# Patient Record
Sex: Female | Born: 1955 | Race: White | Hispanic: No | Marital: Married | Smoking: Never smoker
Health system: Southern US, Community
[De-identification: ages and names within clinical notes are randomized; demographics above are authoritative.]

## PROBLEM LIST (undated history)

## (undated) DIAGNOSIS — R519 Headache, unspecified: Secondary | ICD-10-CM

## (undated) DIAGNOSIS — T7840XA Allergy, unspecified, initial encounter: Secondary | ICD-10-CM

## (undated) DIAGNOSIS — E119 Type 2 diabetes mellitus without complications: Secondary | ICD-10-CM

## (undated) DIAGNOSIS — Z01419 Encounter for gynecological examination (general) (routine) without abnormal findings: Secondary | ICD-10-CM

## (undated) DIAGNOSIS — I10 Essential (primary) hypertension: Secondary | ICD-10-CM

## (undated) DIAGNOSIS — M545 Low back pain, unspecified: Secondary | ICD-10-CM

## (undated) DIAGNOSIS — C4491 Basal cell carcinoma of skin, unspecified: Secondary | ICD-10-CM

## (undated) DIAGNOSIS — E782 Mixed hyperlipidemia: Secondary | ICD-10-CM

## (undated) DIAGNOSIS — M1990 Unspecified osteoarthritis, unspecified site: Secondary | ICD-10-CM

## (undated) DIAGNOSIS — R51 Headache: Secondary | ICD-10-CM

## (undated) DIAGNOSIS — Z8701 Personal history of pneumonia (recurrent): Secondary | ICD-10-CM

## (undated) HISTORY — PX: OTHER SURGICAL HISTORY: SHX169

## (undated) HISTORY — DX: Mixed hyperlipidemia: E78.2

## (undated) HISTORY — DX: Personal history of pneumonia (recurrent): Z87.01

## (undated) HISTORY — PX: CHOLECYSTECTOMY: SHX55

## (undated) HISTORY — DX: Basal cell carcinoma of skin, unspecified: C44.91

## (undated) HISTORY — PX: APPENDECTOMY: SHX54

## (undated) HISTORY — DX: Allergy, unspecified, initial encounter: T78.40XA

## (undated) HISTORY — PX: TONSILLECTOMY: SUR1361

## (undated) HISTORY — DX: Low back pain: M54.5

## (undated) HISTORY — DX: Essential (primary) hypertension: I10

## (undated) HISTORY — DX: Type 2 diabetes mellitus without complications: E11.9

## (undated) HISTORY — DX: Low back pain, unspecified: M54.50

## (undated) HISTORY — DX: Unspecified osteoarthritis, unspecified site: M19.90

---
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
## 1991-09-01 HISTORY — PX: TUBAL LIGATION: SHX77

## 2003-09-01 HISTORY — PX: THYROID SURGERY: SHX805

## 2010-08-31 HISTORY — PX: PLANTAR FASCIA SURGERY: SHX746

## 2013-08-22 ENCOUNTER — Ambulatory Visit (INDEPENDENT_AMBULATORY_CARE_PROVIDER_SITE_OTHER): Payer: BC Managed Care – HMO | Admitting: Obstetrics & Gynecology

## 2013-08-22 ENCOUNTER — Encounter: Payer: Self-pay | Admitting: Obstetrics & Gynecology

## 2013-08-22 VITALS — BP 114/76 | HR 69 | Resp 16 | Ht 67.0 in | Wt 203.0 lb

## 2013-08-22 DIAGNOSIS — Z01419 Encounter for gynecological examination (general) (routine) without abnormal findings: Secondary | ICD-10-CM | POA: Insufficient documentation

## 2013-08-22 DIAGNOSIS — E119 Type 2 diabetes mellitus without complications: Secondary | ICD-10-CM | POA: Insufficient documentation

## 2013-08-22 DIAGNOSIS — E1159 Type 2 diabetes mellitus with other circulatory complications: Secondary | ICD-10-CM

## 2013-08-22 DIAGNOSIS — I152 Hypertension secondary to endocrine disorders: Secondary | ICD-10-CM

## 2013-08-22 DIAGNOSIS — I10 Essential (primary) hypertension: Secondary | ICD-10-CM | POA: Insufficient documentation

## 2013-08-22 HISTORY — DX: Encounter for gynecological examination (general) (routine) without abnormal findings: Z01.419

## 2013-08-22 NOTE — Patient Instructions (Signed)
Mammography  Mammography is an X-ray of the breasts to look for changes that are not normal. The X-ray image is called a mammogram. This procedure can screen for breast cancer, can detect cancer early, and can diagnose cancer.   LET YOUR CAREGIVER KNOW ABOUT:  · Breast implants.  · Previous breast disease, biopsy, or surgery.  · If you are breastfeeding.  · Medicines taken, including vitamins, herbs, eyedrops, over-the-counter medicines, and creams.  · Use of steroids (by mouth or creams).  · Possibility of pregnancy, if this applies.  RISKS AND COMPLICATIONS  · Exposure to radiation, but at very low levels.  · The results may be misinterpreted.  · The results may not be accurate.  · Mammography may lead to further tests.  · Mammography may not catch certain cancers.  BEFORE THE PROCEDURE  · Schedule your test about 7 days after your menstrual period. This is when your breasts are the least tender and have signs of hormone changes.  · If you have had a mammography done at a different facility in the past, get the mammogram X-rays or have them sent to your current exam facility in order to compare them.  · Wash your breasts and under your arms the day of the test.  · Do not wear deodorants, perfumes, or powders anywhere on your body.  · Wear clothes that you can change in and out of easily.  PROCEDURE  Relax as much as possible during the test. Any discomfort during the test will be very brief. The test should take less than 30 minutes. The following will happen:  · You will undress from the waist up and put on a gown.  · You will stand in front of the X-ray machine.  · Each breast will be placed between 2 plastic or glass plates. The plates will compress your breast for a few seconds.  · X-rays will be taken from different angles of the breast.  AFTER THE PROCEDURE  · The mammogram will be examined.  · Depending on the quality of the images, you may need to repeat certain parts of the test.  · Ask when your test  results will be ready. Make sure you get your test results.  · You may resume normal activities.  Document Released: 08/14/2000 Document Revised: 11/09/2011 Document Reviewed: 06/07/2011  Exit[employer]® Patient Information ©2014 [employer].

## 2013-08-22 NOTE — Progress Notes (Signed)
  Subjective:    Lauren Shannon is a 57 y.o. female who presents for an annual exam. The patient has no complaints today. The patient is sexually active. GYN screening history: last pap: was normal. The patient wears seatbelts: yes. The patient participates in regular exercise: not asked. Has the patient ever been transfused or tattooed?: no. The patient reports that there is not domestic violence in her life.   Menstrual History: OB History   Grav Para Term Preterm Abortions TAB SAB Ect Mult Living   4    2 2    2        No LMP recorded. Patient is postmenopausal.    The following portions of the patient's history were reviewed and updated as appropriate: allergies, current medications, past family history, past medical history, past social history, past surgical history and problem list.  Review of Systems A comprehensive review of systems was negative.    Objective:    BP 114/76  Pulse 69  Resp 16  Ht 5\' 7"  (1.702 m)  Wt 203 lb (92.08 kg)  BMI 31.79 kg/m2  General Appearance:    Alert, cooperative, no distress, appears stated age  Head:    Normocephalic, without obvious abnormality, atraumatic        Nose:   Nares normal, septum midline, mucosa normal, no drainage    or sinus tenderness  Throat:   Lips, mucosa, and tongue normal; teeth and gums normal  Neck:   Supple, symmetrical, trachea midline, no adenopathy;    thyroid:  no enlargement/tenderness/nodules; no carotid   bruit or JVD  Back:     Symmetric, no curvature, ROM normal, no CVA tenderness  Lungs:     Clear to auscultation bilaterally, respirations unlabored  Chest Wall:    No tenderness or deformity   Heart:    Regular rate and rhythm, S1 and S2 normal, no murmur, rub   or gallop  Breast Exam:    No tenderness, masses, or nipple abnormality  Abdomen:     Soft, non-tender, bowel sounds active all four quadrants,    no masses, no organomegaly  Genitalia:    Normal female without lesion, discharge or tenderness    Extremities:   Extremities normal, atraumatic, no cyanosis or edema  Pulses:   2+ and symmetric all extremities  Skin:   Skin color, texture, turgor normal, no rashes or lesions  Lymph nodes:   Cervical, supraclavicular, and axillary nodes normal     .    Assessment:    Healthy female exam.    Plan:     All questions answered. Mammogram. 4 months. Pap in 4 years.Normal pap and

## 2013-08-31 HISTORY — PX: OTHER SURGICAL HISTORY: SHX169

## 2014-07-02 ENCOUNTER — Encounter: Payer: Self-pay | Admitting: Obstetrics & Gynecology

## 2014-08-14 ENCOUNTER — Encounter: Payer: Self-pay | Admitting: *Deleted

## 2014-09-13 ENCOUNTER — Encounter: Payer: Self-pay | Admitting: Cardiology

## 2014-09-14 ENCOUNTER — Encounter: Payer: Self-pay | Admitting: Cardiology

## 2014-09-14 ENCOUNTER — Ambulatory Visit (INDEPENDENT_AMBULATORY_CARE_PROVIDER_SITE_OTHER): Payer: BC Managed Care – PPO | Admitting: Cardiology

## 2014-09-14 VITALS — BP 104/70 | HR 55 | Ht 65.0 in | Wt 190.0 lb

## 2014-09-14 DIAGNOSIS — E782 Mixed hyperlipidemia: Secondary | ICD-10-CM | POA: Insufficient documentation

## 2014-09-14 DIAGNOSIS — I493 Ventricular premature depolarization: Secondary | ICD-10-CM | POA: Insufficient documentation

## 2014-09-14 DIAGNOSIS — R9431 Abnormal electrocardiogram [ECG] [EKG]: Secondary | ICD-10-CM

## 2014-09-14 DIAGNOSIS — E119 Type 2 diabetes mellitus without complications: Secondary | ICD-10-CM

## 2014-09-14 DIAGNOSIS — I499 Cardiac arrhythmia, unspecified: Secondary | ICD-10-CM | POA: Insufficient documentation

## 2014-09-14 DIAGNOSIS — I10 Essential (primary) hypertension: Secondary | ICD-10-CM

## 2014-09-14 DIAGNOSIS — Z136 Encounter for screening for cardiovascular disorders: Secondary | ICD-10-CM

## 2014-09-14 NOTE — Patient Instructions (Signed)
Your physician recommends that you schedule a follow-up appointment in: to be determined after tests. We will call you with results     Your physician has requested that you have an echocardiogram. Echocardiography is a painless test that uses sound waves to create images of your heart. It provides your doctor with information about the size and shape of your heart and how well your heart's chambers and valves are working. This procedure takes approximately one hour. There are no restrictions for this procedure.      Your physician has requested that you have an exercise tolerance test. For further information please visit [email or web address]. Please also follow instruction sheet, as given.        Thank you for choosing [facility] !

## 2014-09-14 NOTE — Assessment & Plan Note (Signed)
Followed by primary care. She is currently managing this via diet.

## 2014-09-14 NOTE — Assessment & Plan Note (Signed)
Noted on ECG, intermittent sense of heart skip over the last few years, but no definite associated dizziness or syncope. We talked about trying to limit caffeine somewhat to see if this helps. She underwent a remote evaluation greater than 10 years ago for ischemic heart disease, reportedly reassuring, although no follow-up testing since that time. She has diabetes mellitus, hypertension, and hyperlipidemia. Plan at this time is to obtain a GXT, mainly to evaluate for suppression of PVCs with exercise. An echocardiogram will also be obtained to exclude cardiomyopathy. We will follow her with the results.

## 2014-09-14 NOTE — Progress Notes (Signed)
Reason for visit: Irregular heartbeat, PVCs  Clinical Summary Ms. Kolodziejski is a 59 y.o.female referred for cardiology consultation by Mr. Hepler PA-C with [place]. She reports a history of irregular heartbeat, sometimes feels a brief skip when she is quiet and still, present over the last 2-3 years as best she can recall. Heart rate irregularity was noted on a recent routine examination in December 2015 with reported documentation of ventricular bigeminy (ECG not available). She denies any specific exertional chest tightness or unusual, limiting shortness of breath. Also no history of syncope. She states that occasionally she has a brief "unsteadiness" when she is walking, but does not associate this with lightheadedness or palpitations. ECG done today in the office shows sinus rhythm at 72 bpm with occasional PVCs.  Lab work from December 2015 showed BUN 18, creatinine 0.7, potassium 4.6, hemoglobin 14.4, platelets 238, TSH 3.0, hemoglobin A1c 5.9.  She reports no known history of cardiomyopathy or ischemic heart disease. She recalls undergoing a stress test approximately 11 years ago when she lived in [place]. Based on this she ultimately underwent cardiology evaluation and had a heart catheterization at [facility] that was reportedly normal.  She does not take any stimulant medications or supplements. She states that she drinks 3 cups of coffee a day, some tea. She has been trying to lose weight over the last few years. No regular exercise regimen.   No Known Allergies  Current Outpatient Prescriptions  Medication Sig Dispense Refill  . aspirin 81 MG tablet Take 81 mg by mouth daily.    . calcium carbonate (OS-CAL - DOSED IN MG OF ELEMENTAL CALCIUM) 1250 MG tablet Take 1 tablet by mouth daily with breakfast.    . cholecalciferol (VITAMIN D) 1000 UNITS tablet Take 1,000 Units by mouth daily.    Marland Kitchen LIFESCAN FINEPOINT LANCETS MISC     . losartan (COZAAR) 50 MG tablet     .  lovastatin (MEVACOR) 20 MG tablet     . Magnesium 400 MG CAPS Take 400 mg by mouth daily.    . Multiple Vitamin (MULTIVITAMIN) capsule Take 1 capsule by mouth daily.    . ONE TOUCH ULTRA TEST test strip     . RESTASIS 0.05 % ophthalmic emulsion      No current facility-administered medications for this visit.    Past Medical History  Diagnosis Date  . Low back pain   . History of pneumonia 1963  . Mixed hyperlipidemia   . Essential hypertension   . Type 2 diabetes mellitus     Past Surgical History  Procedure Laterality Date  . Thyroid surgery  2005  . Uterine polyp removal    . Tubal ligation  1993  . Cholecystectomy    . Appendectomy      Family History  Problem Relation Age of Onset  . Diabetes Mother   . Hypertension Mother   . Heart disease Father   . Heart disease Brother     Social History Ms. Gillette reports that she has never smoked. She does not have any smokeless tobacco history on file. Ms. Schey reports that she does not drink alcohol.  Review of Systems Complete review of systems negative except as otherwise outlined in the clinical summary and also the following. No fevers or chills. Stable appetite. No orthopnea or PND. No claudication. Sometimes has ankle edema.  Physical Examination Filed Vitals:   09/14/14 0839  BP: 104/70  Pulse: 55   Filed Weights  09/14/14 0839  Weight: 190 lb (86.183 kg)   Overweight woman, appears comfortable at rest. HEENT: Conjunctiva and lids normal, oropharynx clear. Neck: Supple, no elevated JVP or carotid bruits, no thyromegaly. Lungs: Clear to auscultation, nonlabored breathing at rest. Cardiac: Regular rate and rhythm with occasional ectopic beat, no S3 or significant systolic murmur, no pericardial rub. Abdomen: Soft, nontender, bowel sounds present, no guarding or rebound. Extremities: No pitting edema, distal pulses 2+. Skin: Warm and dry. Musculoskeletal: No kyphosis. Neuropsychiatric: Alert and  oriented x3, affect grossly appropriate.   Problem List and Plan   PVC's (premature ventricular contractions) Noted on ECG, intermittent sense of heart skip over the last few years, but no definite associated dizziness or syncope. We talked about trying to limit caffeine somewhat to see if this helps. She underwent a remote evaluation greater than 10 years ago for ischemic heart disease, reportedly reassuring, although no follow-up testing since that time. She has diabetes mellitus, hypertension, and hyperlipidemia. Plan at this time is to obtain a GXT, mainly to evaluate for suppression of PVCs with exercise. An echocardiogram will also be obtained to exclude cardiomyopathy. We will follow her with the results.   Essential hypertension Blood pressure is normal today. She is on Cozaar.   Type 2 diabetes mellitus Followed by primary care. She is currently managing this via diet.   Mixed hyperlipidemia She is on Mevacor.     Satira Sark, M.D., F.A.C.C.

## 2014-09-14 NOTE — Assessment & Plan Note (Signed)
She is on Mevacor.

## 2014-09-14 NOTE — Assessment & Plan Note (Signed)
Blood pressure is normal today. She is on Cozaar.

## 2014-09-16 ENCOUNTER — Encounter: Payer: Self-pay | Admitting: Cardiology

## 2014-09-20 ENCOUNTER — Ambulatory Visit (HOSPITAL_COMMUNITY)
Admission: RE | Admit: 2014-09-20 | Discharge: 2014-09-20 | Disposition: A | Discharge status: Home / Self Care | Payer: BC Managed Care – PPO | Source: Ambulatory Visit | Attending: Cardiology | Admitting: Cardiology

## 2014-09-20 ENCOUNTER — Ambulatory Visit (HOSPITAL_COMMUNITY)
Admission: RE | Admit: 2014-09-20 | Discharge: 2014-09-20 | Disposition: A | Discharge status: Home / Self Care | Payer: BLUE CROSS/BLUE SHIELD | Source: Ambulatory Visit | Attending: Cardiology | Admitting: Cardiology

## 2014-09-20 ENCOUNTER — Encounter (HOSPITAL_COMMUNITY): Payer: Self-pay

## 2014-09-20 ENCOUNTER — Telehealth: Payer: Self-pay

## 2014-09-20 ENCOUNTER — Encounter: Payer: Self-pay | Admitting: Cardiology

## 2014-09-20 DIAGNOSIS — E785 Hyperlipidemia, unspecified: Secondary | ICD-10-CM | POA: Diagnosis not present

## 2014-09-20 DIAGNOSIS — I10 Essential (primary) hypertension: Secondary | ICD-10-CM | POA: Insufficient documentation

## 2014-09-20 DIAGNOSIS — I493 Ventricular premature depolarization: Secondary | ICD-10-CM | POA: Insufficient documentation

## 2014-09-20 DIAGNOSIS — E119 Type 2 diabetes mellitus without complications: Secondary | ICD-10-CM | POA: Diagnosis not present

## 2014-09-20 DIAGNOSIS — R9431 Abnormal electrocardiogram [ECG] [EKG]: Secondary | ICD-10-CM

## 2014-09-20 DIAGNOSIS — I4949 Other premature depolarization: Secondary | ICD-10-CM

## 2014-09-20 NOTE — Telephone Encounter (Signed)
-----   Message from Satira Sark, MD sent at 09/20/2014 11:57 AM EST ----- Please see the GXT report (under documentation encounter). Negative for ischemia and her PVCs resolved at higher heart rates. Overall reassuring. Doubt that we need to pursue further workup at this time.

## 2014-09-20 NOTE — Telephone Encounter (Signed)
Pt notified of results,copy to pcp

## 2014-09-20 NOTE — Progress Notes (Signed)
  Echocardiogram 2D Echocardiogram has been performed.  [place], Madill 09/20/2014, 9:14 AM

## 2014-09-20 NOTE — Progress Notes (Signed)
Stress Lab Nurses Notes - Lauren Shannon 09/20/2014 Reason for doing test: Irregular heartbeat, PVCs Type of test: Regular GTX Nurse performing test: Gerrit Halls, RN Nuclear Medicine Tech: Not Applicable Echo Tech: Not Applicable MD performing test: S. McDowell/K.Purcell Nails NP Family MD: Michaela Corner Test explained and consent signed: Yes.   IV started: No IV started Symptoms: fatigue Treatment/Intervention: None Reason test stopped: fatigue After recovery IV was: NA Patient to return to Nuc. Med at : NA Patient discharged: Home Patient's Condition upon discharge was: stable Comments: During test peak BP 170/75 & HR 166.  Recovery BP 113/74 & HR 92.  Symptoms resolved in recovery. [name] T

## 2014-09-20 NOTE — Progress Notes (Signed)
Stress Lab Nurses Notes - Jeny Nield 09/20/2014 Reason for doing test: Irregular heartbeat, PVCs Type of test: Regular GTX Nurse performing test: Gerrit Halls, RN Nuclear Medicine Tech: Not Applicable Echo Tech: Not Applicable MD performing test: S. McDowell/K.Purcell Nails NP Family MD: Michaela Corner Test explained and consent signed: Yes.  IV started: No IV started Symptoms: Fatigue Treatment/Intervention: None Reason test stopped: Fatigue After recovery IV was: NA Patient to return to Nuc. Med at : NA Patient discharged: Home Patient's Condition upon discharge was: Stable Comments: During test peak BP 170/75 & HR 166. Recovery BP 113/74 & HR 92. Symptoms resolved in recovery. [name] T   Attending note:  Patient exercised on Bruce protocol for 6:04 achieving 7.2 METS, peak heart rate 166 BPM which was 103% MPHR. Peak blood pressure 170/75. No chest pain reported. Baseline tracing showed NSR with two PVCs. There were occasional to frequent PVCs in stage 1 that resolved in stage 2 and then recurred in recovery. No sustained arrhythmias. No diagnostic ST segment abnormalities. Low risk Duke treadmill score of 6.  Satira Sark, M.D., F.A.C.C.

## 2014-09-27 ENCOUNTER — Ambulatory Visit: Payer: Self-pay | Admitting: Cardiovascular Disease

## 2015-01-17 ENCOUNTER — Other Ambulatory Visit (HOSPITAL_COMMUNITY)
Admission: RE | Admit: 2015-01-17 | Discharge: 2015-01-17 | Disposition: A | Discharge status: Home / Self Care | Payer: 59 | Source: Ambulatory Visit | Attending: Obstetrics & Gynecology | Admitting: Obstetrics & Gynecology

## 2015-01-17 ENCOUNTER — Encounter: Payer: Self-pay | Admitting: Obstetrics & Gynecology

## 2015-01-17 ENCOUNTER — Ambulatory Visit (INDEPENDENT_AMBULATORY_CARE_PROVIDER_SITE_OTHER): Payer: 59 | Admitting: Obstetrics & Gynecology

## 2015-01-17 VITALS — BP 112/60 | HR 68 | Ht 65.2 in | Wt 190.0 lb

## 2015-01-17 DIAGNOSIS — Z1211 Encounter for screening for malignant neoplasm of colon: Secondary | ICD-10-CM

## 2015-01-17 DIAGNOSIS — Z01419 Encounter for gynecological examination (general) (routine) without abnormal findings: Secondary | ICD-10-CM | POA: Insufficient documentation

## 2015-01-17 DIAGNOSIS — Z1151 Encounter for screening for human papillomavirus (HPV): Secondary | ICD-10-CM | POA: Insufficient documentation

## 2015-01-17 DIAGNOSIS — Z1212 Encounter for screening for malignant neoplasm of rectum: Secondary | ICD-10-CM | POA: Diagnosis not present

## 2015-01-17 NOTE — Progress Notes (Signed)
Patient ID: Lauren Shannon, female   DOB: Mar 22, 1956, 59 y.o.   MRN: [id number] Subjective:     Lauren Shannon is a 59 y.o. female here for a routine exam.  No LMP recorded. Patient is postmenopausal. [id number] Birth Control Method:  Post menopausal Menstrual Calendar(currently): amenorrheic  Current complaints: none.   Current acute medical issues:  none   Recent Gynecologic History No LMP recorded. Patient is postmenopausal. Last Pap: 2014,  normal Last mammogram: 2016,  normal  Past Medical History  Diagnosis Date  . Low back pain   . History of pneumonia 1963  . Mixed hyperlipidemia   . Essential hypertension   . Type 2 diabetes mellitus     Past Surgical History  Procedure Laterality Date  . Thyroid surgery  2005  . Uterine polyp removal    . Tubal ligation  1993  . Cholecystectomy    . Appendectomy      OB History    Gravida Para Term Preterm AB TAB SAB Ectopic Multiple Living   4    2 2    2       History   Social History  . Marital Status: Married    Spouse Name: N/A  . Number of Children: N/A  . Years of Education: N/A   Social History Main Topics  . Smoking status: Never Smoker   . Smokeless tobacco: Not on file  . Alcohol Use: No  . Drug Use: No  . Sexual Activity:    Partners: Male   Other Topics Concern  . None   Social History Narrative    Family History  Problem Relation Age of Onset  . Diabetes Mother   . Hypertension Mother   . Heart disease Father   . Heart disease Brother      Current outpatient prescriptions:  .  aspirin 81 MG tablet, Take 81 mg by mouth daily., Disp: , Rfl:  .  calcium carbonate (OS-CAL - DOSED IN MG OF ELEMENTAL CALCIUM) 1250 MG tablet, Take 1 tablet by mouth daily with breakfast., Disp: , Rfl:  .  cholecalciferol (VITAMIN D) 1000 UNITS tablet, Take 1,000 Units by mouth daily., Disp: , Rfl:  .  losartan (COZAAR) 50 MG tablet, , Disp: , Rfl:  .  lovastatin (MEVACOR) 20 MG tablet, , Disp: , Rfl:  .  Magnesium 400  MG CAPS, Take 400 mg by mouth daily., Disp: , Rfl:  .  Multiple Vitamin (MULTIVITAMIN) capsule, Take 1 capsule by mouth daily., Disp: , Rfl:  .  RESTASIS 0.05 % ophthalmic emulsion, , Disp: , Rfl:  .  LIFESCAN FINEPOINT LANCETS MISC, , Disp: , Rfl:  .  ONE TOUCH ULTRA TEST test strip, , Disp: , Rfl:   Review of Systems  Review of Systems  Constitutional: Negative for fever, chills, weight loss, malaise/fatigue and diaphoresis.  HENT: Negative for hearing loss, ear pain, nosebleeds, congestion, sore throat, neck pain, tinnitus and ear discharge.   Eyes: Negative for blurred vision, double vision, photophobia, pain, discharge and redness.  Respiratory: Negative for cough, hemoptysis, sputum production, shortness of breath, wheezing and stridor.   Cardiovascular: Negative for chest pain, palpitations, orthopnea, claudication, leg swelling and PND.  Gastrointestinal: negative for abdominal pain. Negative for heartburn, nausea, vomiting, diarrhea, constipation, blood in stool and melena.  Genitourinary: Negative for dysuria, urgency, frequency, hematuria and flank pain.  Musculoskeletal: Negative for myalgias, back pain, joint pain and falls.  Skin: Negative for itching and rash.  Neurological: Negative for dizziness, tingling, tremors,  sensory change, speech change, focal weakness, seizures, loss of consciousness, weakness and headaches.  Endo/Heme/Allergies: Negative for environmental allergies and polydipsia. Does not bruise/bleed easily.  Psychiatric/Behavioral: Negative for depression, suicidal ideas, hallucinations, memory loss and substance abuse. The patient is not nervous/anxious and does not have insomnia.        Objective:  Blood pressure 112/60, pulse 68, height 5' 5.2" (1.656 m), weight 190 lb (86.183 kg).   Physical Exam  Vitals reviewed. Constitutional: She is oriented to person, place, and time. She appears well-developed and well-nourished.  HENT:  Head: Normocephalic and  atraumatic.        Right Ear: External ear normal.  Left Ear: External ear normal.  Nose: Nose normal.  Mouth/Throat: Oropharynx is clear and moist.  Eyes: Conjunctivae and EOM are normal. Pupils are equal, round, and reactive to light. Right eye exhibits no discharge. Left eye exhibits no discharge. No scleral icterus.  Neck: Normal range of motion. Neck supple. No tracheal deviation present. No thyromegaly present.  Cardiovascular: Normal rate, regular rhythm, normal heart sounds and intact distal pulses.  Exam reveals no gallop and no friction rub.   No murmur heard. Respiratory: Effort normal and breath sounds normal. No respiratory distress. She has no wheezes. She has no rales. She exhibits no tenderness.  GI: Soft. Bowel sounds are normal. She exhibits no distension and no mass. There is no tenderness. There is no rebound and no guarding.  Genitourinary:  Breasts no masses skin changes or nipple changes bilaterally      Vulva is normal without lesions Vagina is pink moist without discharge Cervix normal in appearance and pap is done Uterus is normal size shape and contour Adnexa is negative with normal sized ovaries  {Rectal    hemoccult negative, normal tone, no masses  Musculoskeletal: Normal range of motion. She exhibits no edema and no tenderness.  Neurological: She is alert and oriented to person, place, and time. She has normal reflexes. She displays normal reflexes. No cranial nerve deficit. She exhibits normal muscle tone. Coordination normal.  Skin: Skin is warm and dry. No rash noted. No erythema. No pallor.  Psychiatric: She has a normal mood and affect. Her behavior is normal. Judgment and thought content normal.       Assessment:    Healthy female exam.    Plan:    Mammogram ordered. Follow up in: 1 year.    If normal no pap needed for 3 years

## 2015-01-21 LAB — CYTOLOGY - PAP

## 2015-01-29 ENCOUNTER — Other Ambulatory Visit: Payer: Self-pay | Admitting: Obstetrics & Gynecology

## 2015-01-29 DIAGNOSIS — Z1231 Encounter for screening mammogram for malignant neoplasm of breast: Secondary | ICD-10-CM

## 2015-01-31 ENCOUNTER — Ambulatory Visit (HOSPITAL_COMMUNITY)
Admission: RE | Admit: 2015-01-31 | Discharge: 2015-01-31 | Disposition: A | Discharge status: Home / Self Care | Payer: 59 | Source: Ambulatory Visit | Attending: Obstetrics & Gynecology | Admitting: Obstetrics & Gynecology

## 2015-01-31 DIAGNOSIS — Z1231 Encounter for screening mammogram for malignant neoplasm of breast: Secondary | ICD-10-CM

## 2015-02-07 ENCOUNTER — Encounter: Payer: Self-pay | Admitting: Obstetrics & Gynecology

## 2015-09-05 ENCOUNTER — Telehealth: Payer: Self-pay | Admitting: Family Medicine

## 2015-09-05 ENCOUNTER — Ambulatory Visit (INDEPENDENT_AMBULATORY_CARE_PROVIDER_SITE_OTHER): Payer: 59 | Admitting: Family Medicine

## 2015-09-05 ENCOUNTER — Encounter: Payer: Self-pay | Admitting: Family Medicine

## 2015-09-05 VITALS — BP 116/78 | HR 99 | Temp 100.6°F | Ht 65.2 in | Wt 190.8 lb

## 2015-09-05 DIAGNOSIS — I10 Essential (primary) hypertension: Secondary | ICD-10-CM | POA: Diagnosis not present

## 2015-09-05 DIAGNOSIS — I493 Ventricular premature depolarization: Secondary | ICD-10-CM

## 2015-09-05 DIAGNOSIS — E782 Mixed hyperlipidemia: Secondary | ICD-10-CM | POA: Diagnosis not present

## 2015-09-05 DIAGNOSIS — I499 Cardiac arrhythmia, unspecified: Secondary | ICD-10-CM

## 2015-09-05 DIAGNOSIS — J029 Acute pharyngitis, unspecified: Secondary | ICD-10-CM | POA: Diagnosis not present

## 2015-09-05 DIAGNOSIS — R509 Fever, unspecified: Secondary | ICD-10-CM

## 2015-09-05 DIAGNOSIS — R6883 Chills (without fever): Secondary | ICD-10-CM

## 2015-09-05 DIAGNOSIS — J0100 Acute maxillary sinusitis, unspecified: Secondary | ICD-10-CM

## 2015-09-05 DIAGNOSIS — E119 Type 2 diabetes mellitus without complications: Secondary | ICD-10-CM

## 2015-09-05 LAB — POCT INFLUENZA A/B
Influenza A, POC: NEGATIVE
Influenza B, POC: NEGATIVE

## 2015-09-05 LAB — POCT RAPID STREP A (OFFICE): Rapid Strep A Screen: NEGATIVE

## 2015-09-05 MED ORDER — CEFDINIR 300 MG PO CAPS: 300.0000 mg | ORAL_CAPSULE | Freq: Two times a day (BID) | ORAL | Status: DC

## 2015-09-05 NOTE — Patient Instructions (Signed)
Great to meet you!  I am treating you specifically for sinusitis but the medication will also easily treat pneumonia or bronchitis if you have it developing.   Lets plan to see you back in 3 months for follow up of diabetes and HTN  Sinusitis, Adult Sinusitis is redness, soreness, and inflammation of the paranasal sinuses. Paranasal sinuses are air pockets within the bones of your face. They are located beneath your eyes, in the middle of your forehead, and above your eyes. In healthy paranasal sinuses, mucus is able to drain out, and air is able to circulate through them by way of your nose. However, when your paranasal sinuses are inflamed, mucus and air can become trapped. This can allow bacteria and other germs to grow and cause infection. Sinusitis can develop quickly and last only a short time (acute) or continue over a long period (chronic). Sinusitis that lasts for more than 12 weeks is considered chronic. CAUSES Causes of sinusitis include:  Allergies.  Structural abnormalities, such as displacement of the cartilage that separates your nostrils (deviated septum), which can decrease the air flow through your nose and sinuses and affect sinus drainage.  Functional abnormalities, such as when the small hairs (cilia) that line your sinuses and help remove mucus do not work properly or are not present. SIGNS AND SYMPTOMS Symptoms of acute and chronic sinusitis are the same. The primary symptoms are pain and pressure around the affected sinuses. Other symptoms include:  Upper toothache.  Earache.  Headache.  Bad breath.  Decreased sense of smell and taste.  A cough, which worsens when you are lying flat.  Fatigue.  Fever.  Thick drainage from your nose, which often is green and may contain pus (purulent).  Swelling and warmth over the affected sinuses. DIAGNOSIS Your health care provider will perform a physical exam. During your exam, your health care provider may perform  any of the following to help determine if you have acute sinusitis or chronic sinusitis:  Look in your nose for signs of abnormal growths in your nostrils (nasal polyps).  Tap over the affected sinus to check for signs of infection.  View the inside of your sinuses using an imaging device that has a light attached (endoscope). If your health care provider suspects that you have chronic sinusitis, one or more of the following tests may be recommended:  Allergy tests.  Nasal culture. A sample of mucus is taken from your nose, sent to a lab, and screened for bacteria.  Nasal cytology. A sample of mucus is taken from your nose and examined by your health care provider to determine if your sinusitis is related to an allergy. TREATMENT Most cases of acute sinusitis are related to a viral infection and will resolve on their own within 10 days. Sometimes, medicines are prescribed to help relieve symptoms of both acute and chronic sinusitis. These may include pain medicines, decongestants, nasal steroid sprays, or saline sprays. However, for sinusitis related to a bacterial infection, your health care provider will prescribe antibiotic medicines. These are medicines that will help kill the bacteria causing the infection. Rarely, sinusitis is caused by a fungal infection. In these cases, your health care provider will prescribe antifungal medicine. For some cases of chronic sinusitis, surgery is needed. Generally, these are cases in which sinusitis recurs more than 3 times per year, despite other treatments. HOME CARE INSTRUCTIONS  Drink plenty of water. Water helps thin the mucus so your sinuses can drain more easily.  Use a  humidifier.  Inhale steam 3-4 times a day (for example, sit in the bathroom with the shower running).  Apply a warm, moist washcloth to your face 3-4 times a day, or as directed by your health care provider.  Use saline nasal sprays to help moisten and clean your  sinuses.  Take medicines only as directed by your health care provider.  If you were prescribed either an antibiotic or antifungal medicine, finish it all even if you start to feel better. SEEK IMMEDIATE MEDICAL CARE IF:  You have increasing pain or severe headaches.  You have nausea, vomiting, or drowsiness.  You have swelling around your face.  You have vision problems.  You have a stiff neck.  You have difficulty breathing.   This information is not intended to replace advice given to you by your health care provider. Make sure you discuss any questions you have with your health care provider.   Document Released: 08/17/2005 Document Revised: 09/07/2014 Document Reviewed: 09/01/2011 Elsevier Interactive Patient Education [employer].

## 2015-09-05 NOTE — Progress Notes (Signed)
   HPI  Patient presents today  with cough and cold symptoms and to establish care  Patient has been going to [place] [facility]  She has a Hx of Diet controlled DM2 and DM2 Her only regular meds now are lovastatin, restasis, and asa  Cough/cold Subjective fever, malaise, cough, anorexia, sore throat, and chills for 2 days Denies difficulty breathing or chest pain Tolerating fluids Also rhinorrhea  HCM Mammo in June of last year C scope 6-7 years ago, due again Flu shot this last fall   PMH: Smoking status noted Her past medical, surgical, social, and family Hx were reviewed and updated in EMR ROS: Per HPI  Objective: BP 116/78 mmHg  Pulse 99  Temp(Src) 100.6 F (38.1 C) (Oral)  Ht 5' 5.2" (1.656 m)  Wt 190 lb 12.8 oz (86.546 kg)  BMI 31.56 kg/m2 Gen: NAD, alert, cooperative with exam HEENT: NCAT, R sided maxillary tenderness, oropharynx clear, TMs WNL BL CV: RRR, good S1/S2, no murmur Resp: CTABL, no wheezes, non-labored Ext: trace pitting edema BL Neuro: Alert and oriented, No gross deficits  Assessment and plan:  # Acute sinusitis Treating with omnicef RTC if worsening or does not improve as expected  # HTN Controlled with theraputic lifestyle changes No meds Labs UTD per last PCP, Request records  # HLD On lovastatin  DM2 Reports last A1C 5.5, diet controlled, repeat in 3 months then plan Q 6 month  Dry eyes Restasis  PVCs No Intervention for now     Orders Placed This Encounter  Procedures  . Culture, Group A Strep  . POCT Influenza A/B  . POCT rapid strep A    Meds ordered this encounter  Medications  . cefdinir (OMNICEF) 300 MG capsule    Sig: Take 1 capsule (300 mg total) by mouth 2 (two) times daily. 1 po BID    Dispense:  20 capsule    Refill:  0    Laroy Apple, MD [facility] 09/05/2015, 4:21 PM

## 2015-09-07 LAB — CULTURE, GROUP A STREP: Strep A Culture: NEGATIVE

## 2015-09-10 ENCOUNTER — Encounter: Payer: Self-pay | Admitting: Family Medicine

## 2015-09-10 ENCOUNTER — Encounter: Payer: Self-pay | Admitting: *Deleted

## 2015-09-10 ENCOUNTER — Ambulatory Visit (INDEPENDENT_AMBULATORY_CARE_PROVIDER_SITE_OTHER): Payer: 59 | Admitting: Family Medicine

## 2015-09-10 VITALS — BP 126/75 | HR 87 | Temp 97.0°F | Ht 65.2 in | Wt 189.0 lb

## 2015-09-10 DIAGNOSIS — J209 Acute bronchitis, unspecified: Secondary | ICD-10-CM | POA: Diagnosis not present

## 2015-09-10 NOTE — Progress Notes (Signed)
   Subjective:    Patient ID: Lauren Shannon, female    DOB: 1956/07/13, 60 y.o.   MRN: [id number]  HPI 60 year old female who was seen here last week for sinusitis and given Omnicef and told take Mucinex. She also takes DayQuil the daytime and NyQuil at night for cough suppression. Today she feels like the infection has spread more to her chest. She is having more productive cough and more wheezing. There is no history of asthma or COPD. She's had no fever or chills. She continues to take [facility] as prescribed.    Review of Systems  Constitutional: Negative.   HENT: Positive for congestion and postnasal drip.   Respiratory: Positive for cough and wheezing.   Cardiovascular: Negative.    Patient Active Problem List   Diagnosis Date Noted  . Mixed hyperlipidemia 09/14/2014  . PVC's (premature ventricular contractions) 09/14/2014  . Routine gynecological examination 08/22/2013  . Type 2 diabetes mellitus ([place]) 08/22/2013  . Essential hypertension 08/22/2013   Outpatient Encounter Prescriptions as of 09/10/2015  Medication Sig  . aspirin 81 MG tablet Take 81 mg by mouth daily.  . calcium carbonate (OS-CAL - DOSED IN MG OF ELEMENTAL CALCIUM) 1250 MG tablet Take 1 tablet by mouth daily with breakfast.  . cefdinir (OMNICEF) 300 MG capsule Take 1 capsule (300 mg total) by mouth 2 (two) times daily. 1 po BID  . cholecalciferol (VITAMIN D) 1000 UNITS tablet Take 1,000 Units by mouth daily.  Marland Kitchen LIFESCAN FINEPOINT LANCETS MISC   . lovastatin (MEVACOR) 20 MG tablet 40 mg.   . Magnesium 400 MG CAPS Take 400 mg by mouth daily.  . Multiple Vitamin (MULTIVITAMIN) capsule Take 1 capsule by mouth daily.  . ONE TOUCH ULTRA TEST test strip   . RESTASIS 0.05 % ophthalmic emulsion    No facility-administered encounter medications on file as of 09/10/2015.       Objective:   Physical Exam  Constitutional: She appears well-developed and well-nourished.  HENT:  Head: Normocephalic.  Cardiovascular:  Normal rate and regular rhythm.   Pulmonary/Chest: Effort normal. She has wheezes. She has rales.          Assessment & Plan:  1. Acute bronchitis, unspecified organism Sinus has now evolved to bronchitis. I think she should continue on with Omnicef for another 5 days. Continue Mucinex. Today I gave her a sample of Brio. We used it while she was in the room to instruct her on proper usage. Continue to drink plenty of fluids. Given a work note for 2 days last week and 3 days this week to be out.  Wardell Honour MD

## 2015-09-13 NOTE — Telephone Encounter (Signed)
Patient calls and gives name of former doctor as mark hepler.. She mailed the records release to his office on 09/12/15

## 2015-09-13 NOTE — Telephone Encounter (Signed)
Pt records have been received from [facility]

## 2015-09-20 ENCOUNTER — Encounter: Payer: Self-pay | Admitting: Family Medicine

## 2015-09-20 ENCOUNTER — Ambulatory Visit (INDEPENDENT_AMBULATORY_CARE_PROVIDER_SITE_OTHER): Payer: 59 | Admitting: Family Medicine

## 2015-09-20 VITALS — BP 117/74 | HR 95 | Temp 100.6°F | Ht 65.2 in | Wt 188.0 lb

## 2015-09-20 DIAGNOSIS — J0100 Acute maxillary sinusitis, unspecified: Secondary | ICD-10-CM | POA: Diagnosis not present

## 2015-09-20 MED ORDER — AMOXICILLIN-POT CLAVULANATE 875-125 MG PO TABS: 1.0000 | ORAL_TABLET | Freq: Two times a day (BID) | ORAL | Status: DC

## 2015-09-20 NOTE — Addendum Note (Signed)
Addended by: Thana Ates on: 09/20/2015 04:24 PM   Modules accepted: Orders

## 2015-09-20 NOTE — Progress Notes (Signed)
   Subjective:    Patient ID: Lauren Shannon, female    DOB: May 26, 1956, 60 y.o.   MRN: [id number]  HPI Patient here today for 3 rd visit regarding her cough and congestion. She completed antibiotic and in the last 48 hours has started getting worse again. This person has had respiratory symptoms for about the past 2-1/2 weeks. She recently completed a course of Omnicef and seemed to improve but once antibiotic stopped she got worse. She is coughing up greenish sputum and feels drainage from her sinuses. There is some pain and pressure in her sinuses. She is still using the sample inhaler I gave her and that helps her breathing.   Patient Active Problem List   Diagnosis Date Noted  . Mixed hyperlipidemia 09/14/2014  . PVC's (premature ventricular contractions) 09/14/2014  . Routine gynecological examination 08/22/2013  . Type 2 diabetes mellitus ([place]) 08/22/2013  . Essential hypertension 08/22/2013   Outpatient Encounter Prescriptions as of 09/20/2015  Medication Sig  . aspirin 81 MG tablet Take 81 mg by mouth daily.  . calcium carbonate (OS-CAL - DOSED IN MG OF ELEMENTAL CALCIUM) 1250 MG tablet Take 1 tablet by mouth daily with breakfast.  . cholecalciferol (VITAMIN D) 1000 UNITS tablet Take 1,000 Units by mouth daily.  Marland Kitchen LIFESCAN FINEPOINT LANCETS MISC   . lovastatin (MEVACOR) 20 MG tablet 40 mg.   . Magnesium 400 MG CAPS Take 400 mg by mouth daily.  . Multiple Vitamin (MULTIVITAMIN) capsule Take 1 capsule by mouth daily.  . ONE TOUCH ULTRA TEST test strip   . RESTASIS 0.05 % ophthalmic emulsion   . [DISCONTINUED] cefdinir (OMNICEF) 300 MG capsule Take 1 capsule (300 mg total) by mouth 2 (two) times daily. 1 po BID   No facility-administered encounter medications on file as of 09/20/2015.       Review of Systems  Constitutional: Positive for fever and fatigue.  HENT: Positive for congestion.   Eyes: Negative.   Respiratory: Positive for cough.   Cardiovascular: Negative.     Gastrointestinal: Negative.   Endocrine: Negative.   Genitourinary: Negative.   Musculoskeletal: Positive for myalgias.  Skin: Negative.   Allergic/Immunologic: Negative.   Neurological: Negative.   Hematological: Negative.   Psychiatric/Behavioral: Negative.        Objective:   Physical Exam  Constitutional: She appears well-developed and well-nourished.  HENT:  Sinuses are tender There is evidence in her throat of postnasal drainage.  Cardiovascular: Normal rate and regular rhythm.   Pulmonary/Chest: Effort normal and breath sounds normal. She has no wheezes. She has no rales.    BP 117/74 mmHg  Pulse 95  Temp(Src) 100.6 F (38.1 C) (Oral)  Ht 5' 5.2" (1.656 m)  Wt 188 lb (85.276 kg)  BMI 31.10 kg/m2       Assessment & Plan:  1. Acute maxillary sinusitis, recurrence not specified I thought she had a bronchial infection at her last visit but today it seems more in her head and sinuses with drainage and resultant cough. Will treat with Augmentin 875 twice a day. Also suggested Nettie pot along with Mucinex and plenty of fluids  Wardell Honour MD

## 2015-10-14 ENCOUNTER — Encounter: Payer: Self-pay | Admitting: Family Medicine

## 2015-10-14 ENCOUNTER — Ambulatory Visit (INDEPENDENT_AMBULATORY_CARE_PROVIDER_SITE_OTHER): Payer: 59 | Admitting: Family Medicine

## 2015-10-14 VITALS — BP 139/81 | HR 74 | Temp 98.1°F | Ht 65.2 in | Wt 191.0 lb

## 2015-10-14 DIAGNOSIS — J019 Acute sinusitis, unspecified: Secondary | ICD-10-CM | POA: Diagnosis not present

## 2015-10-14 MED ORDER — FLUTICASONE PROPIONATE 50 MCG/ACT NA SUSP: 1.0000 | Freq: Two times a day (BID) | NASAL | Status: DC | PRN

## 2015-10-14 NOTE — Progress Notes (Signed)
BP 139/81 mmHg  Pulse 74  Temp(Src) 98.1 F (36.7 C) (Oral)  Ht 5' 5.2" (1.656 m)  Wt 191 lb (86.637 kg)  BMI 31.59 kg/m2   Subjective:    Patient ID: Lauren Shannon, female    DOB: Oct 01, 1955, 60 y.o.   MRN: [id number]  HPI: Lauren Shannon is a 61 y.o. female presenting on 10/14/2015 for Sinusitis and Cough   HPI Sinus congestion and pressure Patient has been having sinus congestion and pressure and low-grade fevers for the past 2 days. She has had this recurrently and has been on 2 different antibiotics in the past couple months. She denies any fevers or chills today. She does not have a fever on exam. She denies any shortness of breath or wheezing or chest congestion. She does complain of postnasal drainage.  Relevant past medical, surgical, family and social history reviewed and updated as indicated. Interim medical history since our last visit reviewed. Allergies and medications reviewed and updated.  Review of Systems  Constitutional: Positive for fever (Low-grade). Negative for chills.  HENT: Positive for congestion, postnasal drip, rhinorrhea, sinus pressure and sore throat. Negative for ear discharge, ear pain and sneezing.   Eyes: Negative for pain, redness and visual disturbance.  Respiratory: Positive for cough. Negative for chest tightness and shortness of breath.   Cardiovascular: Negative for chest pain and leg swelling.  Genitourinary: Negative for dysuria and difficulty urinating.  Musculoskeletal: Negative for back pain and gait problem.  Skin: Negative for rash.  Neurological: Negative for light-headedness and headaches.  Psychiatric/Behavioral: Negative for behavioral problems and agitation.  All other systems reviewed and are negative.   Per HPI unless specifically indicated above     Medication List       This list is accurate as of: 10/14/15  4:45 PM.  Always use your most recent med list.               aspirin 81 MG tablet  Take 81 mg by mouth  daily.     fluticasone 50 MCG/ACT nasal spray  Commonly known as:  FLONASE  Place 1 spray into both nostrils 2 (two) times daily as needed for allergies or rhinitis.     LIFESCAN FINEPOINT LANCETS Misc     lovastatin 20 MG tablet  Commonly known as:  MEVACOR  40 mg.     multivitamin capsule  Take 1 capsule by mouth daily.     ONE TOUCH ULTRA TEST test strip  Generic drug:  glucose blood     RESTASIS 0.05 % ophthalmic emulsion  Generic drug:  cycloSPORINE           Objective:    BP 139/81 mmHg  Pulse 74  Temp(Src) 98.1 F (36.7 C) (Oral)  Ht 5' 5.2" (1.656 m)  Wt 191 lb (86.637 kg)  BMI 31.59 kg/m2  Wt Readings from Last 3 Encounters:  10/14/15 191 lb (86.637 kg)  09/20/15 188 lb (85.276 kg)  09/10/15 189 lb (85.73 kg)    Physical Exam  Constitutional: She is oriented to person, place, and time. She appears well-developed and well-nourished. No distress.  HENT:  Right Ear: Tympanic membrane, external ear and ear canal normal.  Left Ear: Tympanic membrane, external ear and ear canal normal.  Nose: Mucosal edema and rhinorrhea present. No epistaxis. Right sinus exhibits no maxillary sinus tenderness and no frontal sinus tenderness. Left sinus exhibits no maxillary sinus tenderness and no frontal sinus tenderness.  Mouth/Throat: Uvula is midline and mucous membranes are  normal. Posterior oropharyngeal edema and posterior oropharyngeal erythema present. No oropharyngeal exudate or tonsillar abscesses.  Eyes: Conjunctivae and EOM are normal.  Cardiovascular: Normal rate, regular rhythm, normal heart sounds and intact distal pulses.   No murmur heard. Pulmonary/Chest: Effort normal and breath sounds normal. No respiratory distress. She has no wheezes.  Musculoskeletal: Normal range of motion. She exhibits no edema or tenderness.  Neurological: She is alert and oriented to person, place, and time. Coordination normal.  Skin: Skin is warm and dry. No rash noted. She is not  diaphoretic.  Psychiatric: She has a normal mood and affect. Her behavior is normal.  Vitals reviewed.     Assessment & Plan:   Problem List Items Addressed This Visit    None    Visit Diagnoses    Acute rhinosinusitis    -  Primary    Patient has been on 2 different antibiotics just recently for recurrent sinus congestion will do conservative measures with Flonase and antihistamine andMucinex    Relevant Medications    fluticasone (FLONASE) 50 MCG/ACT nasal spray        Follow up plan: Return if symptoms worsen or fail to improve.  Counseling provided for all of the vaccine components No orders of the defined types were placed in this encounter.    Caryl Pina, MD [facility] 10/14/2015, 4:45 PM

## 2015-10-14 NOTE — Patient Instructions (Signed)

## 2015-10-15 ENCOUNTER — Ambulatory Visit: Payer: 59 | Admitting: Family

## 2015-10-18 ENCOUNTER — Ambulatory Visit (INDEPENDENT_AMBULATORY_CARE_PROVIDER_SITE_OTHER): Payer: 59

## 2015-10-18 ENCOUNTER — Encounter: Payer: Self-pay | Admitting: Family Medicine

## 2015-10-18 ENCOUNTER — Ambulatory Visit (INDEPENDENT_AMBULATORY_CARE_PROVIDER_SITE_OTHER): Payer: 59 | Admitting: Family Medicine

## 2015-10-18 VITALS — BP 103/60 | HR 73 | Temp 97.3°F | Ht 65.2 in | Wt 188.0 lb

## 2015-10-18 DIAGNOSIS — R05 Cough: Secondary | ICD-10-CM

## 2015-10-18 DIAGNOSIS — R0989 Other specified symptoms and signs involving the circulatory and respiratory systems: Secondary | ICD-10-CM | POA: Diagnosis not present

## 2015-10-18 DIAGNOSIS — R059 Cough, unspecified: Secondary | ICD-10-CM

## 2015-10-18 MED ORDER — LEVOFLOXACIN 500 MG PO TABS: 500.0000 mg | ORAL_TABLET | Freq: Every day | ORAL | Status: DC

## 2015-10-18 NOTE — Progress Notes (Signed)
   Subjective:    Patient ID: Lauren Shannon, female    DOB: 29-Jan-1956, 60 y.o.   MRN: [id number]  HPI Patient here today for chest congestion and cough. This is her 5th recent OV for similar issues. Patient denies head or sinus congestion now. She has not had any wheezing or rattling in her chest. There is no fever or chills. Symptoms actually date back to January.     Patient Active Problem List   Diagnosis Date Noted  . Mixed hyperlipidemia 09/14/2014  . PVC's (premature ventricular contractions) 09/14/2014  . Routine gynecological examination 08/22/2013  . Type 2 diabetes mellitus ([place]) 08/22/2013  . Essential hypertension 08/22/2013   Outpatient Encounter Prescriptions as of 10/18/2015  Medication Sig  . aspirin 81 MG tablet Take 81 mg by mouth daily.  . fluticasone (FLONASE) 50 MCG/ACT nasal spray Place 1 spray into both nostrils 2 (two) times daily as needed for allergies or rhinitis.  Marland Kitchen LIFESCAN FINEPOINT LANCETS MISC   . lovastatin (MEVACOR) 20 MG tablet 40 mg.   . Multiple Vitamin (MULTIVITAMIN) capsule Take 1 capsule by mouth daily.  . ONE TOUCH ULTRA TEST test strip   . RESTASIS 0.05 % ophthalmic emulsion    No facility-administered encounter medications on file as of 10/18/2015.      Review of Systems  Constitutional: Negative.  Fever: better.  HENT: Positive for congestion (chest - green).   Eyes: Negative.   Respiratory: Positive for cough.   Cardiovascular: Negative.   Gastrointestinal: Negative.   Endocrine: Negative.   Genitourinary: Negative.   Musculoskeletal: Negative.   Skin: Negative.   Allergic/Immunologic: Negative.   Neurological: Negative.   Hematological: Negative.   Psychiatric/Behavioral: Negative.        Objective:   Physical Exam  Constitutional: She appears well-developed and well-nourished.  HENT:  Head: Normocephalic.  Mouth/Throat: Oropharynx is clear and moist.  Neck: Normal range of motion. Neck supple.  Cardiovascular: Normal  rate and regular rhythm.   Pulmonary/Chest: Effort normal and breath sounds normal. She has no wheezes.    BP 103/60 mmHg  Pulse 73  Temp(Src) 97.3 F (36.3 C) (Oral)  Ht 5' 5.2" (1.656 m)  Wt 188 lb (85.276 kg)  BMI 31.10 kg/m2       Assessment & Plan:  1. Cough Chest x-ray was done and is completely normal. I still believe her cough is related to drainage. We'll try one more course of antibiotic. She has been on cefdinir and Augmentin and I will give her Levaquin 500 milligrams for 5 days continue Flonase and plenty of fluids - CBC with Differential/Platelet - DG Chest 2 View; Future  2. Chest congestion See above for discussion again I do not think the congestion is primarily a chest infection but sinus - CBC with Differential/Platelet  Wardell Honour MD - DG Chest 2 View; Future

## 2015-10-19 LAB — CBC WITH DIFFERENTIAL/PLATELET
Basophils Absolute: 0 10*3/uL (ref 0.0–0.2)
Basos: 0 %
EOS (ABSOLUTE): 0.1 10*3/uL (ref 0.0–0.4)
Eos: 2 %
Hematocrit: 42.3 % (ref 34.0–46.6)
Hemoglobin: 14.1 g/dL (ref 11.1–15.9)
Immature Grans (Abs): 0 10*3/uL (ref 0.0–0.1)
Immature Granulocytes: 0 %
Lymphocytes Absolute: 2.3 10*3/uL (ref 0.7–3.1)
Lymphs: 39 %
MCH: 31.1 pg (ref 26.6–33.0)
MCHC: 33.3 g/dL (ref 31.5–35.7)
MCV: 93 fL (ref 79–97)
Monocytes Absolute: 0.4 10*3/uL (ref 0.1–0.9)
Monocytes: 7 %
Neutrophils Absolute: 3.1 10*3/uL (ref 1.4–7.0)
Neutrophils: 52 %
Platelets: 269 10*3/uL (ref 150–379)
RBC: 4.53 x10E6/uL (ref 3.77–5.28)
RDW: 13.2 % (ref 12.3–15.4)
WBC: 5.9 10*3/uL (ref 3.4–10.8)

## 2015-12-05 ENCOUNTER — Encounter: Payer: Self-pay | Admitting: Family Medicine

## 2015-12-05 ENCOUNTER — Ambulatory Visit (INDEPENDENT_AMBULATORY_CARE_PROVIDER_SITE_OTHER): Payer: 59 | Admitting: Family Medicine

## 2015-12-05 VITALS — BP 112/79 | HR 90 | Temp 97.5°F | Ht 65.2 in | Wt 183.0 lb

## 2015-12-05 DIAGNOSIS — I10 Essential (primary) hypertension: Secondary | ICD-10-CM

## 2015-12-05 DIAGNOSIS — I493 Ventricular premature depolarization: Secondary | ICD-10-CM

## 2015-12-05 DIAGNOSIS — E782 Mixed hyperlipidemia: Secondary | ICD-10-CM | POA: Diagnosis not present

## 2015-12-05 DIAGNOSIS — E119 Type 2 diabetes mellitus without complications: Secondary | ICD-10-CM | POA: Diagnosis not present

## 2015-12-05 LAB — BAYER DCA HB A1C WAIVED: HB A1C (BAYER DCA - WAIVED): 5.5 % (ref <7.0)

## 2015-12-05 NOTE — Patient Instructions (Signed)
Great to see you!  I recommend regular aerobic exercise, vigorous walking at least 20 minutes 5 days a week is a good first goal.   Lets plan to see you back in 4-5 months and we will do your annual labs.

## 2015-12-05 NOTE — Progress Notes (Signed)
   HPI  Patient presents today for diabetes, hypertension, hyperlipidemia, PVCs.  PVCs are noticed, however they are not associated with any dizziness, lightheadedness, or chest pain. Long-standing Not worsening.  Diabetes Diet controlled, watching diet closely Has not required any medications. Last A1c 5.6.  Hypertension Also diet controlled Not exercising currently, however motivated to start.  Hyperlipidemia Watching diet well, taking lovastatin  PMH: Smoking status noted ROS: Per HPI  Objective: BP 112/79 mmHg  Pulse 90  Temp(Src) 97.5 F (36.4 C) (Oral)  Ht 5' 5.2" (1.656 m)  Wt 183 lb (83.008 kg)  BMI 30.27 kg/m2 Gen: NAD, alert, cooperative with exam HEENT: NCAT, right TM obscured by cerumen, left TM normal CV: RRR, good S1/S2, no murmur Resp: CTABL, no wheezes, non-labored Abd: SNTND, BS present, no guarding or organomegaly Ext: No edema, warm Neuro: Alert and oriented, No gross deficits  Assessment and plan:  # Type 2 diabetes A1c pending Plan every 6 month A1c given very well diet controlled diabetes   # Hypertension Well-controlled, diet only Labs up today Encouraged exercise  # PVCs Stable No red flags Continue to monitor  # Hyperlipidemia Previous LDL 86, taking lovastatin Plan annual labs     Orders Placed This Encounter  Procedures  . Bayer DCA Hb A1c Waived  . Microalbumin / creatinine urine ratio    Laroy Apple, MD [facility] 12/05/2015, 8:19 AM

## 2015-12-06 LAB — MICROALBUMIN / CREATININE URINE RATIO
Creatinine, Urine: 90.8 mg/dL
MICROALB/CREAT RATIO: 6.9 mg/g creat (ref 0.0–30.0)
Microalbumin, Urine: 6.3 ug/mL

## 2016-01-01 ENCOUNTER — Other Ambulatory Visit: Payer: Self-pay | Admitting: Obstetrics & Gynecology

## 2016-01-01 DIAGNOSIS — Z1231 Encounter for screening mammogram for malignant neoplasm of breast: Secondary | ICD-10-CM

## 2016-01-02 ENCOUNTER — Ambulatory Visit (INDEPENDENT_AMBULATORY_CARE_PROVIDER_SITE_OTHER): Payer: 59 | Admitting: Family

## 2016-01-02 ENCOUNTER — Encounter: Payer: Self-pay | Admitting: Family

## 2016-01-02 VITALS — BP 129/84 | HR 70 | Temp 97.0°F | Ht 65.0 in | Wt 190.0 lb

## 2016-01-02 DIAGNOSIS — R1030 Lower abdominal pain, unspecified: Secondary | ICD-10-CM | POA: Diagnosis not present

## 2016-01-02 DIAGNOSIS — N3000 Acute cystitis without hematuria: Secondary | ICD-10-CM | POA: Diagnosis not present

## 2016-01-02 LAB — URINALYSIS
Bilirubin, UA: NEGATIVE
Glucose, UA: NEGATIVE
Ketones, UA: NEGATIVE
Nitrite, UA: NEGATIVE
Protein, UA: NEGATIVE
Specific Gravity, UA: 1.01 (ref 1.005–1.030)
Urobilinogen, Ur: 0.2 mg/dL (ref 0.2–1.0)
pH, UA: 7.5 (ref 5.0–7.5)

## 2016-01-02 LAB — URINALYSIS, MICROSCOPIC ONLY: WBC, UA: >30 /hpf — AB (ref 0–?)

## 2016-01-02 MED ORDER — SULFAMETHOXAZOLE-TRIMETHOPRIM 800-160 MG PO TABS: 1.0000 | ORAL_TABLET | Freq: Two times a day (BID) | ORAL | Status: DC

## 2016-01-02 NOTE — Patient Instructions (Signed)

## 2016-01-02 NOTE — Progress Notes (Signed)
   Subjective:    Patient ID: Lauren Shannon, female    DOB: May 06, 1956, 60 y.o.   MRN: [id number]  Abdominal Pain Associated symptoms include dysuria and frequency. Pertinent negatives include no hematuria, nausea or vomiting.  Dysuria  This is a new problem. The current episode started in the past 7 days. The problem occurs every urination. The problem has been gradually worsening. The quality of the pain is described as aching and burning. The pain is at a severity of 6/10. Associated symptoms include frequency, hesitancy and urgency. Pertinent negatives include no discharge, flank pain, hematuria, nausea or vomiting. She has tried increased fluids for the symptoms. The treatment provided mild relief.      Review of Systems  Gastrointestinal: Positive for abdominal pain. Negative for nausea and vomiting.  Genitourinary: Positive for dysuria, hesitancy, urgency and frequency. Negative for hematuria and flank pain.  All other systems reviewed and are negative.      Objective:   Physical Exam  Constitutional: She is oriented to person, place, and time. She appears well-developed and well-nourished. No distress.  Eyes: Pupils are equal, round, and reactive to light.  Neck: Normal range of motion. Neck supple. No thyromegaly present.  Cardiovascular: Normal rate, regular rhythm, normal heart sounds and intact distal pulses.   No murmur heard. Pulmonary/Chest: Effort normal and breath sounds normal. No respiratory distress. She has no wheezes.  Abdominal: Soft. Bowel sounds are normal. She exhibits no distension. There is tenderness (mild subpubic tenderness).  Musculoskeletal: Normal range of motion. She exhibits no edema or tenderness.  Negative for CVA tenderness   Neurological: She is alert and oriented to person, place, and time.  Skin: Skin is warm and dry.  Psychiatric: She has a normal mood and affect. Her behavior is normal. Judgment and thought content normal.  Vitals  reviewed.     BP 129/84 mmHg  Pulse 70  Temp(Src) 97 F (36.1 C) (Oral)  Ht 5\' 5"  (1.651 m)  Wt 190 lb (86.183 kg)  BMI 31.62 kg/m2     Assessment & Plan:  1. Abdominal pain, lower - Urinalysis - Urine Microscopic  2. Acute cystitis without hematuria -Force fluids AZO over the counter X2 days RTO prn Culture pending - Urine culture - sulfamethoxazole-trimethoprim (BACTRIM DS) 800-160 MG tablet; Take 1 tablet by mouth 2 (two) times daily.  Dispense: 14 tablet; Refill: 0  Evelina Dun, FNP

## 2016-01-04 LAB — URINE CULTURE

## 2016-01-04 LAB — HM DIABETES EYE EXAM

## 2016-01-21 ENCOUNTER — Other Ambulatory Visit (HOSPITAL_COMMUNITY)
Admission: RE | Admit: 2016-01-21 | Discharge: 2016-01-21 | Disposition: A | Discharge status: Home / Self Care | Payer: 59 | Source: Ambulatory Visit | Attending: Obstetrics & Gynecology | Admitting: Obstetrics & Gynecology

## 2016-01-21 ENCOUNTER — Ambulatory Visit (INDEPENDENT_AMBULATORY_CARE_PROVIDER_SITE_OTHER): Payer: 59 | Admitting: Obstetrics & Gynecology

## 2016-01-21 ENCOUNTER — Encounter: Payer: Self-pay | Admitting: Obstetrics & Gynecology

## 2016-01-21 VITALS — BP 110/80 | HR 72 | Ht 65.2 in | Wt 187.4 lb

## 2016-01-21 DIAGNOSIS — Z01419 Encounter for gynecological examination (general) (routine) without abnormal findings: Secondary | ICD-10-CM | POA: Diagnosis not present

## 2016-01-21 DIAGNOSIS — Z1211 Encounter for screening for malignant neoplasm of colon: Secondary | ICD-10-CM

## 2016-01-21 DIAGNOSIS — E049 Nontoxic goiter, unspecified: Secondary | ICD-10-CM

## 2016-01-21 DIAGNOSIS — Z1212 Encounter for screening for malignant neoplasm of rectum: Secondary | ICD-10-CM

## 2016-01-21 NOTE — Progress Notes (Signed)
Patient ID: Lauren Shannon, female   DOB: 01-Jun-1956, 60 y.o.   MRN: [id number] Subjective:     Lauren Shannon is a 60 y.o. female here for a routine exam.  No LMP recorded. Patient is postmenopausal. [id number] Birth Control Method:  Post menopausal  Menstrual Calendar(currently): post menopausal  Current complaints: none.   Current acute medical issues:  none   Recent Gynecologic History No LMP recorded. Patient is postmenopausal. Last Pap: 2016,  normal Last mammogram: 2016,  normal  Past Medical History  Diagnosis Date  . Low back pain   . History of pneumonia 1963  . Mixed hyperlipidemia   . Essential hypertension   . Type 2 diabetes mellitus ([place])   . Allergy   . Arthritis     ostoarthritis    Past Surgical History  Procedure Laterality Date  . Thyroid surgery  2005  . Uterine polyp removal    . Tubal ligation  1993  . Cholecystectomy    . Appendectomy    . Tonsillectomy    . Plantar fascia surgery Right 2012  . Stimulation system implant  2015    OB History    Gravida Para Term Preterm AB TAB SAB Ectopic Multiple Living   4    2 2    2       Social History   Social History  . Marital Status: Married    Spouse Name: N/A  . Number of Children: N/A  . Years of Education: N/A   Social History Main Topics  . Smoking status: Never Smoker   . Smokeless tobacco: None  . Alcohol Use: 0.0 oz/week    0 Standard drinks or equivalent per week     Comment: 6 per year  . Drug Use: No  . Sexual Activity:    Partners: Male   Other Topics Concern  . None   Social History Narrative    Family History  Problem Relation Age of Onset  . Diabetes Mother   . Hypertension Mother   . Heart disease Father   . Heart disease Brother      Current outpatient prescriptions:  .  aspirin 81 MG tablet, Take 81 mg by mouth daily., Disp: , Rfl:  .  cetirizine (ZYRTEC) 10 MG tablet, Take 10 mg by mouth daily., Disp: , Rfl:  .  lovastatin (MEVACOR) 20 MG tablet, 40 mg. , Disp: ,  Rfl:  .  Multiple Vitamin (MULTIVITAMIN) capsule, Take 1 capsule by mouth daily., Disp: , Rfl:   Review of Systems  Review of Systems  Constitutional: Negative for fever, chills, weight loss, malaise/fatigue and diaphoresis.  HENT: Negative for hearing loss, ear pain, nosebleeds, congestion, sore throat, neck pain, tinnitus and ear discharge.   Eyes: Negative for blurred vision, double vision, photophobia, pain, discharge and redness.  Respiratory: Negative for cough, hemoptysis, sputum production, shortness of breath, wheezing and stridor.   Cardiovascular: Negative for chest pain, palpitations, orthopnea, claudication, leg swelling and PND.  Gastrointestinal: negative for abdominal pain. Negative for heartburn, nausea, vomiting, diarrhea, constipation, blood in stool and melena.  Genitourinary: Negative for dysuria, urgency, frequency, hematuria and flank pain.  Musculoskeletal: Negative for myalgias, back pain, joint pain and falls.  Skin: Negative for itching and rash.  Neurological: Negative for dizziness, tingling, tremors, sensory change, speech change, focal weakness, seizures, loss of consciousness, weakness and headaches.  Endo/Heme/Allergies: Negative for environmental allergies and polydipsia. Does not bruise/bleed easily.  Psychiatric/Behavioral: Negative for depression, suicidal ideas, hallucinations, memory loss and substance abuse.  The patient is not nervous/anxious and does not have insomnia.        Objective:  Blood pressure 110/80, pulse 72, height 5' 5.2" (1.656 m), weight 187 lb 6.4 oz (85.004 kg).   Physical Exam  Vitals reviewed. Constitutional: She is oriented to person, place, and time. She appears well-developed and well-nourished.  HENT:  Head: Normocephalic and atraumatic.        Right Ear: External ear normal.  Left Ear: External ear normal.  Nose: Nose normal.  Mouth/Throat: Oropharynx is clear and moist.  Eyes: Conjunctivae and EOM are normal. Pupils are  equal, round, and reactive to light. Right eye exhibits no discharge. Left eye exhibits no discharge. No scleral icterus.  Neck: Normal range of motion. Neck supple. No tracheal deviation present. No thyromegaly present.  Cardiovascular: Normal rate, regular rhythm, normal heart sounds and intact distal pulses.  Exam reveals no gallop and no friction rub.   No murmur heard. Respiratory: Effort normal and breath sounds normal. No respiratory distress. She has no wheezes. She has no rales. She exhibits no tenderness.  GI: Soft. Bowel sounds are normal. She exhibits no distension and no mass. There is no tenderness. There is no rebound and no guarding.  Genitourinary:  Breasts no masses skin changes or nipple changes bilaterally      Vulva is normal without lesions Vagina is pink moist without discharge Cervix normal in appearance and pap is done Uterus is normal size shape and contour Adnexa is negative with normal sized ovaries  {Rectal    hemoccult negative, normal tone, no masses  Musculoskeletal: Normal range of motion. She exhibits no edema and no tenderness.  Neurological: She is alert and oriented to person, place, and time. She has normal reflexes. She displays normal reflexes. No cranial nerve deficit. She exhibits normal muscle tone. Coordination normal.  Skin: Skin is warm and dry. No rash noted. No erythema. No pallor.  Psychiatric: She has a normal mood and affect. Her behavior is normal. Judgment and thought content normal.       Medications Ordered at today's visit: Meds ordered this encounter  Medications  . cetirizine (ZYRTEC) 10 MG tablet    Sig: Take 10 mg by mouth daily.    Other orders placed at today's visit: Orders Placed This Encounter  Procedures  . US THYROID      Assessment:    Healthy female exam.    Well woman exam with routine gynecological exam - Plan: Cytology - PAP  Enlargement of thyroid - Plan: US THYROID  Screening for colorectal  cancer     Plan:    Mammogram ordered. Follow up in: 1 year. sonogram of thyroid ordered     Return in about 1 year (around 01/20/2017) for yearly, with Dr Elonda Husky.

## 2016-01-22 LAB — CYTOLOGY - PAP

## 2016-02-03 ENCOUNTER — Ambulatory Visit (HOSPITAL_COMMUNITY)
Admission: RE | Admit: 2016-02-03 | Discharge: 2016-02-03 | Disposition: A | Discharge status: Home / Self Care | Payer: 59 | Source: Ambulatory Visit | Attending: Obstetrics & Gynecology | Admitting: Obstetrics & Gynecology

## 2016-02-03 DIAGNOSIS — E049 Nontoxic goiter, unspecified: Secondary | ICD-10-CM

## 2016-02-03 DIAGNOSIS — Z1231 Encounter for screening mammogram for malignant neoplasm of breast: Secondary | ICD-10-CM | POA: Diagnosis present

## 2016-02-04 ENCOUNTER — Encounter: Payer: Self-pay | Admitting: Obstetrics & Gynecology

## 2016-02-07 ENCOUNTER — Encounter: Payer: Self-pay | Admitting: Obstetrics & Gynecology

## 2016-03-11 ENCOUNTER — Encounter: Payer: Self-pay | Admitting: Family Medicine

## 2016-05-07 ENCOUNTER — Encounter: Payer: Self-pay | Admitting: Family Medicine

## 2016-05-07 ENCOUNTER — Ambulatory Visit (INDEPENDENT_AMBULATORY_CARE_PROVIDER_SITE_OTHER): Payer: 59 | Admitting: Family Medicine

## 2016-05-07 VITALS — BP 112/74 | HR 81 | Temp 97.3°F | Ht 65.2 in | Wt 182.6 lb

## 2016-05-07 DIAGNOSIS — Z0000 Encounter for general adult medical examination without abnormal findings: Secondary | ICD-10-CM

## 2016-05-07 DIAGNOSIS — R42 Dizziness and giddiness: Secondary | ICD-10-CM | POA: Diagnosis not present

## 2016-05-07 DIAGNOSIS — Z23 Encounter for immunization: Secondary | ICD-10-CM

## 2016-05-07 DIAGNOSIS — E782 Mixed hyperlipidemia: Secondary | ICD-10-CM | POA: Diagnosis not present

## 2016-05-07 DIAGNOSIS — E119 Type 2 diabetes mellitus without complications: Secondary | ICD-10-CM | POA: Diagnosis not present

## 2016-05-07 DIAGNOSIS — Z1211 Encounter for screening for malignant neoplasm of colon: Secondary | ICD-10-CM | POA: Insufficient documentation

## 2016-05-07 LAB — BAYER DCA HB A1C WAIVED: HB A1C (BAYER DCA - WAIVED): 5.6 % (ref <7.0)

## 2016-05-07 MED ORDER — PNEUMOCOCCAL 13-VAL CONJ VACC IM SUSP: 0.5000 mL | INTRAMUSCULAR | Status: DC

## 2016-05-07 NOTE — Progress Notes (Signed)
   HPI  Patient presents today here for routine follow-up with a few questions about chronic medical conditions.  Healthcare maintenance was reviewed in detail as patient was updated Via mychart Hepatitis C was drawn in December 2016 at her last practice Tdap was also given in 2015  Type 2 diabetes Does not check blood sugars, diet controlled  Hyperlipidemia No medications, diet controlled.  Dizziness Is been going on for about 3 months, occurs 2-3 times per week lasting less than 30 seconds noticed as feeling "off balance a little bit lightheaded. No weakness, numbness, tingling. No dysarthria, no change in gait or balance issues.  New medications, no palpitations associated with episodes. Has long history of PVCs, she is not having PVCs at this time or with these episodes  PMH: Smoking status noted ROS: Per HPI  Objective: BP 112/74   Pulse 81   Temp 97.3 F (36.3 C) (Oral)   Ht 5' 5.2" (1.656 m)   Wt 182 lb 9.6 oz (82.8 kg)   BMI 30.20 kg/m  Gen: NAD, alert, cooperative with exam HEENT: NCAT, EOMI, PERRL CV: RRR, good S1/S2, no murmur Resp: CTABL, no wheezes, non-labored Ext: No edema, warm Neuro: Alert and oriented, strength 5/5 and sensation intact in all 4 extremities, cranial nerves II through XII intact  Diabetic Foot Exam - Simple   Simple Foot Form Visual Inspection No deformities, no ulcerations, no other skin breakdown bilaterally:  Yes Sensation Testing Intact to touch and monofilament testing bilaterally:  Yes Pulse Check Posterior Tibialis and Dorsalis pulse intact bilaterally:  Yes Comments      Assessment and plan:  # Dizziness Benign description with normal neurologic and cardiac exam No associated PVCs, reassurance provided Continue to monitor closely   # Type 2 diabetes A1c pending, expect good results Has been controlled for quite some time Foot exam is normal,  # Hyperlipidemia Lipid panel today  # Healthcare  maintenance Prevnar given today Pneumovax given 6 years ago, hep C has already been drawn 08/22/2015.    Orders Placed This Encounter  Procedures  . Bayer DCA Hb A1c Waived  . CMP14+EGFR  . CBC with Differential/Platelet  . Lipid panel  . TSH    No orders of the defined types were placed in this encounter.   Laroy Apple, MD [facility] 05/07/2016, 8:19 AM

## 2016-05-07 NOTE — Addendum Note (Signed)
Addended by: Nigel Berthold C on: 05/07/2016 09:02 AM   Modules accepted: Orders

## 2016-05-07 NOTE — Patient Instructions (Signed)
Great to meet you!  Lets see you again in 6 months  We will send your labs on mychart or call within 1 week

## 2016-05-08 LAB — CBC WITH DIFFERENTIAL/PLATELET
Basophils Absolute: 0 10*3/uL (ref 0.0–0.2)
Basos: 1 %
EOS (ABSOLUTE): 0.1 10*3/uL (ref 0.0–0.4)
Eos: 1 %
Hematocrit: 44.8 % (ref 34.0–46.6)
Hemoglobin: 15.1 g/dL (ref 11.1–15.9)
Immature Grans (Abs): 0 10*3/uL (ref 0.0–0.1)
Immature Granulocytes: 0 %
Lymphocytes Absolute: 1.6 10*3/uL (ref 0.7–3.1)
Lymphs: 39 %
MCH: 31.7 pg (ref 26.6–33.0)
MCHC: 33.7 g/dL (ref 31.5–35.7)
MCV: 94 fL (ref 79–97)
Monocytes Absolute: 0.4 10*3/uL (ref 0.1–0.9)
Monocytes: 8 %
Neutrophils Absolute: 2.1 10*3/uL (ref 1.4–7.0)
Neutrophils: 51 %
Platelets: 222 10*3/uL (ref 150–379)
RBC: 4.76 x10E6/uL (ref 3.77–5.28)
RDW: 12.5 % (ref 12.3–15.4)
WBC: 4.2 10*3/uL (ref 3.4–10.8)

## 2016-05-08 LAB — CMP14+EGFR
ALT: 19 IU/L (ref 0–32)
AST: 21 IU/L (ref 0–40)
Albumin/Globulin Ratio: 2 (ref 1.2–2.2)
Albumin: 4.6 g/dL (ref 3.5–5.5)
Alkaline Phosphatase: 52 IU/L (ref 39–117)
BUN/Creatinine Ratio: 22 (ref 9–23)
BUN: 16 mg/dL (ref 6–24)
Bilirubin Total: 0.8 mg/dL (ref 0.0–1.2)
CO2: 27 mmol/L (ref 18–29)
Calcium: 9.6 mg/dL (ref 8.7–10.2)
Chloride: 100 mmol/L (ref 96–106)
Creatinine, Ser: 0.72 mg/dL (ref 0.57–1.00)
GFR calc Af Amer: 106 mL/min/{1.73_m2} (ref >59)
GFR calc non Af Amer: 92 mL/min/{1.73_m2} (ref >59)
Globulin, Total: 2.3 g/dL (ref 1.5–4.5)
Glucose: 101 mg/dL — ABNORMAL HIGH (ref 65–99)
Potassium: 4.5 mmol/L (ref 3.5–5.2)
Sodium: 142 mmol/L (ref 134–144)
Total Protein: 6.9 g/dL (ref 6.0–8.5)

## 2016-05-08 LAB — LIPID PANEL
Chol/HDL Ratio: 2.6 ratio units (ref 0.0–4.4)
Cholesterol, Total: 176 mg/dL (ref 100–199)
HDL: 67 mg/dL (ref >39)
LDL Calculated: 92 mg/dL (ref 0–99)
Triglycerides: 85 mg/dL (ref 0–149)
VLDL Cholesterol Cal: 17 mg/dL (ref 5–40)

## 2016-05-08 LAB — TSH: TSH: 2.87 u[IU]/mL (ref 0.450–4.500)

## 2016-05-19 ENCOUNTER — Ambulatory Visit (INDEPENDENT_AMBULATORY_CARE_PROVIDER_SITE_OTHER): Payer: 59

## 2016-05-19 DIAGNOSIS — Z23 Encounter for immunization: Secondary | ICD-10-CM

## 2016-06-16 ENCOUNTER — Ambulatory Visit: Payer: 59

## 2016-08-15 ENCOUNTER — Ambulatory Visit (INDEPENDENT_AMBULATORY_CARE_PROVIDER_SITE_OTHER): Payer: 59 | Admitting: Physician Assistant

## 2016-08-15 ENCOUNTER — Encounter: Payer: Self-pay | Admitting: Physician Assistant

## 2016-08-15 VITALS — BP 113/69 | HR 91 | Temp 98.8°F | Ht 65.2 in | Wt 181.0 lb

## 2016-08-15 DIAGNOSIS — J0100 Acute maxillary sinusitis, unspecified: Secondary | ICD-10-CM | POA: Diagnosis not present

## 2016-08-15 MED ORDER — AMOXICILLIN 500 MG PO CAPS: 1000.0000 mg | ORAL_CAPSULE | Freq: Two times a day (BID) | ORAL | 0 refills | Status: DC

## 2016-08-15 MED ORDER — BENZONATATE 100 MG PO CAPS: 200.0000 mg | ORAL_CAPSULE | Freq: Three times a day (TID) | ORAL | 1 refills | Status: DC | PRN

## 2016-08-15 NOTE — Patient Instructions (Signed)

## 2016-08-15 NOTE — Progress Notes (Signed)
BP 113/69 (BP Location: Right Arm, Patient Position: Sitting, Cuff Size: Normal)   Pulse 91   Temp 98.8 F (37.1 C) (Oral)   Ht 5' 5.2" (1.656 m)   Wt 181 lb (82.1 kg)   BMI 29.94 kg/m    Subjective:    Patient ID: Lauren Shannon, female    DOB: August 10, 1956, 60 y.o.   MRN: [id number]  HPI: Lauren Shannon is a 60 y.o. female presenting on 08/15/2016 for Sore Throat (worsening sore throat over the past week. Has used lozenges.); Cough; and Nasal Congestion (mild nasal congestion)  Patient has had increasing sinus pressure for the past week. She has had a history of sinusitis in the past. She denies any severe wheezing. She is coughing more and more this time. Has had significant sinus pressure and postnasal drainage that is yellow in color. She denies any blood at this time.  Relevant past medical, surgical, family and social history reviewed and updated as indicated. Allergies and medications reviewed and updated.  Past Medical History:  Diagnosis Date  . Allergy   . Arthritis    ostoarthritis  . Basal cell carcinoma   . Essential hypertension   . History of pneumonia 1963  . Low back pain   . Mixed hyperlipidemia   . Type 2 diabetes mellitus ([place])     Past Surgical History:  Procedure Laterality Date  . APPENDECTOMY    . CHOLECYSTECTOMY    . PLANTAR FASCIA SURGERY Right 2012  . stimulation system implant  2015  . THYROID SURGERY  2005  . TONSILLECTOMY    . TUBAL LIGATION  1993  . Uterine polyp removal      Review of Systems  Constitutional: Positive for chills and fatigue. Negative for activity change and appetite change.  HENT: Positive for congestion, postnasal drip, sinus pain, sinus pressure and sore throat.   Eyes: Negative.   Respiratory: Positive for cough. Negative for wheezing.   Cardiovascular: Negative.  Negative for chest pain, palpitations and leg swelling.  Gastrointestinal: Negative.   Genitourinary: Negative.   Musculoskeletal: Negative.   Skin:  Negative.   Neurological: Positive for headaches.    Allergies as of 08/15/2016   No Known Allergies     Medication List       Accurate as of 08/15/16  9:22 AM. Always use your most recent med list.          amoxicillin 500 MG capsule Commonly known as:  AMOXIL Take 2 capsules (1,000 mg total) by mouth 2 (two) times daily.   aspirin 81 MG tablet Take 81 mg by mouth daily.   benzonatate 100 MG capsule Commonly known as:  TESSALON Take 2 capsules (200 mg total) by mouth 3 (three) times daily as needed for cough.   cetirizine 10 MG tablet Commonly known as:  ZYRTEC Take 10 mg by mouth daily.   lovastatin 20 MG tablet Commonly known as:  MEVACOR 40 mg.   multivitamin capsule Take 1 capsule by mouth daily.          Objective:    BP 113/69 (BP Location: Right Arm, Patient Position: Sitting, Cuff Size: Normal)   Pulse 91   Temp 98.8 F (37.1 C) (Oral)   Ht 5' 5.2" (1.656 m)   Wt 181 lb (82.1 kg)   BMI 29.94 kg/m   No Known Allergies  Physical Exam  Constitutional: She is oriented to person, place, and time. She appears well-developed and well-nourished.  HENT:  Head: Normocephalic and atraumatic.  Right Ear: Tympanic membrane and external ear normal. No middle ear effusion.  Left Ear: Tympanic membrane and external ear normal.  No middle ear effusion.  Nose: Mucosal edema and rhinorrhea present. Right sinus exhibits no maxillary sinus tenderness. Left sinus exhibits no maxillary sinus tenderness.  Mouth/Throat: Uvula is midline. Posterior oropharyngeal erythema present.  Eyes: Conjunctivae and EOM are normal. Pupils are equal, round, and reactive to light. Right eye exhibits no discharge. Left eye exhibits no discharge.  Neck: Normal range of motion.  Cardiovascular: Normal rate, regular rhythm and normal heart sounds.   Pulmonary/Chest: Effort normal and breath sounds normal. No respiratory distress. She has no wheezes.  Abdominal: Soft.  Lymphadenopathy:      She has no cervical adenopathy.  Neurological: She is alert and oriented to person, place, and time.  Skin: Skin is warm and dry.  Psychiatric: She has a normal mood and affect.        Assessment & Plan:   1. Acute non-recurrent maxillary sinusitis - amoxicillin (AMOXIL) 500 MG capsule; Take 2 capsules (1,000 mg total) by mouth 2 (two) times daily.  Dispense: 40 capsule; Refill: 0 - benzonatate (TESSALON) 100 MG capsule; Take 2 capsules (200 mg total) by mouth 3 (three) times daily as needed for cough.  Dispense: 60 capsule; Refill: 1   Continue all other maintenance medications as listed above.  Follow up plan: Return if symptoms worsen or fail to improve.  No orders of the defined types were placed in this encounter.   Educational handout given for sinusitis  Terald Sleeper PA-C [facility] [address]  [place], [place] [address] [phone]   08/15/2016, 9:22 AM

## 2016-08-18 ENCOUNTER — Ambulatory Visit (INDEPENDENT_AMBULATORY_CARE_PROVIDER_SITE_OTHER): Payer: 59 | Admitting: Family Medicine

## 2016-08-18 ENCOUNTER — Encounter: Payer: Self-pay | Admitting: Family Medicine

## 2016-08-18 VITALS — BP 118/76 | HR 72 | Temp 98.1°F | Ht 65.2 in | Wt 179.4 lb

## 2016-08-18 DIAGNOSIS — J189 Pneumonia, unspecified organism: Secondary | ICD-10-CM | POA: Diagnosis not present

## 2016-08-18 MED ORDER — HYDROCODONE-HOMATROPINE 5-1.5 MG/5ML PO SYRP: 5.0000 mL | ORAL_SOLUTION | Freq: Four times a day (QID) | ORAL | 0 refills | Status: DC | PRN

## 2016-08-18 MED ORDER — LEVOFLOXACIN 500 MG PO TABS: 500.0000 mg | ORAL_TABLET | Freq: Every day | ORAL | 0 refills | Status: DC

## 2016-08-18 NOTE — Progress Notes (Signed)
   HPI  Patient presents today here with cough.  Patient's when she was seen 3 days ago for acute sinusitis, she was treated with amoxicillin and did not improve much. She states that since leaving her cough has gotten worse, she has shortness of breath, central chest pain with deep inspiration or cough, fever measured to 99.8, loss of voice, sore throat, and decreased appetite.  She's tolerating fluids normally. She has mild dyspnea but overall is comfortable breathing.  She states that she's been coughing so hard and so long that her abdomen and back hurt as well with each cough.  PMH: Smoking status noted ROS: Per HPI  Objective: BP 118/76   Pulse 72   Temp 98.1 F (36.7 C) (Oral)   Ht 5' 5.2" (1.656 m)   Wt 179 lb 6.4 oz (81.4 kg)   BMI 29.67 kg/m  Gen: NAD, alert, cooperative with exam HEENT: NCAT, oropharynx moist and clear, nares CV: RRR, good S1/S2, no murmur Resp: Nonlabored, course breath sounds at the bases bilaterally Ext: No edema, warm Neuro: Alert and oriented, No gross deficits  Assessment and plan:  # CAP Worsening symptoms now more consistent with CAP Escalate Antibiotics - Levaquin Hycodan for cough RTC with any concerns    Meds ordered this encounter  Medications  . levofloxacin (LEVAQUIN) 500 MG tablet    Sig: Take 1 tablet (500 mg total) by mouth daily.    Dispense:  7 tablet    Refill:  0  . HYDROcodone-homatropine (HYCODAN) 5-1.5 MG/5ML syrup    Sig: Take 5 mLs by mouth every 6 (six) hours as needed for cough.    Dispense:  120 mL    Refill:  0    Laroy Apple, MD [facility] 08/18/2016, 2:03 PM

## 2016-08-18 NOTE — Patient Instructions (Signed)
Great to see you!   Community-Acquired Pneumonia, Adult Pneumonia is an infection of the lungs. There are different types of pneumonia. One type can develop while a person is in a hospital. A different type, called community-acquired pneumonia, develops in people who are not, or have not recently been, in the hospital or other health care facility. What are the causes? Pneumonia may be caused by bacteria, viruses, or funguses. Community-acquired pneumonia is often caused by Streptococcus pneumonia bacteria. These bacteria are often passed from one person to another by breathing in droplets from the cough or sneeze of an infected person. What increases the risk? The condition is more likely to develop in:  People who havechronic diseases, such as chronic obstructive pulmonary disease (COPD), asthma, congestive heart failure, cystic fibrosis, diabetes, or kidney disease.  People who haveearly-stage or late-stage HIV.  People who havesickle cell disease.  People who havehad their spleen removed (splenectomy).  People who havepoor dental hygiene.  People who havemedical conditions that increase the risk of breathing in (aspirating) secretions their own mouth and nose.  People who havea weakened immune system (immunocompromised).  People who smoke.  People whotravel to areas where pneumonia-causing germs commonly exist.  People whoare around animal habitats or animals that have pneumonia-causing germs, including birds, bats, rabbits, cats, and farm animals.  What are the signs or symptoms? Symptoms of this condition include:  Adry cough.  A wet (productive) cough.  Fever.  Sweating.  Chest pain, especially when breathing deeply or coughing.  Rapid breathing or difficulty breathing.  Shortness of breath.  Shaking chills.  Fatigue.  Muscle aches.  How is this diagnosed? Your health care provider will take a medical history and perform a physical exam. You may  also have other tests, including:  Imaging studies of your chest, including X-rays.  Tests to check your blood oxygen level and other blood gases.  Other tests on blood, mucus (sputum), fluid around your lungs (pleural fluid), and urine.  If your pneumonia is severe, other tests may be done to identify the specific cause of your illness. How is this treated? The type of treatment that you receive depends on many factors, such as the cause of your pneumonia, the medicines you take, and other medical conditions that you have. For most adults, treatment and recovery from pneumonia may occur at home. In some cases, treatment must happen in a hospital. Treatment may include:  Antibiotic medicines, if the pneumonia was caused by bacteria.  Antiviral medicines, if the pneumonia was caused by a virus.  Medicines that are given by mouth or through an IV tube.  Oxygen.  Respiratory therapy.  Although rare, treating severe pneumonia may include:  Mechanical ventilation. This is done if you are not breathing well on your own and you cannot maintain a safe blood oxygen level.  Thoracentesis. This procedureremoves fluid around one lung or both lungs to help you breathe better.  Follow these instructions at home:  Take over-the-counter and prescription medicines only as told by your health care provider. ? Only takecough medicine if you are losing sleep. Understand that cough medicine can prevent your body's natural ability to remove mucus from your lungs. ? If you were prescribed an antibiotic medicine, take it as told by your health care provider. Do not stop taking the antibiotic even if you start to feel better.  Sleep in a semi-upright position at night. Try sleeping in a reclining chair, or place a few pillows under your head.  Do not   use tobacco products, including cigarettes, chewing tobacco, and e-cigarettes. If you need help quitting, ask your health care provider.  Drink enough  water to keep your urine clear or pale yellow. This will help to thin out mucus secretions in your lungs. How is this prevented? There are ways that you can decrease your risk of developing community-acquired pneumonia. Consider getting a pneumococcal vaccine if:  You are older than 59 years of age.  You are older than 59 years of age and are undergoing cancer treatment, have chronic lung disease, or have other medical conditions that affect your immune system. Ask your health care provider if this applies to you.  There are different types and schedules of pneumococcal vaccines. Ask your health care provider which vaccination option is best for you. You may also prevent community-acquired pneumonia if you take these actions:  Get an influenza vaccine every year. Ask your health care provider which type of influenza vaccine is best for you.  Go to the dentist on a regular basis.  Wash your hands often. Use hand sanitizer if soap and water are not available.  Contact a health care provider if:  You have a fever.  You are losing sleep because you cannot control your cough with cough medicine. Get help right away if:  You have worsening shortness of breath.  You have increased chest pain.  Your sickness becomes worse, especially if you are an older adult or have a weakened immune system.  You cough up blood. This information is not intended to replace advice given to you by your health care provider. Make sure you discuss any questions you have with your health care provider. Document Released: 08/17/2005 Document Revised: 12/26/2015 Document Reviewed: 12/12/2014 Elsevier Interactive Patient Education  2017 [employer]  

## 2016-09-02 ENCOUNTER — Other Ambulatory Visit: Payer: Self-pay | Admitting: *Deleted

## 2016-09-02 MED ORDER — LOVASTATIN 40 MG PO TABS: 40.0000 mg | ORAL_TABLET | Freq: Every day | ORAL | 1 refills | Status: DC

## 2016-11-04 ENCOUNTER — Other Ambulatory Visit: Payer: Self-pay | Admitting: Family Medicine

## 2016-11-05 ENCOUNTER — Ambulatory Visit: Payer: 59 | Admitting: Family Medicine

## 2016-11-14 LAB — HM DIABETES EYE EXAM

## 2016-11-25 ENCOUNTER — Ambulatory Visit (INDEPENDENT_AMBULATORY_CARE_PROVIDER_SITE_OTHER): Payer: Managed Care, Other (non HMO) | Admitting: Family Medicine

## 2016-11-25 ENCOUNTER — Encounter: Payer: Self-pay | Admitting: Family Medicine

## 2016-11-25 VITALS — BP 109/72 | HR 73 | Temp 97.3°F | Ht 65.2 in | Wt 182.2 lb

## 2016-11-25 DIAGNOSIS — M25512 Pain in left shoulder: Secondary | ICD-10-CM

## 2016-11-25 DIAGNOSIS — M545 Low back pain, unspecified: Secondary | ICD-10-CM

## 2016-11-25 DIAGNOSIS — M25511 Pain in right shoulder: Secondary | ICD-10-CM

## 2016-11-25 DIAGNOSIS — E669 Obesity, unspecified: Secondary | ICD-10-CM | POA: Diagnosis not present

## 2016-11-25 DIAGNOSIS — E119 Type 2 diabetes mellitus without complications: Secondary | ICD-10-CM | POA: Diagnosis not present

## 2016-11-25 DIAGNOSIS — Z23 Encounter for immunization: Secondary | ICD-10-CM | POA: Diagnosis not present

## 2016-11-25 DIAGNOSIS — E782 Mixed hyperlipidemia: Secondary | ICD-10-CM

## 2016-11-25 DIAGNOSIS — G479 Sleep disorder, unspecified: Secondary | ICD-10-CM

## 2016-11-25 LAB — BAYER DCA HB A1C WAIVED: HB A1C (BAYER DCA - WAIVED): 5.4 % (ref <7.0)

## 2016-11-25 MED ORDER — NAPROXEN 500 MG PO TABS: 500.0000 mg | ORAL_TABLET | Freq: Two times a day (BID) | ORAL | 0 refills | Status: DC

## 2016-11-25 MED ORDER — CYCLOBENZAPRINE HCL 10 MG PO TABS: 10.0000 mg | ORAL_TABLET | Freq: Three times a day (TID) | ORAL | 0 refills | Status: DC | PRN

## 2016-11-25 MED ORDER — TRAZODONE HCL 100 MG PO TABS: 50.0000 mg | ORAL_TABLET | Freq: Every day | ORAL | 3 refills | Status: DC

## 2016-11-25 NOTE — Patient Instructions (Signed)
Great to see you!  We will call with lab results within 1 week  We started trazodone for sleep, start this after you are finished with flexeril ( muscle relaxer)  Lets plan to follow up in 3 months to discuss sleep again.

## 2016-11-25 NOTE — Progress Notes (Signed)
   HPI  Patient presents today here to discuss chronic medical conditions as well as a few acute complaints.  Type 2 diabetes Diet controlled, still watching diet.  Hyperlipidemia Good medication compliance, no problems with medications.  Patient complains of about 3 weeks onset of acute bilateral shoulder pain as well as bilateral low back pain without sciatica. Patient states that she does not have any leg symptoms or injury. She feels well overall. 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 She has not had back pain similar to this previously.  Tylenol is helping, 400 mg of ibuprofen was not helping. She's also had difficulty sleeping for quite some time, she states for several years. This is been worse with the pain.  Has checked with her insurance company would like Zostavax given  PMH: Smoking status noted ROS: Per HPI  Objective: BP 109/72   Pulse 73   Temp 97.3 F (36.3 C) (Oral)   Ht 5' 5.2" (1.656 m)   Wt 182 lb 3.2 oz (82.6 kg)   BMI 30.13 kg/m  Gen: NAD, alert, cooperative with exam HEENT: NCAT CV: RRR, good S1/S2, no murmur Resp: CTABL, no wheezes, non-labored Ext: No edema, warm Neuro: Alert and oriented, No gross deficits  MSK Mild tenderness to palpation of the deltoids bilaterally, negative empty can sign, some pain in the deltoids bilaterally with Hawkins sign, full range of motion in shoulders.  No tenderness to palpation of midline lumbar spine or paraspinal muscles.  Assessment and plan:  # Type 2 diabetes Anticipate good control with her A1c, patient previously well controlled with only diet. Ophthalmology exam brought in  # Hyperlipidemia Continue statin, recheck labs Unusual pain also checking CK  # Bilateral low back pain without sciatica Most likely muscle spasm from degenerative disc disease NSAIDs 7-10 days plus Flexeril at night  # Lateral shoulder pain Unusual shoulder pain, no concern for rotator cuff  syndrome NSAIDs as above, follow-up if not improved  Difficulty sleeping Trial of trazodone after Flexeril is done.  Obesity Patient with difficulty losing weight Recheck Synthroid, discussed exercise, discussed Weight Watchers    Orders Placed This Encounter  Procedures  . TSH  . CMP14+EGFR  . CBC with Differential/Platelet  . Lipid panel  . CK  . Bayer DCA Hb A1c Waived    Meds ordered this encounter  Medications  . naproxen (NAPROSYN) 500 MG tablet    Sig: Take 1 tablet (500 mg total) by mouth 2 (two) times daily with a meal.    Dispense:  20 tablet    Refill:  0  . cyclobenzaprine (FLEXERIL) 10 MG tablet    Sig: Take 1 tablet (10 mg total) by mouth 3 (three) times daily as needed for muscle spasms.    Dispense:  30 tablet    Refill:  0  . traZODone (DESYREL) 100 MG tablet    Sig: Take 0.5-1 tablets (50-100 mg total) by mouth at bedtime.    Dispense:  30 tablet    Refill:  [place], MD [facility] 11/25/2016, 8:32 AM

## 2016-11-25 NOTE — Addendum Note (Signed)
Addended by: Nigel Berthold C on: 11/25/2016 09:06 AM   Modules accepted: Orders

## 2016-11-26 LAB — CMP14+EGFR
ALT: 40 IU/L — ABNORMAL HIGH (ref 0–32)
AST: 26 IU/L (ref 0–40)
Albumin/Globulin Ratio: 2 (ref 1.2–2.2)
Albumin: 4.6 g/dL (ref 3.6–4.8)
Alkaline Phosphatase: 58 IU/L (ref 39–117)
BUN/Creatinine Ratio: 25 (ref 12–28)
BUN: 16 mg/dL (ref 8–27)
Bilirubin Total: 1 mg/dL (ref 0.0–1.2)
CO2: 29 mmol/L (ref 18–29)
Calcium: 9.5 mg/dL (ref 8.7–10.3)
Chloride: 97 mmol/L (ref 96–106)
Creatinine, Ser: 0.63 mg/dL (ref 0.57–1.00)
GFR calc Af Amer: 113 mL/min/{1.73_m2} (ref >59)
GFR calc non Af Amer: 98 mL/min/{1.73_m2} (ref >59)
Globulin, Total: 2.3 g/dL (ref 1.5–4.5)
Glucose: 102 mg/dL — ABNORMAL HIGH (ref 65–99)
Potassium: 4.5 mmol/L (ref 3.5–5.2)
Sodium: 139 mmol/L (ref 134–144)
Total Protein: 6.9 g/dL (ref 6.0–8.5)

## 2016-11-26 LAB — CBC WITH DIFFERENTIAL/PLATELET
Basophils Absolute: 0 10*3/uL (ref 0.0–0.2)
Basos: 0 %
EOS (ABSOLUTE): 0.1 10*3/uL (ref 0.0–0.4)
Eos: 1 %
Hematocrit: 44.9 % (ref 34.0–46.6)
Hemoglobin: 14.9 g/dL (ref 11.1–15.9)
Immature Grans (Abs): 0 10*3/uL (ref 0.0–0.1)
Immature Granulocytes: 0 %
Lymphocytes Absolute: 1.6 10*3/uL (ref 0.7–3.1)
Lymphs: 35 %
MCH: 30.8 pg (ref 26.6–33.0)
MCHC: 33.2 g/dL (ref 31.5–35.7)
MCV: 93 fL (ref 79–97)
Monocytes Absolute: 0.4 10*3/uL (ref 0.1–0.9)
Monocytes: 8 %
Neutrophils Absolute: 2.6 10*3/uL (ref 1.4–7.0)
Neutrophils: 56 %
Platelets: 244 10*3/uL (ref 150–379)
RBC: 4.83 x10E6/uL (ref 3.77–5.28)
RDW: 13.4 % (ref 12.3–15.4)
WBC: 4.7 10*3/uL (ref 3.4–10.8)

## 2016-11-26 LAB — LIPID PANEL
Chol/HDL Ratio: 2.5 ratio units (ref 0.0–4.4)
Cholesterol, Total: 180 mg/dL (ref 100–199)
HDL: 72 mg/dL (ref >39)
LDL Calculated: 92 mg/dL (ref 0–99)
Triglycerides: 78 mg/dL (ref 0–149)
VLDL Cholesterol Cal: 16 mg/dL (ref 5–40)

## 2016-11-26 LAB — TSH: TSH: 4.32 u[IU]/mL (ref 0.450–4.500)

## 2016-11-26 LAB — CK: Total CK: 57 U/L (ref 24–173)

## 2016-12-02 ENCOUNTER — Other Ambulatory Visit: Payer: Self-pay | Admitting: Family Medicine

## 2016-12-12 ENCOUNTER — Encounter: Payer: Self-pay | Admitting: Obstetrics & Gynecology

## 2016-12-15 ENCOUNTER — Other Ambulatory Visit: Payer: Self-pay | Admitting: Obstetrics & Gynecology

## 2016-12-15 DIAGNOSIS — N950 Postmenopausal bleeding: Secondary | ICD-10-CM

## 2016-12-18 ENCOUNTER — Encounter: Payer: Self-pay | Admitting: Obstetrics & Gynecology

## 2016-12-18 ENCOUNTER — Ambulatory Visit (INDEPENDENT_AMBULATORY_CARE_PROVIDER_SITE_OTHER): Payer: Managed Care, Other (non HMO)

## 2016-12-18 ENCOUNTER — Ambulatory Visit (INDEPENDENT_AMBULATORY_CARE_PROVIDER_SITE_OTHER): Payer: Managed Care, Other (non HMO) | Admitting: Obstetrics & Gynecology

## 2016-12-18 VITALS — BP 128/80 | HR 92 | Wt 180.0 lb

## 2016-12-18 DIAGNOSIS — N950 Postmenopausal bleeding: Secondary | ICD-10-CM

## 2016-12-18 DIAGNOSIS — N858 Other specified noninflammatory disorders of uterus: Secondary | ICD-10-CM | POA: Diagnosis not present

## 2016-12-18 NOTE — Progress Notes (Signed)
Follow up appointment for results  Chief Complaint  Patient presents with  . Follow-up    gyn u/s    Blood pressure 128/80, pulse 92, weight 180 lb (81.6 kg).  No results found.    MEDS ordered this encounter: No orders of the defined types were placed in this encounter.   Orders for this encounter: No orders of the defined types were placed in this encounter.   Impression: Postmenopausal bleeding  Atrophic endometrium    Plan: No endometrial samping is required given the sonogram findings Recommend keeping her scheduled appointments  Follow Up: Return for keep scheduled.       Face to face time:  10 minutes  Greater than 50% of the visit time was spent in counseling and coordination of care with the patient.  The summary and outline of the counseling and care coordination is summarized in the note above.   All questions were answered.  Past Medical History:  Diagnosis Date  . Allergy   . Arthritis    ostoarthritis  . Basal cell carcinoma   . Essential hypertension    pt states not currently being treated for this  . History of pneumonia 1963  . Low back pain   . Mixed hyperlipidemia   . Type 2 diabetes mellitus ([place])    pt stated not currently being treated for this    Past Surgical History:  Procedure Laterality Date  . APPENDECTOMY    . CHOLECYSTECTOMY    . PLANTAR FASCIA SURGERY Right 2012  . stimulation system implant  2015  . THYROID SURGERY  2005  . TONSILLECTOMY    . TUBAL LIGATION  1993  . Uterine polyp removal      OB History    Gravida Para Term Preterm AB Living   4       2 2    SAB TAB Ectopic Multiple Live Births     2            No Known Allergies  Social History   Social History  . Marital status: Married    Spouse name: N/A  . Number of children: N/A  . Years of education: N/A   Social History Main Topics  . Smoking status: Never Smoker  . Smokeless tobacco: Never Used  . Alcohol use 0.0 oz/week   Comment: 6 per year  . Drug use: No  . Sexual activity: Not Currently    Partners: Male   Other Topics Concern  . None   Social History Narrative  . None    Family History  Problem Relation Age of Onset  . Diabetes Mother   . Hypertension Mother   . Heart disease Father   . Heart disease Brother

## 2016-12-18 NOTE — Progress Notes (Signed)
PELVIC US TA/TV: homogeneous anteverted uterus,WNL,EEC 4.2 mm,normal ovaries bilat,ovaries appear mobile,no free fluid

## 2016-12-21 ENCOUNTER — Other Ambulatory Visit: Payer: Self-pay | Admitting: Obstetrics & Gynecology

## 2016-12-21 DIAGNOSIS — Z1231 Encounter for screening mammogram for malignant neoplasm of breast: Secondary | ICD-10-CM

## 2016-12-22 ENCOUNTER — Ambulatory Visit (INDEPENDENT_AMBULATORY_CARE_PROVIDER_SITE_OTHER): Payer: Managed Care, Other (non HMO) | Admitting: Family Medicine

## 2016-12-22 ENCOUNTER — Encounter: Payer: Self-pay | Admitting: Family Medicine

## 2016-12-22 VITALS — BP 110/69 | HR 77 | Temp 97.6°F | Ht 65.2 in | Wt 184.0 lb

## 2016-12-22 DIAGNOSIS — N3090 Cystitis, unspecified without hematuria: Secondary | ICD-10-CM

## 2016-12-22 DIAGNOSIS — R399 Unspecified symptoms and signs involving the genitourinary system: Secondary | ICD-10-CM

## 2016-12-22 LAB — URINALYSIS, COMPLETE
Bilirubin, UA: NEGATIVE
Glucose, UA: NEGATIVE
Ketones, UA: NEGATIVE
Nitrite, UA: POSITIVE — AB
Protein, UA: NEGATIVE
Specific Gravity, UA: 1.01 (ref 1.005–1.030)
Urobilinogen, Ur: 0.2 mg/dL (ref 0.2–1.0)
pH, UA: 7 (ref 5.0–7.5)

## 2016-12-22 LAB — MICROSCOPIC EXAMINATION: Renal Epithel, UA: NONE SEEN /hpf

## 2016-12-22 MED ORDER — CIPROFLOXACIN HCL 250 MG PO TABS: 250.0000 mg | ORAL_TABLET | Freq: Two times a day (BID) | ORAL | 0 refills | Status: DC

## 2016-12-22 NOTE — Progress Notes (Signed)
Subjective:  Patient ID: Lauren Shannon, female    DOB: 15-May-1956  Age: 61 y.o. MRN: [id number]  CC: Urinary Tract Infection (pt here today c/o foul smelling urine and cloudy urine)   HPI [name] presents for severe odor with urination mild frequency for several days. Denies fever . No flank pain. No nausea, vomiting.No vaginal DC, itching   History Rella has a past medical history of Allergy; Arthritis; Basal cell carcinoma; Essential hypertension; History of pneumonia (1963); Low back pain; Mixed hyperlipidemia; and Type 2 diabetes mellitus ([place]).   She has a past surgical history that includes Thyroid surgery (2005); Uterine polyp removal; Tubal ligation (1993); Cholecystectomy; Appendectomy; Tonsillectomy; Plantar fascia surgery (Right, 2012); and stimulation system implant (2015).   Her family history includes Diabetes in her mother; Heart disease in her brother and father; Hypertension in her mother.She reports that she has never smoked. She has never used smokeless tobacco. She reports that she drinks alcohol. She reports that she does not use drugs.  Current Outpatient Prescriptions on File Prior to Visit  Medication Sig Dispense Refill  . aspirin 81 MG tablet Take 81 mg by mouth daily.    . cetirizine (ZYRTEC) 10 MG tablet Take 10 mg by mouth daily.    Marland Kitchen lovastatin (MEVACOR) 40 MG tablet TAKE 1 TABLET ONCE DAILY AT 6PM 30 tablet 5  . Multiple Vitamin (MULTIVITAMIN) capsule Take 1 capsule by mouth daily.    . traZODone (DESYREL) 100 MG tablet Take 0.5-1 tablets (50-100 mg total) by mouth at bedtime. 30 tablet 3   Current Facility-Administered Medications on File Prior to Visit  Medication Dose Route Frequency Provider Last Rate Last Dose  . pneumococcal 13-valent conjugate vaccine (PREVNAR 13) injection 0.5 mL  0.5 mL Intramuscular Tomorrow-1000 Timmothy Euler, MD        ROS Review of Systems  Constitutional: Negative for chills, diaphoresis and fever.  HENT:  Negative for congestion.   Eyes: Negative for visual disturbance.  Respiratory: Negative for cough and shortness of breath.   Cardiovascular: Negative for chest pain and palpitations.  Gastrointestinal: Negative for constipation, diarrhea and nausea.  Genitourinary: Positive for dysuria, frequency and urgency. Negative for decreased urine volume, flank pain, hematuria, menstrual problem and pelvic pain.  Musculoskeletal: Negative for arthralgias and joint swelling.  Skin: Negative for rash.  Neurological: Negative for dizziness and numbness.    Objective:  BP 110/69   Pulse 77   Temp 97.6 F (36.4 C) (Oral)   Ht 5' 5.2" (1.656 m)   Wt 184 lb (83.5 kg)   BMI 30.43 kg/m   Physical Exam  Constitutional: She is oriented to person, place, and time. She appears well-developed and well-nourished.  HENT:  Head: Normocephalic and atraumatic.  Cardiovascular: Normal rate and regular rhythm.   No murmur heard. Pulmonary/Chest: Effort normal and breath sounds normal.  Abdominal: Soft. Bowel sounds are normal. She exhibits no mass. There is no tenderness. There is no rebound and no guarding.  Musculoskeletal: She exhibits no tenderness.  Neurological: She is alert and oriented to person, place, and time.  Skin: Skin is warm and dry.  Psychiatric: She has a normal mood and affect. Her behavior is normal.    Assessment & Plan:   [place] was seen today for urinary tract infection.  Diagnoses and all orders for this visit:  UTI symptoms -     Urinalysis, Complete  Cystitis   I am having Ms. Manas maintain her multivitamin, aspirin, cetirizine, traZODone, and lovastatin.  We will continue to administer pneumococcal 13-valent conjugate vaccine.  No orders of the defined types were placed in this encounter.    Follow-up: Return if symptoms worsen or fail to improve.  [name], M.D.

## 2016-12-24 LAB — URINE CULTURE

## 2017-01-20 ENCOUNTER — Ambulatory Visit: Payer: Self-pay | Admitting: Family Medicine

## 2017-01-28 ENCOUNTER — Other Ambulatory Visit: Payer: 59 | Admitting: Obstetrics & Gynecology

## 2017-01-29 ENCOUNTER — Encounter: Payer: Self-pay | Admitting: Obstetrics & Gynecology

## 2017-01-29 ENCOUNTER — Other Ambulatory Visit (HOSPITAL_COMMUNITY)
Admission: RE | Admit: 2017-01-29 | Discharge: 2017-01-29 | Disposition: A | Discharge status: Home / Self Care | Payer: Managed Care, Other (non HMO) | Source: Ambulatory Visit | Attending: Obstetrics & Gynecology | Admitting: Obstetrics & Gynecology

## 2017-01-29 ENCOUNTER — Ambulatory Visit (INDEPENDENT_AMBULATORY_CARE_PROVIDER_SITE_OTHER): Payer: Managed Care, Other (non HMO) | Admitting: Obstetrics & Gynecology

## 2017-01-29 VITALS — BP 110/70 | HR 72 | Ht 65.2 in | Wt 182.0 lb

## 2017-01-29 DIAGNOSIS — Z1212 Encounter for screening for malignant neoplasm of rectum: Secondary | ICD-10-CM | POA: Diagnosis not present

## 2017-01-29 DIAGNOSIS — Z1211 Encounter for screening for malignant neoplasm of colon: Secondary | ICD-10-CM | POA: Diagnosis not present

## 2017-01-29 DIAGNOSIS — Z01419 Encounter for gynecological examination (general) (routine) without abnormal findings: Secondary | ICD-10-CM | POA: Insufficient documentation

## 2017-01-29 NOTE — Progress Notes (Signed)
Subjective:     Lauren Shannon is a 61 y.o. female here for a routine exam.  No LMP recorded. Patient is postmenopausal. [id number] Birth Control Method:  menopausal Menstrual Calendar(currently): had some post menopausal bleeding lat month which has resolved work up was negative  Current complaints: none.   Current acute medical issues:  none   Recent Gynecologic History No LMP recorded. Patient is postmenopausal. Last Pap: 01/21/2016,  normal Last mammogram: 02/03/2016,  normal  Past Medical History:  Diagnosis Date  . Allergy   . Arthritis    ostoarthritis  . Basal cell carcinoma   . Essential hypertension    pt states not currently being treated for this  . History of pneumonia 1963  . Low back pain   . Mixed hyperlipidemia   . Type 2 diabetes mellitus ([place])    pt stated not currently being treated for this    Past Surgical History:  Procedure Laterality Date  . APPENDECTOMY    . CHOLECYSTECTOMY    . PLANTAR FASCIA SURGERY Right 2012  . stimulation system implant  2015  . THYROID SURGERY  2005  . TONSILLECTOMY    . TUBAL LIGATION  1993  . Uterine polyp removal      OB History    Gravida Para Term Preterm AB Living   4       2 2    SAB TAB Ectopic Multiple Live Births     2            Social History   Social History  . Marital status: Married    Spouse name: N/A  . Number of children: N/A  . Years of education: N/A   Social History Main Topics  . Smoking status: Never Smoker  . Smokeless tobacco: Never Used  . Alcohol use 0.0 oz/week     Comment: 6 per year  . Drug use: No  . Sexual activity: Not Currently    Partners: Male   Other Topics Concern  . None   Social History Narrative  . None    Family History  Problem Relation Age of Onset  . Diabetes Mother   . Hypertension Mother   . Heart disease Father   . Heart disease Brother      Current Outpatient Prescriptions:  .  aspirin 81 MG tablet, Take 81 mg by mouth daily., Disp: , Rfl:  .   loratadine (CLARITIN) 10 MG tablet, Take 10 mg by mouth daily., Disp: , Rfl:  .  lovastatin (MEVACOR) 40 MG tablet, TAKE 1 TABLET ONCE DAILY AT 6PM, Disp: 30 tablet, Rfl: 5 .  Multiple Vitamin (MULTIVITAMIN) capsule, Take 1 capsule by mouth daily., Disp: , Rfl:  .  traZODone (DESYREL) 100 MG tablet, Take 0.5-1 tablets (50-100 mg total) by mouth at bedtime., Disp: 30 tablet, Rfl: 3  Current Facility-Administered Medications:  .  pneumococcal 13-valent conjugate vaccine (PREVNAR 13) injection 0.5 mL, 0.5 mL, Intramuscular, Tomorrow-1000, Timmothy Euler, MD  Review of Systems  Review of Systems  Constitutional: Negative for fever, chills, weight loss, malaise/fatigue and diaphoresis.  HENT: Negative for hearing loss, ear pain, nosebleeds, congestion, sore throat, neck pain, tinnitus and ear discharge.   Eyes: Negative for blurred vision, double vision, photophobia, pain, discharge and redness.  Respiratory: Negative for cough, hemoptysis, sputum production, shortness of breath, wheezing and stridor.   Cardiovascular: Negative for chest pain, palpitations, orthopnea, claudication, leg swelling and PND.  Gastrointestinal: negative for abdominal pain. Negative for heartburn, nausea, vomiting, diarrhea,  constipation, blood in stool and melena.  Genitourinary: Negative for dysuria, urgency, frequency, hematuria and flank pain.  Musculoskeletal: Negative for myalgias, back pain, joint pain and falls.  Skin: Negative for itching and rash.  Neurological: Negative for dizziness, tingling, tremors, sensory change, speech change, focal weakness, seizures, loss of consciousness, weakness and headaches.  Endo/Heme/Allergies: Negative for environmental allergies and polydipsia. Does not bruise/bleed easily.  Psychiatric/Behavioral: Negative for depression, suicidal ideas, hallucinations, memory loss and substance abuse. The patient is not nervous/anxious and does not have insomnia.        Objective:   Blood pressure 110/70, pulse 72, height 5' 5.2" (1.656 m), weight 182 lb (82.6 kg).   Physical Exam  Vitals reviewed. Constitutional: She is oriented to person, place, and time. She appears well-developed and well-nourished.  HENT:  Head: Normocephalic and atraumatic.        Right Ear: External ear normal.  Left Ear: External ear normal.  Nose: Nose normal.  Mouth/Throat: Oropharynx is clear and moist.  Eyes: Conjunctivae and EOM are normal. Pupils are equal, round, and reactive to light. Right eye exhibits no discharge. Left eye exhibits no discharge. No scleral icterus.  Neck: Normal range of motion. Neck supple. No tracheal deviation present. No thyromegaly present.  Cardiovascular: Normal rate, regular rhythm, normal heart sounds and intact distal pulses.  Exam reveals no gallop and no friction rub.   No murmur heard. Respiratory: Effort normal and breath sounds normal. No respiratory distress. She has no wheezes. She has no rales. She exhibits no tenderness.  GI: Soft. Bowel sounds are normal. She exhibits no distension and no mass. There is no tenderness. There is no rebound and no guarding.  Genitourinary:  Breasts no masses skin changes or nipple changes bilaterally      Vulva is normal without lesions Vagina is pink moist without discharge Cervix normal in appearance and pap is done Uterus is normal size shape and contour Adnexa is negative with normal sized ovaries  {Rectal    hemoccult negative, normal tone, no masses  Musculoskeletal: Normal range of motion. She exhibits no edema and no tenderness.  Neurological: She is alert and oriented to person, place, and time. She has normal reflexes. She displays normal reflexes. No cranial nerve deficit. She exhibits normal muscle tone. Coordination normal.  Skin: Skin is warm and dry. No rash noted. No erythema. No pallor.  Psychiatric: She has a normal mood and affect. Her behavior is normal. Judgment and thought content normal.        Medications Ordered at today's visit: Meds ordered this encounter  Medications  . loratadine (CLARITIN) 10 MG tablet    Sig: Take 10 mg by mouth daily.    Other orders placed at today's visit: No orders of the defined types were placed in this encounter.     Assessment:    Healthy female exam.    Plan:    Mammogram ordered. Follow up in: 2 years.     Return in about 2 years (around 01/30/2019) for yearly, with Dr Elonda Husky.

## 2017-02-02 LAB — CYTOLOGY - PAP: Diagnosis: NEGATIVE

## 2017-02-04 ENCOUNTER — Ambulatory Visit (HOSPITAL_COMMUNITY)
Admission: RE | Admit: 2017-02-04 | Discharge: 2017-02-04 | Disposition: A | Discharge status: Home / Self Care | Payer: Managed Care, Other (non HMO) | Source: Ambulatory Visit | Attending: Obstetrics & Gynecology | Admitting: Obstetrics & Gynecology

## 2017-02-04 DIAGNOSIS — Z1231 Encounter for screening mammogram for malignant neoplasm of breast: Secondary | ICD-10-CM | POA: Insufficient documentation

## 2017-02-06 ENCOUNTER — Encounter: Payer: Self-pay | Admitting: Obstetrics & Gynecology

## 2017-02-25 ENCOUNTER — Ambulatory Visit (INDEPENDENT_AMBULATORY_CARE_PROVIDER_SITE_OTHER): Payer: Managed Care, Other (non HMO) | Admitting: Family Medicine

## 2017-02-25 ENCOUNTER — Encounter: Payer: Self-pay | Admitting: Family Medicine

## 2017-02-25 VITALS — BP 125/78 | HR 66 | Temp 97.1°F | Ht 65.2 in | Wt 184.4 lb

## 2017-02-25 DIAGNOSIS — E119 Type 2 diabetes mellitus without complications: Secondary | ICD-10-CM

## 2017-02-25 DIAGNOSIS — M940 Chondrocostal junction syndrome [Tietze]: Secondary | ICD-10-CM

## 2017-02-25 DIAGNOSIS — R234 Changes in skin texture: Secondary | ICD-10-CM | POA: Diagnosis not present

## 2017-02-25 DIAGNOSIS — I10 Essential (primary) hypertension: Secondary | ICD-10-CM | POA: Diagnosis not present

## 2017-02-25 MED ORDER — METHYLPREDNISOLONE ACETATE 80 MG/ML IJ SUSP
80.0000 mg | Freq: Once | INTRAMUSCULAR | Status: AC
  Administered 2017-02-25: 80 mg via INTRAMUSCULAR

## 2017-02-25 NOTE — Addendum Note (Signed)
Addended by: Karle Plumber on: 02/25/2017 08:44 AM   Modules accepted: Orders

## 2017-02-25 NOTE — Progress Notes (Signed)
   HPI  Patient presents today here for follow-up chronic medical conditions and costochondritis.  Type 2 diabetes Very good lifestyle choices and eating choices, patient is very invested She does not any medications.  Chest pain Has been occurring since her bout of pneumonia. States that she has right-sided sternal border pain with coughing, this is reproduced by physical exam by her GYN with palpation of the right sternal border with no pain on the left sternal border. No exertional pain. No shortness of breath or palpitations.  The peeling Patient has a long history of feeling, no irritation or itching   PMH: Smoking status noted ROS: Per HPI  Objective: BP 125/78   Pulse 66   Temp 97.1 F (36.2 C) (Oral)   Ht 5' 5.2" (1.656 m)   Wt 184 lb 6.4 oz (83.6 kg)   BMI 30.50 kg/m  Gen: NAD, alert, cooperative with exam HEENT: NCAT CV: RRR, good S1/S2, no murmur Resp: CTABL, no wheezes, non-labored Ext: No edema, warm Neuro: Alert and oriented, No gross deficits  Assessment and plan:  # Costochondritis Pain reproducible on exam by GYN provider- Appreciate his tretament Discussed usual course of illness NSAIDs okay, however not ideal long-term. Ice may be of benefit Discussed examining for any repetitive motion Post inflammatory pleurisy is a consideration but not consistent with pain with palp and not characteristic of her pain.   # Type 2 diabetes Very well controlled in general, A1c was 5.4 last visit Changing A1c that every 6 months Given Depo-Medrol today for costochondritis, I believe that her blood sugar control can handle this easily  # Hypertension Well-controlled without medications  # peeling skin Peeling of the skin on bilateral feet, discussed could be tinea pedis without itching. Trial of Lamisil over-the-counter cream   Laroy Apple, MD [facility] 02/25/2017, 8:21 AM

## 2017-02-25 NOTE — Patient Instructions (Signed)
Great to see you!  Come back in 4 months   Costochondritis Costochondritis is swelling and irritation (inflammation) of the tissue (cartilage) that connects your ribs to your breastbone (sternum). This causes pain in the front of your chest. Usually, the pain:  Starts gradually.  Is in more than one rib.  This condition usually goes away on its own over time. Follow these instructions at home:  Do not do anything that makes your pain worse.  If directed, put ice on the painful area: ? Put ice in a plastic bag. ? Place a towel between your skin and the bag. ? Leave the ice on for 20 minutes, 2-3 times a day.  If directed, put heat on the affected area as often as told by your doctor. Use the heat source that your doctor tells you to use, such as a moist heat pack or a heating pad. ? Place a towel between your skin and the heat source. ? Leave the heat on for 20-30 minutes. ? Take off the heat if your skin turns bright red. This is very important if you cannot feel pain, heat, or cold. You may have a greater risk of getting burned.  Take over-the-counter and prescription medicines only as told by your doctor.  Return to your normal activities as told by your doctor. Ask your doctor what activities are safe for you.  Keep all follow-up visits as told by your doctor. This is important. Contact a doctor if:  You have chills or a fever.  Your pain does not go away or it gets worse.  You have a cough that does not go away. Get help right away if:  You are short of breath. This information is not intended to replace advice given to you by your health care provider. Make sure you discuss any questions you have with your health care provider. Document Released: 02/03/2008 Document Revised: 03/06/2016 Document Reviewed: 12/11/2015 Elsevier Interactive Patient Education  [employer].

## 2017-03-12 ENCOUNTER — Encounter: Payer: Self-pay | Admitting: Family

## 2017-03-12 ENCOUNTER — Ambulatory Visit (INDEPENDENT_AMBULATORY_CARE_PROVIDER_SITE_OTHER): Payer: Managed Care, Other (non HMO) | Admitting: Family

## 2017-03-12 VITALS — BP 118/79 | HR 72 | Temp 97.2°F | Ht 65.2 in | Wt 182.0 lb

## 2017-03-12 DIAGNOSIS — H6123 Impacted cerumen, bilateral: Secondary | ICD-10-CM

## 2017-03-12 DIAGNOSIS — J301 Allergic rhinitis due to pollen: Secondary | ICD-10-CM

## 2017-03-12 MED ORDER — FLUTICASONE PROPIONATE 50 MCG/ACT NA SUSP: 2.0000 | Freq: Every day | NASAL | 6 refills | Status: DC

## 2017-03-12 NOTE — Addendum Note (Signed)
Addended by: Evelina Dun A on: 03/12/2017 04:15 PM   Modules accepted: Orders

## 2017-03-12 NOTE — Patient Instructions (Signed)
Earwax Buildup, Adult The ears produce a substance called earwax that helps keep bacteria out of the ear and protects the skin in the ear canal. Occasionally, earwax can build up in the ear and cause discomfort or hearing loss. What increases the risk? This condition is more likely to develop in people who:  Are female.  Are elderly.  Naturally produce more earwax.  Clean their ears often with cotton swabs.  Use earplugs often.  Use in-ear headphones often.  Wear hearing aids.  Have narrow ear canals.  Have earwax that is overly thick or sticky.  Have eczema.  Are dehydrated.  Have excess hair in the ear canal.  What are the signs or symptoms? Symptoms of this condition include:  Reduced or muffled hearing.  A feeling of fullness in the ear or feeling that the ear is plugged.  Fluid coming from the ear.  Ear pain.  Ear itch.  Ringing in the ear.  Coughing.  An obvious piece of earwax that can be seen inside the ear canal.  How is this diagnosed? This condition may be diagnosed based on:  Your symptoms.  Your medical history.  An ear exam. During the exam, your health care provider will look into your ear with an instrument called an otoscope.  You may have tests, including a hearing test. How is this treated? This condition may be treated by:  Using ear drops to soften the earwax.  Having the earwax removed by a health care provider. The health care provider may: ? Flush the ear with water. ? Use an instrument that has a loop on the end (curette). ? Use a suction device.  Surgery to remove the wax buildup. This may be done in severe cases.  Follow these instructions at home:  Take over-the-counter and prescription medicines only as told by your health care provider.  Do not put any objects, including cotton swabs, into your ear. You can clean the opening of your ear canal with a washcloth or facial tissue.  Follow instructions from your health  care provider about cleaning your ears. Do not over-clean your ears.  Drink enough fluid to keep your urine clear or pale yellow. This will help to thin the earwax.  Keep all follow-up visits as told by your health care provider. If earwax builds up in your ears often or if you use hearing aids, consider seeing your health care provider for routine, preventive ear cleanings. Ask your health care provider how often you should schedule your cleanings.  If you have hearing aids, clean them according to instructions from the manufacturer and your health care provider. Contact a health care provider if:  You have ear pain.  You develop a fever.  You have blood, pus, or other fluid coming from your ear.  You have hearing loss.  You have ringing in your ears that does not go away.  Your symptoms do not improve with treatment.  You feel like the room is spinning (vertigo). Summary  Earwax can build up in the ear and cause discomfort or hearing loss.  The most common symptoms of this condition include reduced or muffled hearing and a feeling of fullness in the ear or feeling that the ear is plugged.  This condition may be diagnosed based on your symptoms, your medical history, and an ear exam.  This condition may be treated by using ear drops to soften the earwax or by having the earwax removed by a health care provider.  Do   not put any objects, including cotton swabs, into your ear. You can clean the opening of your ear canal with a washcloth or facial tissue. This information is not intended to replace advice given to you by your health care provider. Make sure you discuss any questions you have with your health care provider. Document Released: 09/24/2004 Document Revised: 10/28/2016 Document Reviewed: 10/28/2016 Elsevier Interactive Patient Education  2018 [employer].  

## 2017-03-12 NOTE — Progress Notes (Signed)
   Subjective:    Patient ID: Lauren Shannon, female    DOB: [birth year], 61 y.o.   MRN: [id number]  Otalgia   There is pain in both ears. This is a new problem. The current episode started in the past 7 days. The problem occurs every few minutes. The problem has been gradually worsening. There has been no fever. The pain is at a severity of 5/10. The pain is mild. Associated symptoms include coughing, hearing loss and a sore throat. Pertinent negatives include no ear discharge or rhinorrhea. She has tried NSAIDs for the symptoms. The treatment provided mild relief.      Review of Systems  HENT: Positive for ear pain, hearing loss and sore throat. Negative for ear discharge and rhinorrhea.   Respiratory: Positive for cough.   All other systems reviewed and are negative.      Objective:   Physical Exam  Constitutional: She is oriented to person, place, and time. She appears well-developed and well-nourished. No distress.  HENT:  Head: Normocephalic and atraumatic.  Nose: Mucosal edema and rhinorrhea present.  Mouth/Throat: Posterior oropharyngeal erythema present.  Bilateral cerumen impaction  Eyes: Pupils are equal, round, and reactive to light.  Neck: Normal range of motion. Neck supple. No thyromegaly present.  Cardiovascular: Normal rate, regular rhythm, normal heart sounds and intact distal pulses.   No murmur heard. Pulmonary/Chest: Effort normal and breath sounds normal. No respiratory distress. She has no wheezes.  Abdominal: Soft. Bowel sounds are normal. She exhibits no distension. There is no tenderness.  Musculoskeletal: Normal range of motion. She exhibits no edema or tenderness.  Neurological: She is alert and oriented to person, place, and time. She has normal reflexes. No cranial nerve deficit.  Skin: Skin is warm and dry.  Psychiatric: She has a normal mood and affect. Her behavior is normal. Judgment and thought content normal.  Vitals reviewed.     BP 118/79    Pulse 72   Temp (!) 97.2 F (36.2 C) (Oral)   Ht 5' 5.2" (1.656 m)   Wt 182 lb (82.6 kg)   BMI 30.10 kg/m      Assessment & Plan:  1. Allergic rhinitis due to pollen, unspecified seasonality Avoid allergens Start flonase and continue claritin - fluticasone (FLONASE) 50 MCG/ACT nasal spray; Place 2 sprays into both nostrils daily.  Dispense: 16 g; Refill: 6  2. Bilateral impacted cerumen Keep clean and dry Do not stick anything into ears Can use OTC ear drops as needed RTO prn and keep follow up with PCP   Evelina Dun, FNP

## 2017-03-31 ENCOUNTER — Other Ambulatory Visit: Payer: Self-pay | Admitting: Family Medicine

## 2017-04-17 ENCOUNTER — Ambulatory Visit (INDEPENDENT_AMBULATORY_CARE_PROVIDER_SITE_OTHER): Payer: Managed Care, Other (non HMO) | Admitting: Family Medicine

## 2017-04-17 ENCOUNTER — Encounter: Payer: Self-pay | Admitting: Family Medicine

## 2017-04-17 VITALS — BP 115/66 | HR 78 | Temp 98.6°F | Ht 65.2 in | Wt 183.0 lb

## 2017-04-17 DIAGNOSIS — N3000 Acute cystitis without hematuria: Secondary | ICD-10-CM | POA: Diagnosis not present

## 2017-04-17 LAB — URINALYSIS, COMPLETE
Bilirubin, UA: NEGATIVE
Glucose, UA: NEGATIVE
Ketones, UA: NEGATIVE
Nitrite, UA: NEGATIVE
Protein, UA: NEGATIVE
Specific Gravity, UA: 1.015 (ref 1.005–1.030)
Urobilinogen, Ur: 0.2 mg/dL (ref 0.2–1.0)
pH, UA: 8.5 — ABNORMAL HIGH (ref 5.0–7.5)

## 2017-04-17 LAB — MICROSCOPIC EXAMINATION
Renal Epithel, UA: NONE SEEN /hpf
WBC, UA: >30 /hpf — AB (ref 0–?)

## 2017-04-17 MED ORDER — SULFAMETHOXAZOLE-TRIMETHOPRIM 800-160 MG PO TABS: 1.0000 | ORAL_TABLET | Freq: Two times a day (BID) | ORAL | 0 refills | Status: DC

## 2017-04-17 NOTE — Progress Notes (Signed)
BP 115/66   Pulse 78   Temp 98.6 F (37 C) (Oral)   Ht 5' 5.2" (1.656 m)   Wt 183 lb (83 kg)   BMI 30.27 kg/m    Subjective:    Patient ID: Lauren Shannon, female    DOB: 1955/10/13, 61 y.o.   MRN: [id number]  HPI: Lauren Shannon is a 61 y.o. female presenting on 04/17/2017 for Urinary Tract Infection (urinary pressure, slight discomfort, cloudy, odor, began 1 week ago)   HPI Dysuria and odor Patient has been having dysuria and odor or cloudiness in her urine and slight discomfort in her lower abdomen on the left side mostly that has been going on for the past week and has been increasing over the past week. She tried to increase her fluids to try and flush it out but does not seem to be helping. She does not get these too frequently but does get sometimes. She denies any flank pain or fevers or chills. She denies any pain radiating anywhere else.  Relevant past medical, surgical, family and social history reviewed and updated as indicated. Interim medical history since our last visit reviewed. Allergies and medications reviewed and updated.  Review of Systems  Constitutional: Negative for chills and fever.  Respiratory: Negative for chest tightness and shortness of breath.   Cardiovascular: Negative for chest pain and leg swelling.  Gastrointestinal: Positive for abdominal pain.  Genitourinary: Positive for dysuria, frequency and urgency. Negative for decreased urine volume, difficulty urinating, flank pain, hematuria, vaginal bleeding and vaginal discharge.  Musculoskeletal: Negative for back pain and gait problem.  Skin: Negative for rash.  Neurological: Negative for light-headedness and headaches.  Psychiatric/Behavioral: Negative for agitation and behavioral problems.  All other systems reviewed and are negative.   Per HPI unless specifically indicated above        Objective:    BP 115/66   Pulse 78   Temp 98.6 F (37 C) (Oral)   Ht 5' 5.2" (1.656 m)   Wt 183 lb  (83 kg)   BMI 30.27 kg/m   Wt Readings from Last 3 Encounters:  04/17/17 183 lb (83 kg)  03/12/17 182 lb (82.6 kg)  02/25/17 184 lb 6.4 oz (83.6 kg)    Physical Exam  Constitutional: She is oriented to person, place, and time. She appears well-developed and well-nourished. No distress.  Eyes: Conjunctivae are normal.  Cardiovascular: Normal rate, regular rhythm, normal heart sounds and intact distal pulses.   No murmur heard. Pulmonary/Chest: Effort normal and breath sounds normal. No respiratory distress. She has no wheezes. She has no rales.  Abdominal: Soft. Bowel sounds are normal. She exhibits no distension. There is no hepatosplenomegaly. There is tenderness in the suprapubic area and left lower quadrant. There is no rigidity, no rebound, no guarding and no CVA tenderness.  Musculoskeletal: Normal range of motion. She exhibits no edema or tenderness.  Neurological: She is alert and oriented to person, place, and time. Coordination normal.  Skin: Skin is warm and dry. No rash noted. She is not diaphoretic.  Psychiatric: She has a normal mood and affect. Her behavior is normal.  Nursing note and vitals reviewed.   Urinalysis: Greater than 30 WBCs, 3-10 RBCs, 0-10 epithelial cells, moderate bacteria, leukocytes 3+ and trace blood    Assessment & Plan:   Problem List Items Addressed This Visit    None    Visit Diagnoses    Acute cystitis without hematuria    -  Primary   Relevant  Medications   sulfamethoxazole-trimethoprim (BACTRIM DS,SEPTRA DS) 800-160 MG tablet   Other Relevant Orders   Urinalysis, Complete       Follow up plan: Return if symptoms worsen or fail to improve.  Counseling provided for all of the vaccine components Orders Placed This Encounter  Procedures  . Urinalysis, Complete    Caryl Pina, MD [facility] 04/17/2017, 8:25 AM

## 2017-05-28 ENCOUNTER — Other Ambulatory Visit: Payer: Self-pay | Admitting: Family Medicine

## 2017-06-26 ENCOUNTER — Other Ambulatory Visit: Payer: Self-pay | Admitting: Family Medicine

## 2017-06-30 ENCOUNTER — Encounter: Payer: Self-pay | Admitting: Family Medicine

## 2017-06-30 ENCOUNTER — Ambulatory Visit (INDEPENDENT_AMBULATORY_CARE_PROVIDER_SITE_OTHER): Payer: 59 | Admitting: Family Medicine

## 2017-06-30 VITALS — BP 114/65 | HR 76 | Temp 97.2°F | Ht 65.2 in | Wt 188.2 lb

## 2017-06-30 DIAGNOSIS — M791 Myalgia, unspecified site: Secondary | ICD-10-CM

## 2017-06-30 DIAGNOSIS — E119 Type 2 diabetes mellitus without complications: Secondary | ICD-10-CM | POA: Diagnosis not present

## 2017-06-30 DIAGNOSIS — E782 Mixed hyperlipidemia: Secondary | ICD-10-CM | POA: Diagnosis not present

## 2017-06-30 LAB — BAYER DCA HB A1C WAIVED: HB A1C (BAYER DCA - WAIVED): 5.6 % (ref <7.0)

## 2017-06-30 MED ORDER — METHYLPREDNISOLONE ACETATE 80 MG/ML IJ SUSP
80.0000 mg | Freq: Once | INTRAMUSCULAR | Status: AC
  Administered 2017-06-30: 80 mg via INTRAMUSCULAR

## 2017-06-30 MED ORDER — LOVASTATIN 40 MG PO TABS: ORAL_TABLET | ORAL | 3 refills | Status: DC

## 2017-06-30 NOTE — Patient Instructions (Signed)
Great to see you!  Lets plan on following up in 4 months.   Please come back or call if your symptoms are not improving.

## 2017-06-30 NOTE — Progress Notes (Signed)
   HPI  Patient presents today here for 57-monthfollow-up as well as pain.  Type 2 diabetes Patient watching diet carefully, she is reasonably active. No medications.  Hyperlipidemia Fasting this morning. Good medication compliance with lovastatin, needs refill.  Muscle pain Patient with 3 weeks of bilateral paraspinal muscle pain in the neck, shoulders, lumbar and thoracic spine, bilateral buttock, and bilateral posterior thigh.   This pain is described as dull achy pain that worsens with activity.  It worsens with very simple activities like getting up to go make a cup of coffee.  Her low back pain is worse than the rest of the pain.  She also has throbbing type pain of the bilateral feet on the dorsal surface.  She describes this as throbbing crampy type pain. No foot numbness, change in sensation, or injuries.    PMH: Smoking status noted ROS: Per HPI  Objective: BP 114/65   Pulse 76   Temp (!) 97.2 F (36.2 C) (Oral)   Ht 5' 5.2" (1.656 m)   Wt 188 lb 3.2 oz (85.4 kg)   BMI 31.13 kg/m  Gen: NAD, alert, cooperative with exam HEENT: NCAT CV: RRR, good S1/S2, no murmur Resp: CTABL, no wheezes, non-labored Ext: No edema, warm Neuro: Alert and oriented, No gross deficits MSK: Mild tenderness to palpation over the left side SI joint, no midline tenderness to palpation of the cervical, thoracic, or lumbar spine. Full range of motion of cervical spine. No paraspinal muscle tenderness to palpation of the cervical, thoracic, or lumbar areas except for left-sided SI tenderness. Negative hip testing on the right   Diabetic Foot Exam - Simple   Simple Foot Form Diabetic Foot exam was performed with the following findings:  Yes 06/30/2017  8:26 AM  Visual Inspection No deformities, no ulcerations, no other skin breakdown bilaterally:  Yes Sensation Testing Intact to touch and monofilament testing bilaterally:  Yes Pulse Check Posterior Tibialis and Dorsalis pulse intact  bilaterally:  Yes Comments      Assessment and plan:  #Type 2 diabetes Expect very good control Diet controlled No changes  #Muscle pain Widespread muscle pain, difficult to explain why this is happening. IM Depo-Medrol given with SI joint pain on palpation Consider rhabdo with statin, CK collected Consider low vitamin D Consider new onset fibromyalgia, however this is an atypical story  #Hyperlipidemia Fasting labs today, continue lovastatin Clinically stable   Patient has recently started Myrbetriq, however this is unlikely to be related to muscle pain  Orders Placed This Encounter  Procedures  . Bayer DCA Hb A1c Waived  . CMP14+EGFR  . CBC with Differential/Platelet  . Lipid panel  . VITAMIN D 25 Hydroxy (Vit-D Deficiency, Fractures)  . CK    Meds ordered this encounter  Medications  . mirabegron ER (MYRBETRIQ) 50 MG TB24 tablet    Sig: Take 50 mg by mouth daily.  .Marland Kitchenlovastatin (MEVACOR) 40 MG tablet    Sig: TAKE 1 TABLET ONCE DAILY AT 6PM    Dispense:  90 tablet    Refill:  3  . methylPREDNISolone acetate (DEPO-MEDROL) injection 80 mg    SLaroy Apple MD WTristan SchroederRChildren[facility]S Hospital Of [facility] AngelesFamily Medicine 06/30/2017, 8:28 AM

## 2017-07-01 LAB — CBC WITH DIFFERENTIAL/PLATELET
Basophils Absolute: 0 10*3/uL (ref 0.0–0.2)
Basos: 1 %
EOS (ABSOLUTE): 0.1 10*3/uL (ref 0.0–0.4)
Eos: 1 %
Hematocrit: 42.2 % (ref 34.0–46.6)
Hemoglobin: 14.3 g/dL (ref 11.1–15.9)
Immature Grans (Abs): 0 10*3/uL (ref 0.0–0.1)
Immature Granulocytes: 0 %
Lymphocytes Absolute: 1.7 10*3/uL (ref 0.7–3.1)
Lymphs: 39 %
MCH: 31.7 pg (ref 26.6–33.0)
MCHC: 33.9 g/dL (ref 31.5–35.7)
MCV: 94 fL (ref 79–97)
Monocytes Absolute: 0.3 10*3/uL (ref 0.1–0.9)
Monocytes: 7 %
Neutrophils Absolute: 2.3 10*3/uL (ref 1.4–7.0)
Neutrophils: 52 %
Platelets: 249 10*3/uL (ref 150–379)
RBC: 4.51 x10E6/uL (ref 3.77–5.28)
RDW: 12.5 % (ref 12.3–15.4)
WBC: 4.3 10*3/uL (ref 3.4–10.8)

## 2017-07-01 LAB — CMP14+EGFR
ALT: 30 IU/L (ref 0–32)
AST: 28 IU/L (ref 0–40)
Albumin/Globulin Ratio: 2.1 (ref 1.2–2.2)
Albumin: 4.6 g/dL (ref 3.6–4.8)
Alkaline Phosphatase: 53 IU/L (ref 39–117)
BUN/Creatinine Ratio: 21 (ref 12–28)
BUN: 14 mg/dL (ref 8–27)
Bilirubin Total: 0.7 mg/dL (ref 0.0–1.2)
CO2: 26 mmol/L (ref 20–29)
Calcium: 9.5 mg/dL (ref 8.7–10.3)
Chloride: 100 mmol/L (ref 96–106)
Creatinine, Ser: 0.67 mg/dL (ref 0.57–1.00)
GFR calc Af Amer: 110 mL/min/{1.73_m2} (ref >59)
GFR calc non Af Amer: 96 mL/min/{1.73_m2} (ref >59)
Globulin, Total: 2.2 g/dL (ref 1.5–4.5)
Glucose: 93 mg/dL (ref 65–99)
Potassium: 4.3 mmol/L (ref 3.5–5.2)
Sodium: 142 mmol/L (ref 134–144)
Total Protein: 6.8 g/dL (ref 6.0–8.5)

## 2017-07-01 LAB — LIPID PANEL
Chol/HDL Ratio: 2.4 ratio (ref 0.0–4.4)
Cholesterol, Total: 175 mg/dL (ref 100–199)
HDL: 74 mg/dL (ref >39)
LDL Calculated: 86 mg/dL (ref 0–99)
Triglycerides: 75 mg/dL (ref 0–149)
VLDL Cholesterol Cal: 15 mg/dL (ref 5–40)

## 2017-07-01 LAB — CK: Total CK: 72 U/L (ref 24–173)

## 2017-07-01 LAB — VITAMIN D 25 HYDROXY (VIT D DEFICIENCY, FRACTURES): Vit D, 25-Hydroxy: 43.7 ng/mL (ref 30.0–100.0)

## 2017-07-11 ENCOUNTER — Encounter: Payer: Self-pay | Admitting: Family Medicine

## 2017-07-29 ENCOUNTER — Ambulatory Visit: Payer: 59 | Admitting: Pediatrics

## 2017-07-29 ENCOUNTER — Encounter: Payer: Self-pay | Admitting: Pediatrics

## 2017-07-29 VITALS — BP 115/82 | HR 77 | Temp 97.0°F | Ht 65.2 in | Wt 192.0 lb

## 2017-07-29 DIAGNOSIS — N3090 Cystitis, unspecified without hematuria: Secondary | ICD-10-CM

## 2017-07-29 DIAGNOSIS — R300 Dysuria: Secondary | ICD-10-CM

## 2017-07-29 DIAGNOSIS — Z23 Encounter for immunization: Secondary | ICD-10-CM | POA: Diagnosis not present

## 2017-07-29 DIAGNOSIS — K5900 Constipation, unspecified: Secondary | ICD-10-CM

## 2017-07-29 LAB — URINALYSIS
Bilirubin, UA: NEGATIVE
Glucose, UA: NEGATIVE
Ketones, UA: NEGATIVE
Nitrite, UA: NEGATIVE
Protein, UA: NEGATIVE
RBC, UA: NEGATIVE
Specific Gravity, UA: 1.015 (ref 1.005–1.030)
Urobilinogen, Ur: 0.2 mg/dL (ref 0.2–1.0)
pH, UA: 7 (ref 5.0–7.5)

## 2017-07-29 MED ORDER — LINACLOTIDE 145 MCG PO CAPS: 145.0000 ug | ORAL_CAPSULE | Freq: Every day | ORAL | 2 refills | Status: DC

## 2017-07-29 MED ORDER — LINACLOTIDE 145 MCG PO CAPS: 145.0000 ug | ORAL_CAPSULE | Freq: Every day | ORAL | 0 refills | Status: DC

## 2017-07-29 MED ORDER — SULFAMETHOXAZOLE-TRIMETHOPRIM 800-160 MG PO TABS: 1.0000 | ORAL_TABLET | Freq: Two times a day (BID) | ORAL | 0 refills | Status: DC

## 2017-07-29 NOTE — Progress Notes (Signed)
  Subjective:   Patient ID: Lauren Shannon, female    DOB: June 18, 1956, 61 y.o.   MRN: [id number] CC: Urinary Tract Infection and Dysuria  HPI: Lauren Shannon is a 61 y.o. female presenting for Urinary Tract Infection and Dysuria  Symptoms started a few days Burning bothering the most now No fevers Appetite normal No stomach pain  Constipated most of the time Tried milk of magnesia, senna, stool softeners OTC Having small hard stools not daily   Relevant past medical, surgical, family and social history reviewed. Allergies and medications reviewed and updated. Social History   Tobacco Use  Smoking Status Never Smoker  Smokeless Tobacco Never Used   ROS: Per HPI   Objective:    BP 115/82   Pulse 77   Temp (!) 97 F (36.1 C) (Oral)   Ht 5' 5.2" (1.656 m)   Wt 192 lb (87.1 kg)   BMI 31.75 kg/m   Wt Readings from Last 3 Encounters:  07/29/17 192 lb (87.1 kg)  06/30/17 188 lb 3.2 oz (85.4 kg)  04/17/17 183 lb (83 kg)    Gen: NAD, alert, cooperative with exam, NCAT EYES: EOMI, no conjunctival injection, or no icterus CV: NRRR, normal S1/S2, no murmur, distal pulses 2+ b/l Resp: CTABL, no wheezes, normal WOB Abd: +BS, soft, NTND. no guarding or organomegaly, no CVA tenderness Ext: No edema, warm Neuro: Alert and oriented MSK: normal muscle bulk  Assessment & Plan:  [name] was seen today for urinary tract infection and dysuria.  Diagnoses and all orders for this visit:  Dysuria Cystits Positive UA Treat with below, f/u culture -     Urinalysis -     Urine Culture -     sulfamethoxazole-trimethoprim (BACTRIM DS) 800-160 MG tablet; Take 1 tablet by mouth 2 (two) times daily.  Constipation, unspecified constipation type Ongoing symptoms, may be contributing to frequent UTIs Tried multiple OTC meds, trial of below -     linaclotide (LINZESS) 145 MCG CAPS capsule; Take 1 capsule (145 mcg total) by mouth daily before breakfast.  Need for immunization against  influenza -     Flu Vaccine QUAD 36+ mos IM   Follow up plan: As needed Assunta Found, MD [place]

## 2017-07-30 ENCOUNTER — Ambulatory Visit: Payer: 59

## 2017-07-30 ENCOUNTER — Encounter: Payer: Self-pay | Admitting: Pediatrics

## 2017-08-02 LAB — URINE CULTURE

## 2017-09-10 ENCOUNTER — Other Ambulatory Visit: Payer: Self-pay | Admitting: Family Medicine

## 2017-09-10 DIAGNOSIS — R351 Nocturia: Secondary | ICD-10-CM | POA: Diagnosis not present

## 2017-09-10 DIAGNOSIS — N3941 Urge incontinence: Secondary | ICD-10-CM | POA: Diagnosis not present

## 2017-10-29 ENCOUNTER — Encounter: Payer: Self-pay | Admitting: Family Medicine

## 2017-10-29 ENCOUNTER — Ambulatory Visit: Payer: BLUE CROSS/BLUE SHIELD | Admitting: Family Medicine

## 2017-10-29 VITALS — BP 114/70 | HR 80 | Temp 97.1°F | Ht 65.2 in | Wt 194.4 lb

## 2017-10-29 DIAGNOSIS — E119 Type 2 diabetes mellitus without complications: Secondary | ICD-10-CM

## 2017-10-29 DIAGNOSIS — Z780 Asymptomatic menopausal state: Secondary | ICD-10-CM | POA: Diagnosis not present

## 2017-10-29 DIAGNOSIS — Z86010 Personal history of colonic polyps: Secondary | ICD-10-CM | POA: Diagnosis not present

## 2017-10-29 LAB — BAYER DCA HB A1C WAIVED: HB A1C (BAYER DCA - WAIVED): 5.4 % (ref <7.0)

## 2017-10-29 NOTE — Progress Notes (Signed)
   HPI  Patient presents today for follow-up chronic medical conditions Type 2 diabetes Watching diet well. No medications Not checking blood sugar.  History of colon polyps Had a colonoscopy around age 63, was told to follow-up in 5 years.  Postmenopausal-would like a DEXA scan  PMH: Smoking status noted ROS: Per HPI  Objective: BP 114/70   Pulse 80   Temp (!) 97.1 F (36.2 C) (Oral)   Ht 5' 5.2" (1.656 m)   Wt 194 lb 6.4 oz (88.2 kg)   BMI 32.15 kg/m  Gen: NAD, alert, cooperative with exam HEENT: NCAT CV: RRR, good S1/S2, no murmur Resp: CTABL, no wheezes, non-labored Ext: No edema, warm Neuro: Alert and oriented, No gross deficits  Assessment and plan:  #Type 2 diabetes Diet controlled A1c pending, expect good control  #History of colon polyps Refer to GI for repeat colonoscopy  #Postmenopausal-need of DEXA scan, DEXA scan ordered today     Orders Placed This Encounter  Procedures  . DG [facility] DEXA    Order Specific Question:   Reason for Exam (SYMPTOM  OR DIAGNOSIS REQUIRED)    Answer:   screening    Order Specific Question:   Is the patient pregnant?    Answer:   No  . Microalbumin / creatinine urine ratio  . Bayer DCA Hb A1c Waived  . Ambulatory referral to Gastroenterology    Referral Priority:   Routine    Referral Type:   Consultation    Referral Reason:   Specialty Services Required    Number of Visits Requested:   [place], MD [place] 10/29/2017, 8:17 AM

## 2017-10-30 LAB — MICROALBUMIN / CREATININE URINE RATIO
Creatinine, Urine: 111.3 mg/dL
Microalb/Creat Ratio: 4.3 mg/g creat (ref 0.0–30.0)
Microalbumin, Urine: 4.8 ug/mL

## 2017-11-01 ENCOUNTER — Encounter (INDEPENDENT_AMBULATORY_CARE_PROVIDER_SITE_OTHER): Payer: Self-pay | Admitting: *Deleted

## 2017-11-05 ENCOUNTER — Other Ambulatory Visit: Payer: BLUE CROSS/BLUE SHIELD

## 2017-11-05 ENCOUNTER — Ambulatory Visit (INDEPENDENT_AMBULATORY_CARE_PROVIDER_SITE_OTHER): Payer: BLUE CROSS/BLUE SHIELD

## 2017-11-05 DIAGNOSIS — Z780 Asymptomatic menopausal state: Secondary | ICD-10-CM | POA: Diagnosis not present

## 2017-11-05 DIAGNOSIS — M810 Age-related osteoporosis without current pathological fracture: Secondary | ICD-10-CM | POA: Diagnosis not present

## 2017-11-11 ENCOUNTER — Other Ambulatory Visit: Payer: Self-pay | Admitting: Pediatrics

## 2017-11-11 DIAGNOSIS — K5900 Constipation, unspecified: Secondary | ICD-10-CM

## 2017-11-12 ENCOUNTER — Encounter: Payer: Self-pay | Admitting: Pharmacist Clinician (PhC)/ Clinical Pharmacy Specialist

## 2017-11-12 ENCOUNTER — Other Ambulatory Visit: Payer: Self-pay | Admitting: Pharmacist Clinician (PhC)/ Clinical Pharmacy Specialist

## 2017-11-12 ENCOUNTER — Other Ambulatory Visit: Payer: Self-pay | Admitting: Family Medicine

## 2017-11-12 ENCOUNTER — Ambulatory Visit (INDEPENDENT_AMBULATORY_CARE_PROVIDER_SITE_OTHER): Payer: BLUE CROSS/BLUE SHIELD | Admitting: Pharmacist Clinician (PhC)/ Clinical Pharmacy Specialist

## 2017-11-12 DIAGNOSIS — M810 Age-related osteoporosis without current pathological fracture: Secondary | ICD-10-CM

## 2017-11-12 DIAGNOSIS — M5136 Other intervertebral disc degeneration, lumbar region: Secondary | ICD-10-CM | POA: Insufficient documentation

## 2017-11-12 MED ORDER — RISEDRONATE SODIUM 150 MG PO TABS: 150.0000 mg | ORAL_TABLET | ORAL | 6 refills | Status: DC

## 2017-11-12 NOTE — Progress Notes (Signed)
Patient is referred for review of her dexascan results and treatment.  She went through menopause at age 62 year old and never took HRT.  She has never broken a bone.  Patient has 1 cup of caffeine a day in the form of coffee and does not drink alcohol or smoke nicotine.  Her last vitamin D and calcium levels were in 05/2017.  Her vitamin D was 43.7mg /dL.  She takes a supplement with 600IU of Vitamin D and 1000mg  of calcium carbonate.  I asked her to increase her Vitamin D to 800IU a day and switch to calcium citrate for better GI absorption.  She has no history of GI ulcers or esophaghitis or GERD.  A/P:  1.  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  Patient has strong family history of osteoporosis in mother, maternal grandmother, and maternal uncle.   She has DDD and significant back and neck pain treated with acetaminophen.  Patient has never gone to PT.  I think this would greatly benefit her.  2.  Fall prevention counseling and weight bearing exercises reviewed at appointment.  3.  Started Actonel monthly with extensive counseling on how to take mediation, duration of treatment, side effects, and expected results.  Will re-check dexascan in 1 year.  4.  Patient has been on Mevacor for 10 years with no family history of CVD or personal CV risk factors.  Baseline lipids are unknown since she was not with our practice at that time.  Myalgias present.  Patient will stop Mevacor for 8 weeks then re-check lipid panel to determine baseline and if she needs a statin based on new treatment guidelines.  Total time with patient  45 minutes  Memory Argue, PharmD, CPP, FNLA

## 2017-11-13 LAB — HM DIABETES EYE EXAM

## 2017-11-15 ENCOUNTER — Other Ambulatory Visit: Payer: BLUE CROSS/BLUE SHIELD

## 2017-11-17 ENCOUNTER — Encounter: Payer: Self-pay | Admitting: Family Medicine

## 2017-11-17 ENCOUNTER — Ambulatory Visit: Payer: BLUE CROSS/BLUE SHIELD | Attending: Family Medicine | Admitting: Physical Therapy

## 2017-11-17 ENCOUNTER — Encounter: Payer: Self-pay | Admitting: Physical Therapy

## 2017-11-17 DIAGNOSIS — M545 Low back pain, unspecified: Secondary | ICD-10-CM

## 2017-11-17 DIAGNOSIS — G8929 Other chronic pain: Secondary | ICD-10-CM | POA: Insufficient documentation

## 2017-11-17 NOTE — Therapy (Signed)
[facility] [place], [place], [address] Phone: [phone]   Fax:  [phone]  Physical Therapy Evaluation  Patient Details  Name: Lauren Shannon MRN: [id number] Date of Birth: Sep 07, 1955 Referring Provider: Kenn File MD   Encounter Date: 11/17/2017  PT End of Session - 11/17/17 1328    Visit Number  1    Number of Visits  12    Date for PT Re-Evaluation  12/29/17    PT Start Time  0102    PT Stop Time  0152    PT Time Calculation (min)  50 min    Activity Tolerance  Patient tolerated treatment well    Behavior During Therapy  [facility] for tasks assessed/performed       Past Medical History:  Diagnosis Date  . Allergy   . Arthritis    ostoarthritis  . Basal cell carcinoma   . Essential hypertension    pt states not currently being treated for this  . History of pneumonia 1963  . Low back pain   . Mixed hyperlipidemia   . Type 2 diabetes mellitus ([place])    pt stated not currently being treated for this    Past Surgical History:  Procedure Laterality Date  . APPENDECTOMY    . CHOLECYSTECTOMY    . PLANTAR FASCIA SURGERY Right 2012  . stimulation system implant  2015  . THYROID SURGERY  2005  . TONSILLECTOMY    . TUBAL LIGATION  1993  . Uterine polyp removal      There were no vitals filed for this visit.   Subjective Assessment - 11/17/17 1331    Subjective  The patient reports a long h/o low back pain that includes DDD.  She has recently been diagnosed with OP.  Her pain is rated at a 8/10.  Increased standing and sitting (driving) increases her pain.  She has found nothing really decreases her pain much.      Pertinent History  OA; OP; DDD.    Limitations  Sitting    How long can you sit comfortably?  30 minutes.    How long can you walk comfortably?  Short community distances.    Diagnostic tests  DEXA scan.    Patient Stated Goals  To be able to exercise again.    Currently in Pain?  Yes    Pain  Score  8     Pain Location  Back    Pain Descriptors / Indicators  Aching    Pain Type  Chronic pain    Pain Onset  More than a month ago    Pain Frequency  Constant    Aggravating Factors   See above.    Pain Relieving Factors  See above.         [facility] PT Assessment - 11/17/17 0001      Assessment   Medical Diagnosis  Lumbar DDD.    Referring Provider  Kenn File MD    Onset Date/Surgical Date  -- Ongoing.      Precautions   Precautions  -- OP.  No spinal loading.  No bending, extending and twisting.      Restrictions   Weight Bearing Restrictions  No      Balance Screen   Has the patient fallen in the past 6 months  No    Has the patient had a decrease in activity level because of a fear of falling?   No    Is the patient reluctant  to leave their home because of a fear of falling?   No      Home Environment   Living Environment  Private residence      Prior Function   Level of Independence  Independent      Posture/Postural Control   Posture Comments  Generally good posture.      ROM / Strength   AROM / PROM / Strength  AROM;Strength      AROM   Overall AROM Comments  In supine:  Normal hip ROM and hamstring length.        Strength   Overall Strength Comments  Normal bilateral LE strength.      Palpation   Palpation comment  Tender with overpressure L4 to S1 and across her low back region from this region.  Tender to palpation over right QL with increased tone noted.      Special Tests   Other special tests  (=) leg lengths; (-) SLR testing. LE DTR's= 1+/4+.       Bed Mobility   Bed Mobility  -- Slow and cautious from plinth.      Ambulation/Gait   Gait Comments  WNL.             Objective measurements completed on examination: See above findings.      [facility] Adult PT Treatment/Exercise - 11/17/17 0001      Modalities   Modalities  Electrical Stimulation;Moist Heat      Moist Heat Therapy   Number Minutes Moist Heat  15 Minutes     Moist Heat Location  Lumbar Spine      Electrical Stimulation   Electrical Stimulation Location  Lower lumbar    Electrical Stimulation Action  IFC    Electrical Stimulation Parameters  80-150 Hz x 15 minutes.    Electrical Stimulation Goals  Pain                  PT Long Term Goals - 11/17/17 1356      PT LONG TERM GOAL #1   Title  Independent with a HEP.    Time  6    Period  Weeks    Status  New      PT LONG TERM GOAL #2   Title  Sit 30 minutes with pain not > 3/10.    Time  6    Period  Weeks    Status  New      PT LONG TERM GOAL #3   Title  Sit 30 minutes with pain not > 3/10.    Time  6    Period  Weeks    Status  New      PT LONG TERM GOAL #4   Title  Perform ADL's with pain not > 3/10.    Time  6    Period  Weeks    Status  New             Plan - 11/17/17 1343    Clinical Impression Statement  The patient presents to OPPT with a long h/o low back pain.  She has a h/o OP and DDD.  He pain impaired her functional mobility and she has not been able to exercise as she would like.  She reports high pain-levels from L4 to S1 and across from this region.  Patient will benefit from skilled physical therapy intervention to adress deficits.    Rehab Potential  Good    PT Frequency  2x / week  PT Duration  6 weeks    PT Treatment/Interventions  ADLs/Self Care Home Management;Cryotherapy;Electrical Stimulation;Moist Heat;Therapeutic activities;Therapeutic exercise;Functional mobility training;Patient/family education;Manual techniques    PT Next Visit Plan  No spinal loading.  Begin with draw-ins and hip bridges; SKTC stretch; weight bearing exercise.  Modalites and STW/M to right QL.    Consulted and Agree with Plan of Care  Patient       Patient will benefit from skilled therapeutic intervention in order to improve the following deficits and impairments:  Decreased activity tolerance, Pain  Visit Diagnosis: Chronic bilateral low back pain without  sciatica - Plan: PT plan of care cert/re-cert     Problem List Patient Active Problem List   Diagnosis Date Noted  . DDD (degenerative disc disease), lumbar 11/12/2017  . Healthcare maintenance 05/07/2016  . Mixed hyperlipidemia 09/14/2014  . PVC's (premature ventricular contractions) 09/14/2014  . Routine gynecological examination 08/22/2013  . Type 2 diabetes mellitus ([place]) 08/22/2013  . Essential hypertension 08/22/2013    Krystie Leiter, [place] MPT 11/17/2017, 1:58 PM  [facility] [address] [place], [place], [address] Phone: [phone]   Fax:  [phone]  Name: Lauren Shannon MRN: [id number] Date of Birth: 1955-09-06

## 2017-11-23 ENCOUNTER — Encounter: Payer: Self-pay | Admitting: Family Medicine

## 2017-11-23 ENCOUNTER — Encounter: Payer: Self-pay | Admitting: Physical Therapy

## 2017-11-23 ENCOUNTER — Ambulatory Visit: Payer: BLUE CROSS/BLUE SHIELD | Admitting: Physical Therapy

## 2017-11-23 DIAGNOSIS — M545 Low back pain: Secondary | ICD-10-CM | POA: Diagnosis not present

## 2017-11-23 DIAGNOSIS — K635 Polyp of colon: Secondary | ICD-10-CM

## 2017-11-23 DIAGNOSIS — G8929 Other chronic pain: Secondary | ICD-10-CM

## 2017-11-23 NOTE — Therapy (Signed)
[facility] [place], [place], [address] Phone: [phone]   Fax:  [phone]  Physical Therapy Treatment  Patient Details  Name: Lauren Shannon MRN: [id number] Date of Birth: 08/18/1956 Referring Provider: Kenn File MD   Encounter Date: 11/23/2017  PT End of Session - 11/23/17 1749    Visit Number  2    Number of Visits  12    Date for PT Re-Evaluation  12/29/17    PT Start Time  0431    PT Stop Time  0524    PT Time Calculation (min)  53 min       Past Medical History:  Diagnosis Date  . Allergy   . Arthritis    ostoarthritis  . Basal cell carcinoma   . Essential hypertension    pt states not currently being treated for this  . History of pneumonia 1963  . Low back pain   . Mixed hyperlipidemia   . Type 2 diabetes mellitus ([place])    pt stated not currently being treated for this    Past Surgical History:  Procedure Laterality Date  . APPENDECTOMY    . CHOLECYSTECTOMY    . PLANTAR FASCIA SURGERY Right 2012  . stimulation system implant  2015  . THYROID SURGERY  2005  . TONSILLECTOMY    . TUBAL LIGATION  1993  . Uterine polyp removal      There were no vitals filed for this visit.  Subjective Assessment - 11/23/17 1750    Subjective  The patient reports having a rough weekend with a lot of pain.    Pain Score  7     Pain Location  Back    Pain Descriptors / Indicators  Aching    Pain Onset  More than a month ago                No data recorded       [facility] Adult PT Treatment/Exercise - 11/23/17 0001      Modalities   Modalities  Electrical Stimulation;Moist Heat;Ultrasound      Moist Heat Therapy   Number Minutes Moist Heat  20 Minutes    Moist Heat Location  Lumbar Spine      Electrical Stimulation   Electrical Stimulation Location  Lower Lumbar.    Electrical Stimulation Action  IFC    Electrical Stimulation Parameters  10-150 Hz x 20 minutes.    Electrical  Stimulation Goals  Tone;Pain      Ultrasound   Ultrasound Location  Left sdly position with folded pillow betwwen knees:  Combo e'stim/U/S at 1.50 W/CM2 x 14 minutes to right low back region.      Manual Therapy   Manual Therapy  Soft tissue mobilization    Soft tissue mobilization  STW/M x 10 minutes to left low back musculature including QL release technique.                  PT Long Term Goals - 11/17/17 1356      PT LONG TERM GOAL #1   Title  Independent with a HEP.    Time  6    Period  Weeks    Status  New      PT LONG TERM GOAL #2   Title  Sit 30 minutes with pain not > 3/10.    Time  6    Period  Weeks    Status  New      PT LONG TERM  GOAL #3   Title  Sit 30 minutes with pain not > 3/10.    Time  6    Period  Weeks    Status  New      PT LONG TERM GOAL #4   Title  Perform ADL's with pain not > 3/10.    Time  6    Period  Weeks    Status  New            Plan - 11/23/17 1753    Clinical Impression Statement  Patient responded very favorably to treatment.  Her right QL is very taut to palpation.    PT Treatment/Interventions  ADLs/Self Care Home Management;Cryotherapy;Electrical Stimulation;Moist Heat;Therapeutic activities;Therapeutic exercise;Functional mobility training;Patient/family education;Manual techniques    PT Next Visit Plan  No spinal loading.  Begin with draw-ins and hip bridges; SKTC stretch; weight bearing exercise.  Modalites and STW/M to right QL.    Consulted and Agree with Plan of Care  Patient       Patient will benefit from skilled therapeutic intervention in order to improve the following deficits and impairments:  Decreased activity tolerance, Pain  Visit Diagnosis: Chronic bilateral low back pain without sciatica     Problem List Patient Active Problem List   Diagnosis Date Noted  . DDD (degenerative disc disease), lumbar 11/12/2017  . Healthcare maintenance 05/07/2016  . Mixed hyperlipidemia 09/14/2014  .  PVC's (premature ventricular contractions) 09/14/2014  . Routine gynecological examination 08/22/2013  . Type 2 diabetes mellitus ([place]) 08/22/2013  . Essential hypertension 08/22/2013    Shamon Lobo, [place] MPT 11/23/2017, 5:54 PM  [facility] [address] [place], [place], [address] Phone: [phone]   Fax:  [phone]  Name: Kamalei Roeder MRN: [id number] Date of Birth: 1955-09-14

## 2017-11-25 ENCOUNTER — Ambulatory Visit: Payer: BLUE CROSS/BLUE SHIELD | Admitting: Physical Therapy

## 2017-11-25 ENCOUNTER — Encounter: Payer: Self-pay | Admitting: Physical Therapy

## 2017-11-25 DIAGNOSIS — M545 Low back pain: Secondary | ICD-10-CM | POA: Diagnosis not present

## 2017-11-25 DIAGNOSIS — G8929 Other chronic pain: Secondary | ICD-10-CM

## 2017-11-25 NOTE — Therapy (Signed)
[facility] [place], [place], [address] Phone: [phone]   Fax:  [phone]  Physical Therapy Treatment  Patient Details  Name: Lauren Shannon MRN: [id number] Date of Birth: 09/16/1955 Referring Provider: Kenn File MD   Encounter Date: 11/25/2017  PT End of Session - 11/25/17 1817    Visit Number  3    Number of Visits  12    Date for PT Re-Evaluation  12/29/17    PT Start Time  0445    PT Stop Time  0541    PT Time Calculation (min)  56 min    Activity Tolerance  Patient tolerated treatment well    Behavior During Therapy  [facility] for tasks assessed/performed       Past Medical History:  Diagnosis Date  . Allergy   . Arthritis    ostoarthritis  . Basal cell carcinoma   . Essential hypertension    pt states not currently being treated for this  . History of pneumonia 1963  . Low back pain   . Mixed hyperlipidemia   . Type 2 diabetes mellitus ([place])    pt stated not currently being treated for this    Past Surgical History:  Procedure Laterality Date  . APPENDECTOMY    . CHOLECYSTECTOMY    . PLANTAR FASCIA SURGERY Right 2012  . stimulation system implant  2015  . THYROID SURGERY  2005  . TONSILLECTOMY    . TUBAL LIGATION  1993  . Uterine polyp removal      There were no vitals filed for this visit.  Subjective Assessment - 11/25/17 1746    Subjective  That last treatment helped.  My mid-back went into spasm last night.    Pain Score  5     Pain Location  Back    Pain Descriptors / Indicators  Aching    Pain Onset  More than a month ago                No data recorded       [facility] Adult PT Treatment/Exercise - 11/25/17 0001      Exercises   Exercises  Lumbar      Lumbar Exercises: Supine   Other Supine Lumbar Exercises  SKTC bilateral with 30 sec holds f/b instruct in and performance of hip bridges to fatigue.      Modalities   Modalities  Electrical Stimulation;Moist Heat       Moist Heat Therapy   Number Minutes Moist Heat  20 Minutes    Moist Heat Location  Lumbar Spine      Electrical Stimulation   Electrical Stimulation Location  Lower lumbar    Electrical Stimulation Action  IFC    Electrical Stimulation Parameters  80-150 Hz x 20 minutes.    Electrical Stimulation Goals  Tone;Pain      Ultrasound   Ultrasound Location  Combo e'stim/U/S at 1.50 W/CM2 x 8 minutes to right low back musculature.      Manual Therapy   Manual Therapy  Soft tissue mobilization    Soft tissue mobilization  STW/M x 13 minutes with right QL release technique.                  PT Long Term Goals - 11/17/17 1356      PT LONG TERM GOAL #1   Title  Independent with a HEP.    Time  6    Period  Weeks    Status  New      PT LONG TERM GOAL #2   Title  Sit 30 minutes with pain not > 3/10.    Time  6    Period  Weeks    Status  New      PT LONG TERM GOAL #3   Title  Sit 30 minutes with pain not > 3/10.    Time  6    Period  Weeks    Status  New      PT LONG TERM GOAL #4   Title  Perform ADL's with pain not > 3/10.    Time  6    Period  Weeks    Status  New            Plan - 11/25/17 1748    Clinical Impression Statement  Patinent did well with ther ex today though she was only able to perform a hip bridge to mid-range.  the patient was provided with red therabnad for home use to perform scapular retracttion exercise.    Clinical Presentation  Stable    Clinical Decision Making  Low    PT Treatment/Interventions  ADLs/Self Care Home Management;Cryotherapy;Electrical Stimulation;Moist Heat;Therapeutic activities;Therapeutic exercise;Functional mobility training;Patient/family education;Manual techniques    PT Next Visit Plan  No spinal loading.  Begin with draw-ins and hip bridges; SKTC stretch; weight bearing exercise.  Modalites and STW/M to right QL.    Consulted and Agree with Plan of Care  Patient       Patient will benefit from skilled  therapeutic intervention in order to improve the following deficits and impairments:  Decreased activity tolerance, Pain  Visit Diagnosis: Chronic bilateral low back pain without sciatica     Problem List Patient Active Problem List   Diagnosis Date Noted  . DDD (degenerative disc disease), lumbar 11/12/2017  . Healthcare maintenance 05/07/2016  . Mixed hyperlipidemia 09/14/2014  . PVC's (premature ventricular contractions) 09/14/2014  . Routine gynecological examination 08/22/2013  . Type 2 diabetes mellitus ([place]) 08/22/2013  . Essential hypertension 08/22/2013    Angelys Yetman, [place] MPT 11/25/2017, 6:18 PM  [facility] [address] [place], [place], [address] Phone: [phone]   Fax:  [phone]  Name: Mareesa Gathright MRN: [id number] Date of Birth: 11/11/1955

## 2017-11-30 ENCOUNTER — Ambulatory Visit: Payer: BLUE CROSS/BLUE SHIELD | Attending: Family Medicine | Admitting: Physical Therapy

## 2017-11-30 DIAGNOSIS — M545 Low back pain: Secondary | ICD-10-CM | POA: Insufficient documentation

## 2017-11-30 DIAGNOSIS — G8929 Other chronic pain: Secondary | ICD-10-CM | POA: Insufficient documentation

## 2017-11-30 NOTE — Therapy (Signed)
[facility] [place], [place], [address] Phone: [phone]   Fax:  [phone]  Physical Therapy Treatment  Patient Details  Name: Lauren Shannon MRN: [id number] Date of Birth: [birth year] Referring Provider: Kenn File MD   Encounter Date: 11/30/2017  PT End of Session - 11/30/17 1658    Visit Number  4    Number of Visits  12    Date for PT Re-Evaluation  12/29/17    PT Start Time  0315    PT Stop Time  0402    PT Time Calculation (min)  47 min    Activity Tolerance  Patient tolerated treatment well    Behavior During Therapy  [facility] for tasks assessed/performed       Past Medical History:  Diagnosis Date  . Allergy   . Arthritis    ostoarthritis  . Basal cell carcinoma   . Essential hypertension    pt states not currently being treated for this  . History of pneumonia 1963  . Low back pain   . Mixed hyperlipidemia   . Type 2 diabetes mellitus ([place])    pt stated not currently being treated for this    Past Surgical History:  Procedure Laterality Date  . APPENDECTOMY    . CHOLECYSTECTOMY    . PLANTAR FASCIA SURGERY Right 2012  . stimulation system implant  2015  . THYROID SURGERY  2005  . TONSILLECTOMY    . TUBAL LIGATION  1993  . Uterine polyp removal      There were no vitals filed for this visit.  Subjective Assessment - 11/30/17 1654    Subjective  The treatments are helping a lot.  My pain is low today.    Pain Score  2     Pain Location  Back    Pain Descriptors / Indicators  Dull;Discomfort    Pain Type  Chronic pain    Pain Onset  More than a month ago                       [facility] Adult PT Treatment/Exercise - 11/30/17 0001      Modalities   Modalities  Electrical Stimulation;Moist Heat      Moist Heat Therapy   Number Minutes Moist Heat  15 Minutes    Moist Heat Location  Lumbar Spine      Electrical Stimulation   Electrical Stimulation Location  R    Electrical  Stimulation Action  Right lumbar.    Electrical Stimulation Parameters  Pre-mod. at 80-150 Hz x 15 minutes.    Electrical Stimulation Goals  Tone;Pain      Ultrasound   Ultrasound Location  Right low back.    Ultrasound Parameters  1.50 W/CM2 x 12 minutes.    Ultrasound Goals  Pain      Manual Therapy   Manual Therapy  Soft tissue mobilization    Soft tissue mobilization  STW/M x 11 minutes including right QL release technique.                  PT Long Term Goals - 11/17/17 1356      PT LONG TERM GOAL #1   Title  Independent with a HEP.    Time  6    Period  Weeks    Status  New      PT LONG TERM GOAL #2   Title  Sit 30 minutes with pain not > 3/10.  Time  6    Period  Weeks    Status  New      PT LONG TERM GOAL #3   Title  Sit 30 minutes with pain not > 3/10.    Time  6    Period  Weeks    Status  New      PT LONG TERM GOAL #4   Title  Perform ADL's with pain not > 3/10.    Time  6    Period  Weeks    Status  New            Plan - 11/30/17 1658    Clinical Impression Statement  Excellent response to treatments thus far.  Patient with a low 2/10 pain-level upon presentation to the clinic today.  Her right QL had markedly less tone today.    PT Treatment/Interventions  ADLs/Self Care Home Management;Cryotherapy;Electrical Stimulation;Moist Heat;Therapeutic activities;Therapeutic exercise;Functional mobility training;Patient/family education;Manual techniques    PT Next Visit Plan  No spinal loading.  Begin with draw-ins and hip bridges; SKTC stretch; weight bearing exercise.  Modalites and STW/M to right QL.    Consulted and Agree with Plan of Care  Patient       Patient will benefit from skilled therapeutic intervention in order to improve the following deficits and impairments:  Decreased activity tolerance, Pain  Visit Diagnosis: Chronic bilateral low back pain without sciatica     Problem List Patient Active Problem List   Diagnosis  Date Noted  . DDD (degenerative disc disease), lumbar 11/12/2017  . Healthcare maintenance 05/07/2016  . Mixed hyperlipidemia 09/14/2014  . PVC's (premature ventricular contractions) 09/14/2014  . Routine gynecological examination 08/22/2013  . Type 2 diabetes mellitus ([place]) 08/22/2013  . Essential hypertension 08/22/2013    Earlie Schank, [place] MPT 11/30/2017, 5:00 PM  [facility] [place], [place], [address] Phone: [phone]   Fax:  [phone]  Name: Lakyla Biswas MRN: [id number] Date of Birth: 1956-06-08

## 2017-12-02 ENCOUNTER — Encounter: Payer: Self-pay | Admitting: Physical Therapy

## 2017-12-02 ENCOUNTER — Ambulatory Visit: Payer: BLUE CROSS/BLUE SHIELD | Admitting: Physical Therapy

## 2017-12-02 DIAGNOSIS — G8929 Other chronic pain: Secondary | ICD-10-CM | POA: Diagnosis not present

## 2017-12-02 DIAGNOSIS — M545 Low back pain: Principal | ICD-10-CM

## 2017-12-02 NOTE — Patient Instructions (Addendum)
[facility]  DRY NEEDLING CONSENT FORM   Trigger point dry needling is a physical therapy approach to treat Myofascial Pain and Dysfunction.  Dry Needling (DN) is a valuable and effective way to deactivate myofascial trigger points (muscle knots/pain). It is skilled intervention that uses a thin filiform needle to penetrate the skin and stimulate underlying myofascial trigger points, muscular, and connective tissues for the management of neuromusculoskeletal pain and movement impairments.  A local twitch response (LTR) will be elicited.  This can sometimes feel like a deep ache in the muscle during the procedure. Multiple trigger points in multiple muscles can be treated during each treatment.  No medication of any kind is injected.   As with any medical treatment and procedure, there are possible adverse events.  While significant adverse events are uncommon, they do sometimes occur and must be considered prior to giving consent.  1. Dry needling often causes a "post needling soreness".  There can be an increase in pain from a couple of hours to 2-3 days, followed by an improvement in the overall pain state. 2. Any time a needle is used there is a risk of infection.  However, we are using new, sterile, and disposable needles; infections are extremely rare. 3. There is a possibility that you may bleed or bruise.  You may feel tired and some nausea following treatment. 4. There is a rare possibility of a pneumothorax (air in the chest cavity). 5. Allergic reaction to nickel in the stainless steel needle. 6. If a nerve is touched, it may cause paresthesia (a prickling/shock sensation) which is usually brief, but may continue for a couple of days.  Following treatment stay hydrated.  Continue regular activities but not too vigorous initially after treatment for 24-48 hours.  Dry Needling is best when combined with other physical therapy interventions such as  strengthening, stretching and other therapeutic modalities.   PLEASE ANSWER THE FOLLOWING QUESTIONS:  Do you have a lack of sensation?   Y/N  Do you have a phobia or fear of needles  Y/N  Are you pregnant?    Y/N If yes:  How many weeks? __________ Do you have any implanted devices?  Y/N If yes:  Pacemaker/Spinal Cord Stimulator/Deep Brain Stimulator/Insulin Pump/Other: ________________ Do you have any implants?  Y/N If yes: Breast/Facial/Pecs/Buttocks/Calves/Hip  Replacement/ Knee Replacement/Other: _________ Do you take any blood thinners?   Y/N If yes: Coumadin (Warfarin)/Other: ___________________ Do you have a bleeding disorder?   Y/N If yes: What kind: _________________________________ Do you take any immunosuppressants?  Y/N If yes:   What kind: _________________________________ Do you take anti-inflammatories?   Y/N If yes: What kind: Advil/Aspirin/Other: ________________ Have you ever been diagnosed with Scoliosis? Y/N Have you had back surgery?   Y/N If yes:  Laminectomy/Fusion/Other: ___________________   I have read, or had read to me, the above.  I have had the opportunity to ask any questions.  All of my questions have been answered to my satisfaction and I understand the risks involved with dry needling.  I consent to examination and treatment at [facility], including dry needling, of any and all of my involved and affected muscles.  

## 2017-12-02 NOTE — Therapy (Signed)
[facility] [place], [place], [address] Phone: [phone]   Fax:  [phone]  Physical Therapy Treatment  Patient Details  Name: Lauren Shannon MRN: [id number] Date of Birth: 1955/10/15 Referring Provider: Kenn File MD   Encounter Date: 12/02/2017  PT End of Session - 12/02/17 1514    Visit Number  5    Number of Visits  12    Date for PT Re-Evaluation  12/29/17    PT Start Time  0145    PT Stop Time  0239    PT Time Calculation (min)  54 min       Past Medical History:  Diagnosis Date  . Allergy   . Arthritis    ostoarthritis  . Basal cell carcinoma   . Essential hypertension    pt states not currently being treated for this  . History of pneumonia 1963  . Low back pain   . Mixed hyperlipidemia   . Type 2 diabetes mellitus ([place])    pt stated not currently being treated for this    Past Surgical History:  Procedure Laterality Date  . APPENDECTOMY    . CHOLECYSTECTOMY    . PLANTAR FASCIA SURGERY Right 2012  . stimulation system implant  2015  . THYROID SURGERY  2005  . TONSILLECTOMY    . TUBAL LIGATION  1993  . Uterine polyp removal      There were no vitals filed for this visit.  Subjective Assessment - 12/02/17 1353    Subjective  I was doing housework and my pain went to a 6/10 today.    Pain Score  6     Pain Location  Back    Pain Descriptors / Indicators  Dull;Discomfort    Pain Onset  More than a month ago                       [facility] Adult PT Treatment/Exercise - 12/02/17 0001      Exercises   Exercises  Knee/Hip      Lumbar Exercises: Aerobic   Nustep  Level 3 x 15 minutes monitored for draw-in activation and at cadenece of at least 60/minute.      Modalities   Modalities  Electrical Stimulation;Moist Heat      Moist Heat Therapy   Number Minutes Moist Heat  20 Minutes    Moist Heat Location  Lumbar Spine      Electrical Stimulation   Electrical Stimulation  Location  Right low back.    Electrical Stimulation Action  Pre-mod at 80-150 hz x 20 minutes on constant.      Manual Therapy   Manual Therapy  Soft tissue mobilization    Soft tissue mobilization  Right QL release and ischemic release x 10 minutes.             PT Education - 12/02/17 1424    Education provided  Yes    Education Details  INSTRUCT IN DRAW-IN EXERCISE.    Person(s) Educated  Patient    Methods  Explanation;Demonstration    Comprehension  Verbalized understanding;Returned demonstration          PT Long Term Goals - 11/17/17 1356      PT LONG TERM GOAL #1   Title  Independent with a HEP.    Time  6    Period  Weeks    Status  New      PT LONG TERM GOAL #2  Title  Sit 30 minutes with pain not > 3/10.    Time  6    Period  Weeks    Status  New      PT LONG TERM GOAL #3   Title  Sit 30 minutes with pain not > 3/10.    Time  6    Period  Weeks    Status  New      PT LONG TERM GOAL #4   Title  Perform ADL's with pain not > 3/10.    Time  6    Period  Weeks    Status  New            Plan - 12/02/17 1510    Clinical Impression Statement  Good response to treatment.today.  She had a flare-up due to housework activites but felt better after treatment.      PT Treatment/Interventions  ADLs/Self Care Home Management;Cryotherapy;Electrical Stimulation;Moist Heat;Therapeutic activities;Therapeutic exercise;Functional mobility training;Patient/family education;Manual techniques    Consulted and Agree with Plan of Care  Patient       Patient will benefit from skilled therapeutic intervention in order to improve the following deficits and impairments:  Decreased activity tolerance, Pain  Visit Diagnosis: Chronic bilateral low back pain without sciatica     Problem List Patient Active Problem List   Diagnosis Date Noted  . DDD (degenerative disc disease), lumbar 11/12/2017  . Healthcare maintenance 05/07/2016  . Mixed hyperlipidemia  09/14/2014  . PVC's (premature ventricular contractions) 09/14/2014  . Routine gynecological examination 08/22/2013  . Type 2 diabetes mellitus ([place]) 08/22/2013  . Essential hypertension 08/22/2013    Antoneo Ghrist, [place] MPT 12/02/2017, 3:16 PM  [facility] [address] [place], [place], [address] Phone: [phone]   Fax:  [phone]  Name: Lauren Shannon MRN: [id number] Date of Birth: [birth year]

## 2017-12-07 ENCOUNTER — Encounter: Payer: Self-pay | Admitting: Physical Therapy

## 2017-12-07 ENCOUNTER — Ambulatory Visit: Payer: BLUE CROSS/BLUE SHIELD | Admitting: Physical Therapy

## 2017-12-07 DIAGNOSIS — G8929 Other chronic pain: Secondary | ICD-10-CM

## 2017-12-07 DIAGNOSIS — M545 Low back pain: Secondary | ICD-10-CM | POA: Diagnosis not present

## 2017-12-07 NOTE — Therapy (Signed)
[facility] [place], [place], [address] Phone: [phone]   Fax:  [phone]  Physical Therapy Treatment  Patient Details  Name: Lauren Shannon MRN: [id number] Date of Birth: [birth year] Referring Provider: Kenn File MD   Encounter Date: 12/07/2017  PT End of Session - 12/07/17 1644    Visit Number  6    Number of Visits  12    Date for PT Re-Evaluation  12/29/17    PT Start Time  3220    PT Stop Time  1731    PT Time Calculation (min)  47 min    Activity Tolerance  Patient tolerated treatment well    Behavior During Therapy  [facility] for tasks assessed/performed       Past Medical History:  Diagnosis Date  . Allergy   . Arthritis    ostoarthritis  . Basal cell carcinoma   . Essential hypertension    pt states not currently being treated for this  . History of pneumonia 1963  . Low back pain   . Mixed hyperlipidemia   . Type 2 diabetes mellitus ([place])    pt stated not currently being treated for this    Past Surgical History:  Procedure Laterality Date  . APPENDECTOMY    . CHOLECYSTECTOMY    . PLANTAR FASCIA SURGERY Right 2012  . stimulation system implant  2015  . THYROID SURGERY  2005  . TONSILLECTOMY    . TUBAL LIGATION  1993  . Uterine polyp removal      There were no vitals filed for this visit.  Subjective Assessment - 12/07/17 1642    Subjective  Reports that her LBP is better and is having a good day today. Reports that clinical pharmacist said to complete PT secondary to osteoporosis and DDD.    Pertinent History  OA; OP; DDD.    Limitations  Sitting    How long can you sit comfortably?  30 minutes.    How long can you walk comfortably?  Short community distances.    Diagnostic tests  DEXA scan.    Patient Stated Goals  To be able to exercise again.    Currently in Pain?  Yes    Pain Score  1     Pain Location  Back    Pain Orientation  Lower    Pain Descriptors / Indicators  Discomfort     Pain Type  Chronic pain    Pain Onset  More than a month ago         [facility] PT Assessment - 12/07/17 0001      Assessment   Medical Diagnosis  Lumbar DDD.    Next MD Visit  None      Restrictions   Weight Bearing Restrictions  No                   [facility] Adult PT Treatment/Exercise - 12/07/17 0001      Lumbar Exercises: Aerobic   Nustep  L5 x16 min      Lumbar Exercises: Standing   Shoulder Extension  Strengthening;Both;20 reps;Limitations      Lumbar Exercises: Supine   Ab Set  15 reps;5 seconds    Bridge  Other (comment) 2x15 reps      Knee/Hip Exercises: Standing   Hip Abduction  AROM;Both;20 reps;Knee straight    Forward Step Up  Both;20 reps;Hand Hold: 2;Step Height: 6"      Knee/Hip Exercises: Seated   Sit to [employer]  20 reps;without UE support      Modalities   Modalities  Electrical Stimulation;Moist Heat      Moist Heat Therapy   Number Minutes Moist Heat  15 Minutes    Moist Heat Location  Lumbar Spine      Electrical Stimulation   Electrical Stimulation Location  B low back    Electrical Stimulation Action  Pre-Mod    Electrical Stimulation Parameters  80-150 hz x15 min    Electrical Stimulation Goals  Pain                  PT Long Term Goals - 11/17/17 1356      PT LONG TERM GOAL #1   Title  Independent with a HEP.    Time  6    Period  Weeks    Status  New      PT LONG TERM GOAL #2   Title  Sit 30 minutes with pain not > 3/10.    Time  6    Period  Weeks    Status  New      PT LONG TERM GOAL #3   Title  Sit 30 minutes with pain not > 3/10.    Time  6    Period  Weeks    Status  New      PT LONG TERM GOAL #4   Title  Perform ADL's with pain not > 3/10.    Time  6    Period  Weeks    Status  New            Plan - 12/07/17 1744    Clinical Impression Statement  Patient tolerated today's well with progression into weightbearing and core activation exercises. Patient reported only L HS cramping and did  not report any increased pain during treatment. Normal modalities response noted following removal of the modalities    Rehab Potential  Good    PT Frequency  2x / week    PT Duration  6 weeks    PT Treatment/Interventions  ADLs/Self Care Home Management;Cryotherapy;Electrical Stimulation;Moist Heat;Therapeutic activities;Therapeutic exercise;Functional mobility training;Patient/family education;Manual techniques    PT Next Visit Plan  No spinal loading.  Begin with draw-ins and hip bridges; SKTC stretch; weight bearing exercise.  Modalites and STW/M to right QL.    Consulted and Agree with Plan of Care  Patient       Patient will benefit from skilled therapeutic intervention in order to improve the following deficits and impairments:  Decreased activity tolerance, Pain  Visit Diagnosis: Chronic bilateral low back pain without sciatica     Problem List Patient Active Problem List   Diagnosis Date Noted  . DDD (degenerative disc disease), lumbar 11/12/2017  . Healthcare maintenance 05/07/2016  . Mixed hyperlipidemia 09/14/2014  . PVC's (premature ventricular contractions) 09/14/2014  . Routine gynecological examination 08/22/2013  . Type 2 diabetes mellitus ([place]) 08/22/2013  . Essential hypertension 08/22/2013    Standley Brooking, PTA 12/07/2017, 5:52 PM  [facility] [address] [place], [place], [address] Phone: [phone]   Fax:  [phone]  Name: Faylinn Schwenn MRN: [id number] Date of Birth: May 02, 1956

## 2017-12-09 ENCOUNTER — Encounter: Payer: Self-pay | Admitting: Physical Therapy

## 2017-12-09 ENCOUNTER — Ambulatory Visit: Payer: BLUE CROSS/BLUE SHIELD | Admitting: Physical Therapy

## 2017-12-09 DIAGNOSIS — M545 Low back pain: Secondary | ICD-10-CM | POA: Diagnosis not present

## 2017-12-09 DIAGNOSIS — G8929 Other chronic pain: Secondary | ICD-10-CM | POA: Diagnosis not present

## 2017-12-09 NOTE — Therapy (Signed)
[facility] [place], [place], [address] Phone: [phone]   Fax:  [phone]  Physical Therapy Treatment  Patient Details  Name: Lauren Shannon MRN: [id number] Date of Birth: 19-Jul-1956 Referring Provider: Kenn File MD   Encounter Date: 12/09/2017  PT End of Session - 12/09/17 1734    Visit Number  7    Number of Visits  12    Date for PT Re-Evaluation  12/29/17    PT Start Time  0439    PT Stop Time  0545    PT Time Calculation (min)  66 min    Activity Tolerance  Patient tolerated treatment well    Behavior During Therapy  [facility] for tasks assessed/performed       Past Medical History:  Diagnosis Date  . Allergy   . Arthritis    ostoarthritis  . Basal cell carcinoma   . Essential hypertension    pt states not currently being treated for this  . History of pneumonia 1963  . Low back pain   . Mixed hyperlipidemia   . Type 2 diabetes mellitus ([place])    pt stated not currently being treated for this    Past Surgical History:  Procedure Laterality Date  . APPENDECTOMY    . CHOLECYSTECTOMY    . PLANTAR FASCIA SURGERY Right 2012  . stimulation system implant  2015  . THYROID SURGERY  2005  . TONSILLECTOMY    . TUBAL LIGATION  1993  . Uterine polyp removal      There were no vitals filed for this visit.  Subjective Assessment - 12/09/17 1734    Subjective  I would like to try dry needling on a Friday.    Patient Stated Goals  To be able to exercise again.    Currently in Pain?  Yes    Pain Score  1                        [facility] Adult PT Treatment/Exercise - 12/09/17 0001      Exercises   Exercises  Knee/Hip      Lumbar Exercises: Aerobic   Nustep  Level 5 x 15 minutes with step/minute at 70.      Lumbar Exercises: Standing   Other Standing Lumbar Exercises  Pink XTS scapular retraction and seated lat pulldown to fatigue x 2.      Lumbar Exercises: Prone   Other Prone Lumbar  Exercises  Prone leg lifts 10 reps each side.      Modalities   Modalities  Electrical Stimulation;Moist Heat      Moist Heat Therapy   Number Minutes Moist Heat  20 Minutes    Moist Heat Location  Lumbar Spine      Electrical Stimulation   Electrical Stimulation Location  Bilateral low back.    Electrical Stimulation Action  4 electrode NMS on constant x 20 minutes.      Manual Therapy   Manual Therapy  Soft tissue mobilization    Soft tissue mobilization  In prone over pillow.  STW/M x 12 minutes with emphasis on right QL (included ischemic release technique).                  PT Long Term Goals - 11/17/17 1356      PT LONG TERM GOAL #1   Title  Independent with a HEP.    Time  6    Period  Weeks  Status  New      PT LONG TERM GOAL #2   Title  Sit 30 minutes with pain not > 3/10.    Time  6    Period  Weeks    Status  New      PT LONG TERM GOAL #3   Title  Sit 30 minutes with pain not > 3/10.    Time  6    Period  Weeks    Status  New      PT LONG TERM GOAL #4   Title  Perform ADL's with pain not > 3/10.    Time  6    Period  Weeks    Status  New            Plan - 12/09/17 1739    Clinical Impression Statement  Excellent response to treatment.  This has been a good week with lowered pain-levels.  Patient is interested in try dry needling.    PT Treatment/Interventions  ADLs/Self Care Home Management;Cryotherapy;Electrical Stimulation;Moist Heat;Therapeutic activities;Therapeutic exercise;Functional mobility training;Patient/family education;Manual techniques    PT Next Visit Plan  No spinal loading.  Begin with draw-ins and hip bridges; SKTC stretch; weight bearing exercise.  Modalites and STW/M to right QL.    Consulted and Agree with Plan of Care  Patient       Patient will benefit from skilled therapeutic intervention in order to improve the following deficits and impairments:     Visit Diagnosis: Chronic bilateral low back pain without  sciatica     Problem List Patient Active Problem List   Diagnosis Date Noted  . DDD (degenerative disc disease), lumbar 11/12/2017  . Healthcare maintenance 05/07/2016  . Mixed hyperlipidemia 09/14/2014  . PVC's (premature ventricular contractions) 09/14/2014  . Routine gynecological examination 08/22/2013  . Type 2 diabetes mellitus ([place]) 08/22/2013  . Essential hypertension 08/22/2013    APPLE[place], [place] MPT 12/09/2017, 5:48 PM  [facility] [address] [place], [place], [address] Phone: [phone]   Fax:  [phone]  Name: Lauren Shannon MRN: [id number] Date of Birth: May 14, 1956

## 2017-12-09 NOTE — Patient Instructions (Addendum)
[facility]  DRY NEEDLING CONSENT FORM   Trigger point dry needling is a physical therapy approach to treat Myofascial Pain and Dysfunction.  Dry Needling (DN) is a valuable and effective way to deactivate myofascial trigger points (muscle knots/pain). It is skilled intervention that uses a thin filiform needle to penetrate the skin and stimulate underlying myofascial trigger points, muscular, and connective tissues for the management of neuromusculoskeletal pain and movement impairments.  A local twitch response (LTR) will be elicited.  This can sometimes feel like a deep ache in the muscle during the procedure. Multiple trigger points in multiple muscles can be treated during each treatment.  No medication of any kind is injected.   As with any medical treatment and procedure, there are possible adverse events.  While significant adverse events are uncommon, they do sometimes occur and must be considered prior to giving consent.  1. Dry needling often causes a "post needling soreness".  There can be an increase in pain from a couple of hours to 2-3 days, followed by an improvement in the overall pain state. 2. Any time a needle is used there is a risk of infection.  However, we are using new, sterile, and disposable needles; infections are extremely rare. 3. There is a possibility that you may bleed or bruise.  You may feel tired and some nausea following treatment. 4. There is a rare possibility of a pneumothorax (air in the chest cavity). 5. Allergic reaction to nickel in the stainless steel needle. 6. If a nerve is touched, it may cause paresthesia (a prickling/shock sensation) which is usually brief, but may continue for a couple of days.  Following treatment stay hydrated.  Continue regular activities but not too vigorous initially after treatment for 24-48 hours.  Dry Needling is best when combined with other physical therapy interventions such as  strengthening, stretching and other therapeutic modalities.   PLEASE ANSWER THE FOLLOWING QUESTIONS:  Do you have a lack of sensation?   Y/N  Do you have a phobia or fear of needles  Y/N  Are you pregnant?    Y/N If yes:  How many weeks? __________ Do you have any implanted devices?  Y/N If yes:  Pacemaker/Spinal Cord Stimulator/Deep Brain Stimulator/Insulin Pump/Other: ________________ Do you have any implants?  Y/N If yes: Breast/Facial/Pecs/Buttocks/Calves/Hip  Replacement/ Knee Replacement/Other: _________ Do you take any blood thinners?   Y/N If yes: Coumadin (Warfarin)/Other: ___________________ Do you have a bleeding disorder?   Y/N If yes: What kind: _________________________________ Do you take any immunosuppressants?  Y/N If yes:   What kind: _________________________________ Do you take anti-inflammatories?   Y/N If yes: What kind: Advil/Aspirin/Other: ________________ Have you ever been diagnosed with Scoliosis? Y/N Have you had back surgery?   Y/N If yes:  Laminectomy/Fusion/Other: ___________________   I have read, or had read to me, the above.  I have had the opportunity to ask any questions.  All of my questions have been answered to my satisfaction and I understand the risks involved with dry needling.  I consent to examination and treatment at [facility], including dry needling, of any and all of my involved and affected muscles.  

## 2017-12-14 ENCOUNTER — Ambulatory Visit: Payer: BLUE CROSS/BLUE SHIELD | Admitting: Physical Therapy

## 2017-12-14 ENCOUNTER — Encounter: Payer: Self-pay | Admitting: Physical Therapy

## 2017-12-14 DIAGNOSIS — G8929 Other chronic pain: Secondary | ICD-10-CM | POA: Diagnosis not present

## 2017-12-14 DIAGNOSIS — M545 Low back pain: Secondary | ICD-10-CM | POA: Diagnosis not present

## 2017-12-14 NOTE — Therapy (Signed)
[facility] [place], [place], [address] Phone: [phone]   Fax:  [phone]  Physical Therapy Treatment  Patient Details  Name: Lauren Shannon MRN: [id number] Date of Birth: Jun 20, 1956 Referring Provider: Kenn File MD   Encounter Date: 12/14/2017  PT End of Session - 12/14/17 1759    Visit Number  8    Number of Visits  12    Date for PT Re-Evaluation  12/29/17    PT Start Time  0445    PT Stop Time  0541    PT Time Calculation (min)  56 min       Past Medical History:  Diagnosis Date  . Allergy   . Arthritis    ostoarthritis  . Basal cell carcinoma   . Essential hypertension    pt states not currently being treated for this  . History of pneumonia 1963  . Low back pain   . Mixed hyperlipidemia   . Type 2 diabetes mellitus ([place])    pt stated not currently being treated for this    Past Surgical History:  Procedure Laterality Date  . APPENDECTOMY    . CHOLECYSTECTOMY    . PLANTAR FASCIA SURGERY Right 2012  . stimulation system implant  2015  . THYROID SURGERY  2005  . TONSILLECTOMY    . TUBAL LIGATION  1993  . Uterine polyp removal      There were no vitals filed for this visit.  Subjective Assessment - 12/14/17 1804    Subjective  Pain at a 5/10.    Pain Score  5     Pain Location  Back    Pain Orientation  Lower    Pain Type  Chronic pain    Pain Onset  More than a month ago                       [facility] Adult PT Treatment/Exercise - 12/14/17 0001      Exercises   Exercises  Knee/Hip      Lumbar Exercises: Aerobic   Nustep  level 5 x 17 minutes.      Lumbar Exercises: Machines for Strengthening   Leg Press  1 plate x 3 minutes.    Other Lumbar Machine Exercise  50# x 3 minutes.      Modalities   Modalities  Electrical Stimulation;Moist Heat      Moist Heat Therapy   Number Minutes Moist Heat  20 Minutes    Moist Heat Location  Lumbar Spine      Electrical  Stimulation   Electrical Stimulation Location  Bilateral low back    Electrical Stimulation Action  IFC    Electrical Stimulation Parameters  80-150 Hz x 20 minutes.    Electrical Stimulation Goals  Pain                  PT Long Term Goals - 11/17/17 1356      PT LONG TERM GOAL #1   Title  Independent with a HEP.    Time  6    Period  Weeks    Status  New      PT LONG TERM GOAL #2   Title  Sit 30 minutes with pain not > 3/10.    Time  6    Period  Weeks    Status  New      PT LONG TERM GOAL #3   Title  Sit 30 minutes with pain  not > 3/10.    Time  6    Period  Weeks    Status  New      PT LONG TERM GOAL #4   Title  Perform ADL's with pain not > 3/10.    Time  6    Period  Weeks    Status  New            Plan - 12/14/17 1808    Clinical Impression Statement  Excellent response to treatment with pain reduced to a 1/10 at conclusion of treatment.    PT Treatment/Interventions  ADLs/Self Care Home Management;Cryotherapy;Electrical Stimulation;Moist Heat;Therapeutic activities;Therapeutic exercise;Functional mobility training;Patient/family education;Manual techniques    PT Next Visit Plan  No spinal loading.  Begin with draw-ins and hip bridges; SKTC stretch; weight bearing exercise.  Modalites and STW/M to right QL.    Consulted and Agree with Plan of Care  Patient       Patient will benefit from skilled therapeutic intervention in order to improve the following deficits and impairments:  Decreased activity tolerance, Pain  Visit Diagnosis: Chronic bilateral low back pain without sciatica     Problem List Patient Active Problem List   Diagnosis Date Noted  . DDD (degenerative disc disease), lumbar 11/12/2017  . Healthcare maintenance 05/07/2016  . Mixed hyperlipidemia 09/14/2014  . PVC's (premature ventricular contractions) 09/14/2014  . Routine gynecological examination 08/22/2013  . Type 2 diabetes mellitus ([place]) 08/22/2013  . Essential  hypertension 08/22/2013    APPLEGATE, [place] MPT 12/14/2017, 6:09 PM  [facility] [address] [place], [place], [address] Phone: [phone]   Fax:  [phone]  Name: Lauren Shannon MRN: [id number] Date of Birth: 01-26-1956

## 2017-12-16 ENCOUNTER — Ambulatory Visit: Payer: BLUE CROSS/BLUE SHIELD | Admitting: Physical Therapy

## 2017-12-16 ENCOUNTER — Encounter: Payer: Self-pay | Admitting: Physical Therapy

## 2017-12-16 DIAGNOSIS — G8929 Other chronic pain: Secondary | ICD-10-CM

## 2017-12-16 DIAGNOSIS — M545 Low back pain: Secondary | ICD-10-CM | POA: Diagnosis not present

## 2017-12-16 NOTE — Therapy (Signed)
[facility] [place], [place], [address] Phone: [phone]   Fax:  [phone]  Physical Therapy Treatment  Patient Details  Name: Lauren Shannon MRN: [id number] Date of Birth: May 11, 1956 Referring Provider: Kenn File MD   Encounter Date: 12/16/2017  PT End of Session - 12/16/17 1738    Visit Number  9    Number of Visits  12    Date for PT Re-Evaluation  12/29/17    PT Start Time  0445    PT Stop Time  0555    PT Time Calculation (min)  70 min    Activity Tolerance  Patient tolerated treatment well    Behavior During Therapy  [facility] for tasks assessed/performed       Past Medical History:  Diagnosis Date  . Allergy   . Arthritis    ostoarthritis  . Basal cell carcinoma   . Essential hypertension    pt states not currently being treated for this  . History of pneumonia 1963  . Low back pain   . Mixed hyperlipidemia   . Type 2 diabetes mellitus ([place])    pt stated not currently being treated for this    Past Surgical History:  Procedure Laterality Date  . APPENDECTOMY    . CHOLECYSTECTOMY    . PLANTAR FASCIA SURGERY Right 2012  . stimulation system implant  2015  . THYROID SURGERY  2005  . TONSILLECTOMY    . TUBAL LIGATION  1993  . Uterine polyp removal      There were no vitals filed for this visit.  Subjective Assessment - 12/16/17 1743    Subjective  Doing good today with pain at 3/10.    Currently in Pain?  Yes    Pain Score  3     Pain Orientation  Lower    Pain Descriptors / Indicators  Discomfort    Pain Type  Chronic pain    Pain Onset  More than a month ago                       [facility] Adult PT Treatment/Exercise - 12/16/17 0001      Exercises   Exercises  Knee/Hip      Lumbar Exercises: Aerobic   Nustep  Level 5 x 15 minutes.      Lumbar Exercises: Machines for Strengthening   Leg Press  1 plate x 2 minutes.      Modalities   Modalities  Electrical  Stimulation;Moist Heat      Moist Heat Therapy   Number Minutes Moist Heat  20 Minutes    Moist Heat Location  Lumbar Spine      Electrical Stimulation   Electrical Stimulation Location  Bilateral LB.    Electrical Stimulation Action  IFC    Electrical Stimulation Parameters  80-150 Hz x 20 minutes.    Electrical Stimulation Goals  Pain      Manual Therapy   Manual Therapy  Soft tissue mobilization    Soft tissue mobilization  In left sdly position:  STW/M with focus on right QL release x 17 minutes.                  PT Long Term Goals - 11/17/17 1356      PT LONG TERM GOAL #1   Title  Independent with a HEP.    Time  6    Period  Weeks    Status  New  PT LONG TERM GOAL #2   Title  Sit 30 minutes with pain not > 3/10.    Time  6    Period  Weeks    Status  New      PT LONG TERM GOAL #3   Title  Sit 30 minutes with pain not > 3/10.    Time  6    Period  Weeks    Status  New      PT LONG TERM GOAL #4   Title  Perform ADL's with pain not > 3/10.    Time  6    Period  Weeks    Status  New            Plan - 12/16/17 1750    Clinical Impression Statement  The patient has responed very well to treatments thus far with a consistent reduction in her average daily pain-level.  Her right QL was tuat today but responded favorably to STW/M.    PT Treatment/Interventions  ADLs/Self Care Home Management;Cryotherapy;Electrical Stimulation;Moist Heat;Therapeutic activities;Therapeutic exercise;Functional mobility training;Patient/family education;Manual techniques    PT Next Visit Plan  No spinal loading.  Begin with draw-ins and hip bridges; SKTC stretch; weight bearing exercise.  Modalites and STW/M to right QL.    Consulted and Agree with Plan of Care  Patient       Patient will benefit from skilled therapeutic intervention in order to improve the following deficits and impairments:     Visit Diagnosis: Chronic bilateral low back pain without  sciatica     Problem List Patient Active Problem List   Diagnosis Date Noted  . DDD (degenerative disc disease), lumbar 11/12/2017  . Healthcare maintenance 05/07/2016  . Mixed hyperlipidemia 09/14/2014  . PVC's (premature ventricular contractions) 09/14/2014  . Routine gynecological examination 08/22/2013  . Type 2 diabetes mellitus ([place]) 08/22/2013  . Essential hypertension 08/22/2013    Ayen Viviano, [place] MPT 12/16/2017, 6:04 PM  [facility] [address] [place], [place], [address] Phone: [phone]   Fax:  [phone]  Name: Rozalyn Osland MRN: [id number] Date of Birth: 16-Nov-1955

## 2017-12-21 ENCOUNTER — Ambulatory Visit (INDEPENDENT_AMBULATORY_CARE_PROVIDER_SITE_OTHER): Payer: Self-pay

## 2017-12-21 DIAGNOSIS — Z1211 Encounter for screening for malignant neoplasm of colon: Secondary | ICD-10-CM

## 2017-12-21 MED ORDER — NA SULFATE-K SULFATE-MG SULF 17.5-3.13-1.6 GM/177ML PO SOLN: 1.0000 | ORAL | 0 refills | Status: DC

## 2017-12-21 NOTE — Patient Instructions (Addendum)
Lauren Shannon  [birth year] MRN: [id number]     Procedure Date: 01/28/18 Time to register: 12:00 Place to register: [place] [facility] Procedure Time: 1:00 Scheduled provider: Barney Drain, MD    PREPARATION FOR COLONOSCOPY WITH SUPREP BOWEL PREP KIT  Note: Suprep Bowel Prep Kit is a split-dose (2day) regimen. Consumption of BOTH 6-ounce bottles is required for a complete prep.  Please notify us immediately if you are diabetic, take iron supplements, or if you are on Coumadin or any other blood thinners.                                                                                                                                                    1 DAY BEFORE PROCEDURE:  DATE: 01/27/18   DAY: Thursday Continue clear liquids the entire day - NO SOLID FOOD.     At 6:00pm: Complete steps 1 through 4 below, using ONE (1) 6-ounce bottle, before going to bed. Step 1:  Pour ONE (1) 6-ounce bottle of SUPREP liquid into the mixing container.  Step 2:  Add cool drinking water to the 16 ounce line on the container and mix.  Note: Dilute the solution concentrate as directed prior to use. Step 3:  DRINK ALL the liquid in the container. Step 4:  You MUST drink an additional two (2) or more 16 ounce containers of water over the next one (1) hour.   Continue clear liquids only, until midnight. Do not eat or drink anything after midnight. EXCEPTION: If you take medications for your heart, blood pressure, or breathing, you may take these medications with a small amount of clear liquid.   DAY OF PROCEDURE:   DATE: 01/28/18   DAY: Friday    5 hours before your procedure at :8:00am Step 1:  Pour ONE (1) 6-ounce bottle of SUPREP liquid into the mixing container.  Step 2:  Add cool drinking water to the 16 ounce line on the container and mix.  Note: Dilute the solution concentrate as directed prior to use. Step 3:  DRINK ALL the liquid in the container. Step 4:  You MUST drink an additional two  (2) or more 16 ounce containers of water over the next one (1) hour. You MUST complete the final glass of water at least 3 hours before your colonoscopy.   Nothing by mouth past 10:00am  You may take your morning medications with sip of water unless we have instructed otherwise.    Please see below for Dietary Information.  CLEAR LIQUIDS INCLUDE:  Water Jello (NOT red in color)   Ice Popsicles (NOT red in color)   Tea (sugar ok, no milk/cream) Powdered fruit flavored drinks  Coffee (sugar ok, no milk/cream) Gatorade/ Lemonade/ Kool-Aid  (NOT red in color)   Juice: apple, white grape, white cranberry Soft drinks  Clear bullion, consomme,  broth (fat free beef/chicken/vegetable)  Carbonated beverages (any kind)  Strained chicken noodle soup Hard Candy   Remember: Clear liquids are liquids that will allow you to see your fingers on the other side of a clear glass. Be sure liquids are NOT red in color, and not cloudy, but CLEAR.  DO NOT EAT OR DRINK ANY OF THE FOLLOWING:  Dairy products of any kind   Cranberry juice Tomato juice / V8 juice   Grapefruit juice Orange juice     Red grape juice  Do not eat any solid foods, including such foods as: cereal, oatmeal, yogurt, fruits, vegetables, creamed soups, eggs, bread, crackers, pureed foods in a blender, etc.   HELPFUL HINTS FOR DRINKING PREP SOLUTION:   Make sure prep is extremely cold. Mix and refrigerate the the morning of the prep. You may also put in the freezer.   You may try mixing some Crystal Light or Country Time Lemonade if you prefer. Mix in small amounts; add more if necessary.  Try drinking through a straw  Rinse mouth with water or a mouthwash between glasses, to remove after-taste.  Try sipping on a cold beverage /ice/ popsicles between glasses of prep.  Place a piece of sugar-free hard candy in mouth between glasses.  If you become nauseated, try consuming smaller amounts, or stretch out the time between glasses.  Stop for 30-60 minutes, then slowly start back drinking.     OTHER INSTRUCTIONS  You will need a responsible adult at least 61 years of age to accompany you and drive you home. This person must remain in the waiting room during your procedure. The hospital will cancel your procedure if you do not have a responsible adult with you.   1. Wear loose fitting clothing that is easily removed. 2. Leave jewelry and other valuables at home.  3. Remove all body piercing jewelry and leave at home. 4. Total time from sign-in until discharge is approximately 2-3 hours. 5. You should go home directly after your procedure and rest. You can resume normal activities the day after your procedure. 6. The day of your procedure you should not:  Drive  Make legal decisions  Operate machinery  Drink alcohol  Return to work   You may call the office (Dept: [phone]) before 5:00pm, or page the doctor on call [phone]) after 5:00pm, for further instructions, if necessary.   Insurance Information YOU WILL NEED TO CHECK WITH YOUR INSURANCE COMPANY FOR THE BENEFITS OF COVERAGE YOU HAVE FOR THIS PROCEDURE.  UNFORTUNATELY, NOT ALL INSURANCE COMPANIES HAVE BENEFITS TO COVER ALL OR PART OF THESE TYPES OF PROCEDURES.  IT IS YOUR RESPONSIBILITY TO CHECK YOUR BENEFITS, HOWEVER, WE WILL BE GLAD TO ASSIST YOU WITH ANY CODES YOUR INSURANCE COMPANY MAY NEED.    PLEASE NOTE THAT MOST INSURANCE COMPANIES WILL NOT COVER A SCREENING COLONOSCOPY FOR PEOPLE UNDER THE AGE OF 50  IF YOU HAVE BCBS INSURANCE, YOU MAY HAVE BENEFITS FOR A SCREENING COLONOSCOPY BUT IF POLYPS ARE FOUND THE DIAGNOSIS WILL CHANGE AND THEN YOU MAY HAVE A DEDUCTIBLE THAT WILL NEED TO BE MET. SO PLEASE MAKE SURE YOU CHECK YOUR BENEFITS FOR A SCREENING COLONOSCOPY AS WELL AS A DIAGNOSTIC COLONOSCOPY.

## 2017-12-21 NOTE — Progress Notes (Signed)
Gastroenterology Pre-Procedure Review  Request Date:12/21/17 Requesting Physician: Dr.Bradshaw ( pt stated she had one in [facility] in 2010 but she does not remember the doctors name or where she had it done at and she is not sure if she had any polyps or not)  PATIENT REVIEW QUESTIONS: The patient responded to the following health history questions as indicated:    1. Diabetes Melitis: no 2. Joint replacements in the past 12 months: no 3. Major health problems in the past 3 months: no 4. Has an artificial valve or MVP: no 5. Has a defibrillator: no 6. Has been advised in past to take antibiotics in advance of a procedure like teeth cleaning: no 7. Family history of colon cancer: no  8. Alcohol Use: no 9. History of sleep apnea: no  10. History of coronary artery or other vascular stents placed within the last 12 months: no 11. History of any prior anesthesia complications: yes (sometimes it takes awhile to wake up)    MEDICATIONS & ALLERGIES:    Patient reports the following regarding taking any blood thinners:   Plavix? no Aspirin? yes (81mg ) Coumadin? no Brilinta? no Xarelto? no Eliquis? no Pradaxa? no Savaysa? no Effient? no  Patient confirms/reports the following medications:  Current Outpatient Medications  Medication Sig Dispense Refill  . aspirin 81 MG tablet Take 81 mg by mouth daily.    . fluticasone (FLONASE) 50 MCG/ACT nasal spray Place 2 sprays into both nostrils daily. 16 g 6  . levocetirizine (XYZAL) 5 MG tablet Take 5 mg by mouth every evening.    . linaclotide (LINZESS) 145 MCG CAPS capsule Take 1 capsule (145 mcg total) by mouth daily before breakfast. 16 capsule 0  . mirabegron ER (MYRBETRIQ) 50 MG TB24 tablet Take 50 mg by mouth daily.    . Multiple Vitamin (MULTIVITAMIN) capsule Take 1 capsule by mouth daily.    . risedronate (ACTONEL) 150 MG tablet Take 1 tablet (150 mg total) by mouth every 30 (thirty) days. with water on empty stomach, nothing by mouth  or lie down for next 30 minutes. 1 tablet 6  . traZODone (DESYREL) 100 MG tablet TAKE 1/2 TO 1 TABLET AT BEDTIME 90 tablet 1  . LINZESS 145 MCG CAPS capsule Take 1 capsule (145 mcg total) by mouth daily before breakfast. 30 capsule 2  . lovastatin (MEVACOR) 40 MG tablet TAKE 1 TABLET ONCE DAILY AT 6PM (Patient not taking: Reported on 12/21/2017) 90 tablet 3   No current facility-administered medications for this visit.     Patient confirms/reports the following allergies:  No Known Allergies  No orders of the defined types were placed in this encounter.   AUTHORIZATION INFORMATION Primary Insurance: BCBS [place],  [place] #: [id number] Pre-Cert / Josem Kaufmann required: no    SCHEDULE INFORMATION: Procedure has been scheduled as follows:  Date: 01/28/18, Time: 1:00 Location: [facility] Dr[facility]Fields  This Gastroenterology Pre-Precedure Review Form is being routed to the following provider(s): Roseanne Kaufman NP

## 2017-12-22 ENCOUNTER — Other Ambulatory Visit: Payer: Self-pay | Admitting: Obstetrics & Gynecology

## 2017-12-22 ENCOUNTER — Telehealth: Payer: Self-pay | Admitting: Gastroenterology

## 2017-12-22 DIAGNOSIS — Z1231 Encounter for screening mammogram for malignant neoplasm of breast: Secondary | ICD-10-CM

## 2017-12-22 NOTE — Telephone Encounter (Signed)
Patient called and stated that her bcbs said that she was covered at 100% is it was coded as preventive.  Please call her because she said they cover it all if polyps are removed as well.  [phone]

## 2017-12-23 ENCOUNTER — Ambulatory Visit: Payer: BLUE CROSS/BLUE SHIELD | Admitting: *Deleted

## 2017-12-23 DIAGNOSIS — G8929 Other chronic pain: Secondary | ICD-10-CM | POA: Diagnosis not present

## 2017-12-23 DIAGNOSIS — M545 Low back pain: Principal | ICD-10-CM

## 2017-12-23 NOTE — Therapy (Signed)
[facility] [place], [place], [address] Phone: [phone]   Fax:  [phone]  Physical Therapy Treatment  Patient Details  Name: Lauren Shannon MRN: [id number] Date of Birth: [birth year] Referring Provider: Kenn File MD   Encounter Date: 12/23/2017  PT End of Session - 12/23/17 1657    Visit Number  10    Number of Visits  12    Date for PT Re-Evaluation  12/29/17    PT Start Time  4627    PT Stop Time  0350    PT Time Calculation (min)  50 min       Past Medical History:  Diagnosis Date  . Allergy   . Arthritis    ostoarthritis  . Basal cell carcinoma   . Essential hypertension    pt states not currently being treated for this  . History of pneumonia 1963  . Low back pain   . Mixed hyperlipidemia   . Type 2 diabetes mellitus ([place])    pt stated not currently being treated for this    Past Surgical History:  Procedure Laterality Date  . APPENDECTOMY    . CHOLECYSTECTOMY    . PLANTAR FASCIA SURGERY Right 2012  . stimulation system implant  2015  . THYROID SURGERY  2005  . TONSILLECTOMY    . TUBAL LIGATION  1993  . Uterine polyp removal      There were no vitals filed for this visit.                    [facility] Adult PT Treatment/Exercise - 12/23/17 0001      Exercises   Exercises  Knee/Hip      Lumbar Exercises: Aerobic   Nustep  Level 5 x 15 minutes.      Modalities   Modalities  Electrical Stimulation;Moist Heat      Moist Heat Therapy   Number Minutes Moist Heat  20 Minutes    Moist Heat Location  Lumbar Spine      Electrical Stimulation   Electrical Stimulation Location  Bilateral LB. IFC x 20 mins 80-150hz     Electrical Stimulation Goals  Pain      Manual Therapy   Manual Therapy  Soft tissue mobilization    Soft tissue mobilization  In left sdly position:  STW/M with focus on right QL release, LB paras x 16 minutes.                  PT Long Term  Goals - 11/17/17 1356      PT LONG TERM GOAL #1   Title  Independent with a HEP.    Time  6    Period  Weeks    Status  New      PT LONG TERM GOAL #2   Title  Sit 30 minutes with pain not > 3/10.    Time  6    Period  Weeks    Status  New      PT LONG TERM GOAL #3   Title  Sit 30 minutes with pain not > 3/10.    Time  6    Period  Weeks    Status  New      PT LONG TERM GOAL #4   Title  Perform ADL's with pain not > 3/10.    Time  6    Period  Weeks    Status  New  Plan - 12/23/17 1803    Clinical Impression Statement  Pt arrived today doing fairly well and feels that Rxs are helping still with decreased pain during ADL's . She reports that RT side still feels tight. She had notable tautness i RT QL , but good release after TPR and STW.    Rehab Potential  Good    PT Frequency  2x / week    PT Treatment/Interventions  ADLs/Self Care Home Management;Cryotherapy;Electrical Stimulation;Moist Heat;Therapeutic activities;Therapeutic exercise;Functional mobility training;Patient/family education;Manual techniques    PT Next Visit Plan  No spinal loading.  Begin with draw-ins and hip bridges; SKTC stretch; weight bearing exercise.  Modalites and STW/M to right QL.       Patient will benefit from skilled therapeutic intervention in order to improve the following deficits and impairments:  Decreased activity tolerance, Pain  Visit Diagnosis: Chronic bilateral low back pain without sciatica     Problem List Patient Active Problem List   Diagnosis Date Noted  . DDD (degenerative disc disease), lumbar 11/12/2017  . Healthcare maintenance 05/07/2016  . Mixed hyperlipidemia 09/14/2014  . PVC's (premature ventricular contractions) 09/14/2014  . Routine gynecological examination 08/22/2013  . Type 2 diabetes mellitus ([place]) 08/22/2013  . Essential hypertension 08/22/2013    Lauren Shannon,CHRIS, PTA 12/23/2017, 6:07 PM  [facility] [address] [place], [place], [address] Phone: [phone]   Fax:  [phone]  Name: Lauren Shannon MRN: [id number] Date of Birth: 1956/07/04

## 2017-12-23 NOTE — Telephone Encounter (Signed)
Lauren Shannon and I both spoke with the pt. Lauren Shannon explained to the pt how things are coded and advised the pt to call [employer] back , talk with them and get a reference number and the name of the person she spoke with.

## 2017-12-28 ENCOUNTER — Ambulatory Visit: Payer: BLUE CROSS/BLUE SHIELD | Admitting: *Deleted

## 2017-12-28 DIAGNOSIS — M545 Low back pain: Secondary | ICD-10-CM | POA: Diagnosis not present

## 2017-12-28 DIAGNOSIS — G8929 Other chronic pain: Secondary | ICD-10-CM

## 2017-12-28 NOTE — Progress Notes (Signed)
Reviewed meds. On trazodone nightly. Has she been on this long-term? Also concerning is her reports of difficulty awaking after anesthesia. May be best served with Propofol. Is there an office visit available for prior to procedure?

## 2017-12-28 NOTE — Therapy (Signed)
[facility] [place], [place], [address] Phone: [phone]   Fax:  [phone]  Physical Therapy Treatment  Patient Details  Name: Lauren Shannon MRN: [id number] Date of Birth: 1956-08-10 Referring Provider: Kenn File MD   Encounter Date: 12/28/2017  PT End of Session - 12/28/17 1748    Visit Number  11    Number of Visits  12    Date for PT Re-Evaluation  12/29/17    PT Start Time  8527    PT Stop Time  1736    PT Time Calculation (min)  51 min       Past Medical History:  Diagnosis Date  . Allergy   . Arthritis    ostoarthritis  . Basal cell carcinoma   . Essential hypertension    pt states not currently being treated for this  . History of pneumonia 1963  . Low back pain   . Mixed hyperlipidemia   . Type 2 diabetes mellitus ([place])    pt stated not currently being treated for this    Past Surgical History:  Procedure Laterality Date  . APPENDECTOMY    . CHOLECYSTECTOMY    . PLANTAR FASCIA SURGERY Right 2012  . stimulation system implant  2015  . THYROID SURGERY  2005  . TONSILLECTOMY    . TUBAL LIGATION  1993  . Uterine polyp removal      There were no vitals filed for this visit.                    [facility] Adult PT Treatment/Exercise - 12/28/17 0001      Exercises   Exercises  Knee/Hip      Lumbar Exercises: Aerobic   Nustep  Level 5 x 15 minutes.      Lumbar Exercises: Standing   Shoulder Extension  Strengthening;Both XTS pink  3x10      Lumbar Exercises: Supine   Ab Set  15 reps;5 seconds    Heel Slides  20 reps    Dead Bug  20 reps    Bridge  10 reps    Other Supine Lumbar Exercises  Marching from 90/90 hipd/knees 2x6      Modalities   Modalities  Electrical Stimulation;Moist Heat      Moist Heat Therapy   Number Minutes Moist Heat  20 Minutes    Moist Heat Location  Lumbar Spine      Electrical Stimulation   Electrical Stimulation Location  Bilateral LB. IFC  x 20 mins 80-150hz     Electrical Stimulation Goals  Pain                  PT Long Term Goals - 12/23/17 1807      PT LONG TERM GOAL #1   Title  Independent with a HEP.    Time  6    Period  Weeks    Status  Achieved      PT LONG TERM GOAL #2   Title  Sit 30 minutes with pain not > 3/10.    Time  6    Period  Weeks    Status  On-going      PT LONG TERM GOAL #3   Title  Sit 30 minutes with pain not > 3/10.    Period  Weeks    Status  On-going      PT LONG TERM GOAL #4   Title  Perform ADL's with pain not > 3/10.  Time  6    Period  Weeks    Status  On-going            Plan - 12/28/17 1748    Clinical Impression Statement  Pt arrived today doing fairly well and requested to work on core strengthening. She did well through guided therex in standing, supine and prone. No complaints of increased pain. Green tband given for HEP lat pulldowns. Normal modality response.    Clinical Presentation  Stable    Clinical Decision Making  Low    Rehab Potential  Good    PT Frequency  2x / week    PT Duration  6 weeks    PT Treatment/Interventions  ADLs/Self Care Home Management;Cryotherapy;Electrical Stimulation;Moist Heat;Therapeutic activities;Therapeutic exercise;Functional mobility training;Patient/family education;Manual techniques    PT Next Visit Plan  No spinal loading.  Begin with draw-ins and hip bridges; SKTC stretch; weight bearing exercise.  Modalites and STW/M to right QL.    Consulted and Agree with Plan of Care  Patient       Patient will benefit from skilled therapeutic intervention in order to improve the following deficits and impairments:  Decreased activity tolerance, Pain  Visit Diagnosis: Chronic bilateral low back pain without sciatica     Problem List Patient Active Problem List   Diagnosis Date Noted  . DDD (degenerative disc disease), lumbar 11/12/2017  . Healthcare maintenance 05/07/2016  . Mixed hyperlipidemia 09/14/2014  .  PVC's (premature ventricular contractions) 09/14/2014  . Routine gynecological examination 08/22/2013  . Type 2 diabetes mellitus ([place]) 08/22/2013  . Essential hypertension 08/22/2013    Ahaana Rochette,CHRIS, PTA 12/28/2017, 5:55 PM  [facility] [address] [place], [place], [address] Phone: [phone]   Fax:  [phone]  Name: Ayana Imhof MRN: [id number] Date of Birth: 10-29-1955

## 2017-12-29 NOTE — Progress Notes (Signed)
Pt has been on trazadone since 10/2016. Dr.Bradshaw gave it to her to help her sleep. Her procedure would have to be cancelled if you want her on propofol. We dont have any OV until July.

## 2017-12-31 ENCOUNTER — Ambulatory Visit: Payer: BLUE CROSS/BLUE SHIELD | Attending: Family Medicine | Admitting: Physical Therapy

## 2017-12-31 DIAGNOSIS — G8929 Other chronic pain: Secondary | ICD-10-CM | POA: Insufficient documentation

## 2017-12-31 DIAGNOSIS — M545 Low back pain: Secondary | ICD-10-CM | POA: Insufficient documentation

## 2017-12-31 NOTE — Progress Notes (Signed)
As that is the only medication, should be ok.

## 2017-12-31 NOTE — Patient Instructions (Signed)
Piriformis (Supine)  Cross legs, right on top. Gently pull other knee toward chest until stretch is felt in buttock/hip of top leg. Hold _30-60___ seconds. Repeat ____ times per set. Do ____ sets per session. Do __2__ sessions per day. 3 Hip Stretch  Put right ankle over left knee. Let right knee fall downward, but keep ankle in place. Feel the stretch in hip. May push down gently with hand to feel stretch. Hold ____ seconds while counting out loud. Repeat with other leg. Repeat ____ times. Do ____ sessions per day.  Trigger Point Dry Needling  . What is Trigger Point Dry Needling (DN)? o DN is a physical therapy technique used to treat muscle pain and dysfunction. Specifically, DN helps deactivate muscle trigger points (muscle knots).  o A thin filiform needle is used to penetrate the skin and stimulate the underlying trigger point. The goal is for a local twitch response (LTR) to occur and for the trigger point to relax. No medication of any kind is injected during the procedure.   . What Does Trigger Point Dry Needling Feel Like?  o The procedure feels different for each individual patient. Some patients report that they do not actually feel the needle enter the skin and overall the process is not painful. Very mild bleeding may occur. However, many patients feel a deep cramping in the muscle in which the needle was inserted. This is the local twitch response.   Marland Kitchen How Will I feel after the treatment? o Soreness is normal, and the onset of soreness may not occur for a few hours. Typically this soreness does not last longer than two days.  o Bruising is uncommon, however; ice can be used to decrease any possible bruising.  o In rare cases feeling tired or nauseous after the treatment is normal. In addition, your symptoms may get worse before they get better, this period will typically not last longer than 24 hours.   . What Can I do After My Treatment? o Increase your hydration by drinking  more water for the next 24 hours. o You may place ice or heat on the areas treated that have become sore, however, do not use heat on inflamed or bruised areas. Heat often brings more relief post needling. o You can continue your regular activities, but vigorous activity is not recommended initially after the treatment for 24 hours. o DN is best combined with other physical therapy such as strengthening, stretching, and other therapies.  o Try an [employer] to help draw toxins out.   Precautions:  In some cases, dry needling is done over the lung field. While rare, there is a risk of pneumothorax (punctured lung). Because of this, if you ever experience shortness of breath on exertion, difficulty taking a deep breath, chest pain or a dry cough following dry needling, you should report to an emergency room and tell them that you have been dry needled over the thorax.   Madelyn Flavors, PT 12/31/17 11:11 AM [facility] [address] [place], [place], [address] Phone: [phone]   Fax:  [phone]

## 2017-12-31 NOTE — Therapy (Addendum)
[facility] [place], [place], [address] Phone: [phone]   Fax:  [phone]  Physical Therapy Treatment  Patient Details  Name: Lauren Shannon MRN: [id number] Date of Birth: [birth year] Referring Provider: Kenn File MD   Encounter Date: 12/31/2017  PT End of Session - 12/31/17 1039    Visit Number  12    Number of Visits  12    PT Start Time  8841    PT Stop Time  1132    PT Time Calculation (min)  56 min    Activity Tolerance  Patient tolerated treatment well    Behavior During Therapy  [facility] for tasks assessed/performed       Past Medical History:  Diagnosis Date  . Allergy   . Arthritis    ostoarthritis  . Basal cell carcinoma   . Essential hypertension    pt states not currently being treated for this  . History of pneumonia 1963  . Low back pain   . Mixed hyperlipidemia   . Type 2 diabetes mellitus ([place])    pt stated not currently being treated for this    Past Surgical History:  Procedure Laterality Date  . APPENDECTOMY    . CHOLECYSTECTOMY    . PLANTAR FASCIA SURGERY Right 2012  . stimulation system implant  2015  . THYROID SURGERY  2005  . TONSILLECTOMY    . TUBAL LIGATION  1993  . Uterine polyp removal      There were no vitals filed for this visit.  Subjective Assessment - 12/31/17 1039    Subjective  Patient states pain is 3/10 today.    Pertinent History  OA; OP; DDD. interstim bladder implanted device (by left glutes and R psis)    Patient Stated Goals  To be able to exercise again.    Currently in Pain?  Yes    Pain Score  3     Pain Location  Back    Pain Orientation  Lower    Pain Descriptors / Indicators  Discomfort    Pain Type  Chronic pain    Pain Onset  More than a month ago    Pain Frequency  Constant                       [facility] Adult PT Treatment/Exercise - 12/31/17 0001      Lumbar Exercises: Stretches   Figure 4 Stretch  1 rep;60  seconds;Seated;Supine;2 reps    Other Lumbar Stretch Exercise  Supine C stretch for R lateral low back and hip x 60 sec      Modalities   Modalities  Electrical Stimulation;Moist Heat      Moist Heat Therapy   Number Minutes Moist Heat  15 Minutes    Moist Heat Location  Lumbar Spine;Other (comment) gluteals      Electrical Stimulation   Electrical Stimulation Location  Bil lumbar and R gluteals    Electrical Stimulation Goals  Pain      Manual Therapy   Manual Therapy  Soft tissue mobilization    Soft tissue mobilization  to Bil lumbar, R QL and gluteals       Trigger Point Dry Needling - 12/31/17 1119    Consent Given?  Yes    Education Handout Provided  Yes    Muscles Treated Upper Body  Quadratus Lumborum;Longissimus R and multifidi L4/5 Bil    Muscles Treated Lower Body  Gluteus minimus R and glut medius  Longissimus Response  Twitch response elicited;Palpable increased muscle length    Gluteus Minimus Response  Twitch response elicited;Palpable increased muscle length                PT Long Term Goals - 12/31/17 1041      PT LONG TERM GOAL #1   Title  Independent with a HEP.    Time  6    Period  Weeks    Status  Achieved      PT LONG TERM GOAL #2   Title  Sit 30 minutes with pain not > 3/10.    Baseline  4-5/10 at 30 min    Time  6    Period  Weeks    Status  On-going      PT LONG TERM GOAL #4   Title  Perform ADL's with pain not > 3/10.    Baseline  20 min pain increases to 8/10    Time  6    Period  Weeks    Status  On-going            Plan - 12/31/17 1240    Clinical Impression Statement  Patient demonstrated postural deficits indicating tight R QL. She was educated and gave consent to DN. She responded well with ++twitch responses in R gluteals and multifidi.     Rehab Potential  Good    PT Frequency  2x / week    PT Duration  6 weeks    PT Treatment/Interventions  ADLs/Self Care Home Management;Cryotherapy;Electrical  Stimulation;Moist Heat;Therapeutic activities;Therapeutic exercise;Functional mobility training;Patient/family education;Manual techniques    PT Next Visit Plan  Assess DN, continue with RECERT if beneficial; No spinal loading.  Begin with draw-ins and hip bridges; SKTC stretch; weight bearing exercise.  Modalites and STW/M to right QL.    PT Home Exercise Plan  supine C stretch; figure 4    Consulted and Agree with Plan of Care  Patient       Patient will benefit from skilled therapeutic intervention in order to improve the following deficits and impairments:  Decreased activity tolerance, Pain  Visit Diagnosis: Chronic bilateral low back pain without sciatica     Problem List Patient Active Problem List   Diagnosis Date Noted  . DDD (degenerative disc disease), lumbar 11/12/2017  . Healthcare maintenance 05/07/2016  . Mixed hyperlipidemia 09/14/2014  . PVC's (premature ventricular contractions) 09/14/2014  . Routine gynecological examination 08/22/2013  . Type 2 diabetes mellitus ([place]) 08/22/2013  . Essential hypertension 08/22/2013    Madelyn Flavors PT 12/31/2017, 12:45 PM  [facility] [address] [place], [place], [address] Phone: [phone]   Fax:  [phone]  Name: Lauren Shannon MRN: [id number] Date of Birth: 1956-03-16  PHYSICAL THERAPY DISCHARGE SUMMARY  Visits from Start of Care: 12.  Current functional level related to goals / functional outcomes: See above.   Remaining deficits: See below.   Education / Equipment: HEP.  Plan: Patient agrees to discharge.  Patient goals were partially met. Patient is being discharged due to                                                     ?????          Mali Applegate MPT

## 2018-01-07 ENCOUNTER — Other Ambulatory Visit: Payer: BLUE CROSS/BLUE SHIELD

## 2018-01-07 DIAGNOSIS — E782 Mixed hyperlipidemia: Secondary | ICD-10-CM | POA: Diagnosis not present

## 2018-01-07 LAB — LIPID PANEL
Chol/HDL Ratio: 2.9 ratio (ref 0.0–4.4)
Cholesterol, Total: 207 mg/dL — ABNORMAL HIGH (ref 100–199)
HDL: 71 mg/dL (ref >39)
LDL Calculated: 111 mg/dL — ABNORMAL HIGH (ref 0–99)
Triglycerides: 125 mg/dL (ref 0–149)
VLDL Cholesterol Cal: 25 mg/dL (ref 5–40)

## 2018-01-13 ENCOUNTER — Encounter: Payer: Self-pay | Admitting: Family Medicine

## 2018-01-28 ENCOUNTER — Ambulatory Visit (HOSPITAL_COMMUNITY)
Admission: RE | Admit: 2018-01-28 | Discharge: 2018-01-28 | Disposition: A | Discharge status: Home / Self Care | Payer: BLUE CROSS/BLUE SHIELD | Source: Ambulatory Visit | Attending: Gastroenterology | Admitting: Gastroenterology

## 2018-01-28 ENCOUNTER — Other Ambulatory Visit: Payer: Self-pay

## 2018-01-28 ENCOUNTER — Encounter (HOSPITAL_COMMUNITY)
Admission: RE | Disposition: A | Discharge status: Home / Self Care | Payer: Self-pay | Source: Ambulatory Visit | Attending: Gastroenterology

## 2018-01-28 ENCOUNTER — Encounter (HOSPITAL_COMMUNITY): Payer: Self-pay | Admitting: *Deleted

## 2018-01-28 DIAGNOSIS — Z7982 Long term (current) use of aspirin: Secondary | ICD-10-CM | POA: Diagnosis not present

## 2018-01-28 DIAGNOSIS — D122 Benign neoplasm of ascending colon: Secondary | ICD-10-CM | POA: Diagnosis not present

## 2018-01-28 DIAGNOSIS — K648 Other hemorrhoids: Secondary | ICD-10-CM | POA: Insufficient documentation

## 2018-01-28 DIAGNOSIS — E782 Mixed hyperlipidemia: Secondary | ICD-10-CM | POA: Diagnosis not present

## 2018-01-28 DIAGNOSIS — M1990 Unspecified osteoarthritis, unspecified site: Secondary | ICD-10-CM | POA: Diagnosis not present

## 2018-01-28 DIAGNOSIS — Z1211 Encounter for screening for malignant neoplasm of colon: Secondary | ICD-10-CM | POA: Diagnosis not present

## 2018-01-28 DIAGNOSIS — Z85828 Personal history of other malignant neoplasm of skin: Secondary | ICD-10-CM | POA: Insufficient documentation

## 2018-01-28 DIAGNOSIS — Q438 Other specified congenital malformations of intestine: Secondary | ICD-10-CM | POA: Insufficient documentation

## 2018-01-28 DIAGNOSIS — D124 Benign neoplasm of descending colon: Secondary | ICD-10-CM | POA: Diagnosis not present

## 2018-01-28 DIAGNOSIS — K644 Residual hemorrhoidal skin tags: Secondary | ICD-10-CM | POA: Insufficient documentation

## 2018-01-28 DIAGNOSIS — Z79899 Other long term (current) drug therapy: Secondary | ICD-10-CM | POA: Diagnosis not present

## 2018-01-28 HISTORY — DX: Headache: R51

## 2018-01-28 HISTORY — DX: Headache, unspecified: R51.9

## 2018-01-28 HISTORY — PX: COLONOSCOPY: SHX5424

## 2018-01-28 LAB — GLUCOSE, CAPILLARY: Glucose-Capillary: 68 mg/dL (ref 65–99)

## 2018-01-28 SURGERY — COLONOSCOPY
Anesthesia: Moderate Sedation

## 2018-01-28 MED ORDER — MIDAZOLAM HCL 5 MG/5ML IJ SOLN
INTRAMUSCULAR | Status: DC | PRN
  Administered 2018-01-28 (×2): 2 mg via INTRAVENOUS
  Administered 2018-01-28: 1 mg via INTRAVENOUS

## 2018-01-28 MED ORDER — MIDAZOLAM HCL 5 MG/5ML IJ SOLN
INTRAMUSCULAR | Status: AC
  Filled 2018-01-28: qty 10

## 2018-01-28 MED ORDER — MEPERIDINE HCL 100 MG/ML IJ SOLN
INTRAMUSCULAR | Status: DC | PRN
  Administered 2018-01-28 (×3): 25 mg

## 2018-01-28 MED ORDER — SODIUM CHLORIDE 0.9 % IV SOLN
INTRAVENOUS | Status: DC
  Administered 2018-01-28: 1000 mL via INTRAVENOUS

## 2018-01-28 MED ORDER — MEPERIDINE HCL 100 MG/ML IJ SOLN
INTRAMUSCULAR | Status: AC
  Filled 2018-01-28: qty 2

## 2018-01-28 NOTE — Discharge Instructions (Signed)
You had 4 small polyps removed. You have  SMALL internal hemorrhoids.   DRINK WATER TO KEEP YOUR URINE LIGHT YELLOW.  FOLLOW A HIGH FIBER DIET. AVOID ITEMS THAT CAUSE BLOATING & GAS. SEE INFO BELOW.  CONTINUE YOUR WEIGHT LOSS EFFORTS.  WHILE I DO NOT WANT TO ALARM YOU, YOUR BODY MASS INDEX IS OVER 30 WHICH MEANS YOU ARE OBESE. OBESITY IS ASSOCIATED WITH AN INCREASED RISK FOR CIRRHOSIS AND ALL CANCERS, INCLUDING ESOPHAGEAL AND COLON CANCER. A WEIGHT OF 175 LBS OR LESS  WILL GET YOUR BODY MASS INDEX(BMI) UNDER 30.  YOUR BIOPSY RESULTS WILL BE AVAILABLE IN 7 DAYS.   Next colonoscopy in 3 years.    Colonoscopy Care After Read the instructions outlined below and refer to this sheet in the next week. These discharge instructions provide you with general information on caring for yourself after you leave the hospital. While your treatment has been planned according to the most current medical practices available, unavoidable complications occasionally occur. If you have any problems or questions after discharge, call DR. Kristan Votta, [phone].  ACTIVITY  You may resume your regular activity, but move at a slower pace for the next 24 hours.   Take frequent rest periods for the next 24 hours.   Walking will help get rid of the air and reduce the bloated feeling in your belly (abdomen).   No driving for 24 hours (because of the medicine (anesthesia) used during the test).   You may shower.   Do not sign any important legal documents or operate any machinery for 24 hours (because of the anesthesia used during the test).    NUTRITION  Drink plenty of fluids.   You may resume your normal diet as instructed by your doctor.   Begin with a light meal and progress to your normal diet. Heavy or fried foods are harder to digest and may make you feel sick to your stomach (nauseated).   Avoid alcoholic beverages for 24 hours or as instructed.    MEDICATIONS  You may resume your normal  medications.   WHAT YOU CAN EXPECT TODAY  Some feelings of bloating in the abdomen.   Passage of more gas than usual.   Spotting of blood in your stool or on the toilet paper  .  IF YOU HAD POLYPS REMOVED DURING THE COLONOSCOPY:  Eat a soft diet IF YOU HAVE NAUSEA, BLOATING, ABDOMINAL PAIN, OR VOMITING.    FINDING OUT THE RESULTS OF YOUR TEST Not all test results are available during your visit. DR. Oneida Alar WILL CALL YOU WITHIN 14 DAYS OF YOUR PROCEDUE WITH YOUR RESULTS. Do not assume everything is normal if you have not heard from DR. Martyna Thorns, CALL HER OFFICE AT [phone].  SEEK IMMEDIATE MEDICAL ATTENTION AND CALL THE OFFICE: [phone] IF:  You have more than a spotting of blood in your stool.   Your belly is swollen (abdominal distention).   You are nauseated or vomiting.   You have a temperature over 101F.   You have abdominal pain or discomfort that is severe or gets worse throughout the day.   High-Fiber Diet A high-fiber diet changes your normal diet to include more whole grains, legumes, fruits, and vegetables. Changes in the diet involve replacing refined carbohydrates with unrefined foods. The calorie level of the diet is essentially unchanged. The Dietary Reference Intake (recommended amount) for adult males is 38 grams per day. For adult females, it is 25 grams per day. Pregnant and lactating women should consume 28  grams of fiber per day. Fiber is the intact part of a plant that is not broken down during digestion. Functional fiber is fiber that has been isolated from the plant to provide a beneficial effect in the body. PURPOSE  Increase stool bulk.   Ease and regulate bowel movements.   Lower cholesterol.   REDUCE RISK OF COLON CANCER  INDICATIONS THAT YOU NEED MORE FIBER  Constipation and hemorrhoids.   Uncomplicated diverticulosis (intestine condition) and irritable bowel syndrome.   Weight management.   As a protective measure against  hardening of the arteries (atherosclerosis), diabetes, and cancer.   GUIDELINES FOR INCREASING FIBER IN THE DIET  Start adding fiber to the diet slowly. A gradual increase of about 5 more grams (2 slices of whole-wheat bread, 2 servings of most fruits or vegetables, or 1 bowl of high-fiber cereal) per day is best. Too rapid an increase in fiber may result in constipation, flatulence, and bloating.   Drink enough water and fluids to keep your urine clear or pale yellow. Water, juice, or caffeine-free drinks are recommended. Not drinking enough fluid may cause constipation.   Eat a variety of high-fiber foods rather than one type of fiber.   Try to increase your intake of fiber through using high-fiber foods rather than fiber pills or supplements that contain small amounts of fiber.   The goal is to change the types of food eaten. Do not supplement your present diet with high-fiber foods, but replace foods in your present diet.   INCLUDE A VARIETY OF FIBER SOURCES  Replace refined and processed grains with whole grains, canned fruits with fresh fruits, and incorporate other fiber sources. White rice, white breads, and most bakery goods contain little or no fiber.   Brown whole-grain rice, buckwheat oats, and many fruits and vegetables are all good sources of fiber. These include: broccoli, Brussels sprouts, cabbage, cauliflower, beets, sweet potatoes, white potatoes (skin on), carrots, tomatoes, eggplant, squash, berries, fresh fruits, and dried fruits.   Cereals appear to be the richest source of fiber. Cereal fiber is found in whole grains and bran. Bran is the fiber-rich outer coat of cereal grain, which is largely removed in refining. In whole-grain cereals, the bran remains. In breakfast cereals, the largest amount of fiber is found in those with "bran" in their names. The fiber content is sometimes indicated on the label.   You may need to include additional fruits and vegetables each day.     In baking, for 1 cup white flour, you may use the following substitutions:   1 cup whole-wheat flour minus 2 tablespoons.   1/2 cup white flour plus 1/2 cup whole-wheat flour.   Polyps, Colon  A polyp is extra tissue that grows inside your body. Colon polyps grow in the large intestine. The large intestine, also called the colon, is part of your digestive system. It is a long, hollow tube at the end of your digestive tract where your body makes and stores stool. Most polyps are not dangerous. They are benign. This means they are not cancerous. But over time, some types of polyps can turn into cancer. Polyps that are smaller than a pea are usually not harmful. But larger polyps could someday become or may already be cancerous. To be safe, doctors remove all polyps and test them.   WHO GETS POLYPS? Anyone can get polyps, but certain people are more likely than others. You may have a greater chance of getting polyps if:  You are  over 50.   You have had polyps before.   Someone in your family has had polyps.   Someone in your family has had cancer of the large intestine.   Find out if someone in your family has had polyps. You may also be more likely to get polyps if you:   Eat a lot of fatty foods   Smoke   Drink alcohol   Do not exercise  Eat too much   PREVENTION There is not one sure way to prevent polyps. You might be able to lower your risk of getting them if you:  Eat more fruits and vegetables and less fatty food.   Do not smoke.   Avoid alcohol.   Exercise every day.   Lose weight if you are overweight.   Eating more calcium and folate can also lower your risk of getting polyps. Some foods that are rich in calcium are milk, cheese, and broccoli. Some foods that are rich in folate are chickpeas, kidney beans, and spinach.   Hemorrhoids Hemorrhoids are dilated (enlarged) veins around the rectum. Sometimes clots will form in the veins. This makes them swollen and  painful. These are called thrombosed hemorrhoids. Causes of hemorrhoids include:  Constipation.   Straining to have a bowel movement.   HEAVY LIFTING  HOME CARE INSTRUCTIONS  Eat a well balanced diet and drink 6 to 8 glasses of water every day to avoid constipation. You may also use a bulk laxative.   Avoid straining to have bowel movements.   Keep anal area dry and clean.   Do not use a donut shaped pillow or sit on the toilet for long periods. This increases blood pooling and pain.   Move your bowels when your body has the urge; this will require less straining and will decrease pain and pressure.

## 2018-01-28 NOTE — Op Note (Signed)
[facility] Patient Name: Lauren Shannon Procedure Date: 01/28/2018 1:13 PM MRN: [id number] Date of Birth: 09/23/1955 Attending MD: Barney Drain MD, MD CSN: [id number] Age: 62 Admit Type: Outpatient Procedure:                Colonoscopy with COLD FORCEPS/SNARE POLYPECTOMY Indications:              Screening for colorectal malignant neoplasm Providers:                Barney Drain MD, MD, Janeece Riggers, RN, Nelma Rothman,                            Technician Referring MD:             Sherley Bounds. Bradshaw Medicines:                Meperidine 75 mg IV, Midazolam 5 mg IV Complications:            No immediate complications. Estimated Blood Loss:     Estimated blood loss was minimal. Procedure:                Pre-Anesthesia Assessment:                           - Prior to the procedure, a History and Physical                            was performed, and patient medications and                            allergies were reviewed. The patient's tolerance of                            previous anesthesia was also reviewed. The risks                            and benefits of the procedure and the sedation                            options and risks were discussed with the patient.                            All questions were answered, and informed consent                            was obtained. Prior Anticoagulants: The patient has                            taken aspirin, last dose was 2 days prior to                            procedure. ASA Grade Assessment: II - A patient                            with mild systemic disease. After reviewing the  risks and benefits, the patient was deemed in                            satisfactory condition to undergo the procedure.                            After obtaining informed consent, the colonoscope                            was passed under direct vision. Throughout the                            procedure, the patient's  blood pressure, pulse, and                            oxygen saturations were monitored continuously. The                            [id number]) scope was introduced through                            the anus and advanced to the the cecum, identified                            by appendiceal orifice and ileocecal valve. The                            colonoscopy was somewhat difficult due to a                            tortuous colon. Successful completion of the                            procedure was aided by increasing the dose of                            sedation medication, straightening and shortening                            the scope to obtain bowel loop reduction and                            COLOWRAP. The patient tolerated the procedure                            fairly well. The quality of the bowel preparation                            was good. The ileocecal valve, appendiceal orifice,                            and rectum were photographed. Scope In: 1:41:40 PM Scope Out: 2:02:51 PM Scope Withdrawal Time: 0 hours 17 minutes 27 seconds  Total  Procedure Duration: 0 hours 21 minutes 11 seconds  Findings:      Three sessile polyps were found in the ascending colon. The polyps were       2 to 3 mm in size. These polyps were removed with a cold biopsy forceps.       Resection and retrieval were complete.      A 5 mm polyp was found in the descending colon. The polyp was sessile.       The polyp was removed with a cold snare. Resection and retrieval were       complete.      The recto-sigmoid colon, sigmoid colon and descending colon were       moderately redundant.      External and internal hemorrhoids were found. The hemorrhoids were small. Impression:               - Three 2 to 3 mm polyps in the ascending colon,                            removed with a cold biopsy forceps. Resected and                            retrieved.                           - One 5  mm polyp in the descending colon, removed                            with a cold snare. Resected and retrieved.                           - Redundant colon.                           - External and internal hemorrhoids. Moderate Sedation:      Moderate (conscious) sedation was administered by the endoscopy nurse       and supervised by the endoscopist. The following parameters were       monitored: oxygen saturation, heart rate, blood pressure, and response       to care. Total physician intraservice time was 34 minutes. Recommendation:           - Repeat colonoscopy in 3 years for surveillance.                           - High fiber diet.                           - Continue present medications.                           - Await pathology results.                           - Patient has a contact number available for                            emergencies. The signs and symptoms of potential  delayed complications were discussed with the                            patient. Return to normal activities tomorrow.                            Written discharge instructions were provided to the                            patient. Procedure Code(s):        --- Professional ---                           [phone], Colonoscopy, flexible; with removal of                            tumor(s), polyp(s), or other lesion(s) by snare                            technique                           45380, 59, Colonoscopy, flexible; with biopsy,                            single or multiple                           G0500, Moderate sedation services provided by the                            same physician or other qualified health care                            professional performing a gastrointestinal                            endoscopic service that sedation supports,                            requiring the presence of an independent trained                            observer  to assist in the monitoring of the                            patient's level of consciousness and physiological                            status; initial 15 minutes of intra-service time;                            patient age 28 years or older (additional time may                            be  reported with [phone], as appropriate)                           [phone], Moderate sedation services provided by the                            same physician or other qualified health care                            professional performing the diagnostic or                            therapeutic service that the sedation supports,                            requiring the presence of an independent trained                            observer to assist in the monitoring of the                            patient's level of consciousness and physiological                            status; each additional 15 minutes intraservice                            time (List separately in addition to code for                            primary service) Diagnosis Code(s):        --- Professional ---                           Z12.11, Encounter for screening for malignant                            neoplasm of colon                           D12.2, Benign neoplasm of ascending colon                           D12.4, Benign neoplasm of descending colon                           K64.8, Other hemorrhoids                           Q43.8, Other specified congenital malformations of                            intestine CPT copyright 2017 [employer]. All rights reserved. The codes documented in this report are preliminary and upon coder review may  be revised to meet current compliance requirements. Barney Drain, MD Barney Drain MD, MD  01/28/2018 2:13:57 PM This report has been signed electronically. Number of Addenda: 0

## 2018-01-28 NOTE — H&P (Signed)
Primary Care Physician:  Timmothy Euler, MD Primary Gastroenterologist:  Dr. Oneida Alar  Pre-Procedure History & Physical: HPI:  Lauren Shannon is a 62 y.o. female here for [place].  Past Medical History:  Diagnosis Date  . Allergy   . Arthritis    ostoarthritis  . Basal cell carcinoma   . Essential hypertension    pt states not currently being treated for this  . Headache   . History of pneumonia 1963  . Low back pain   . Mixed hyperlipidemia   . Type 2 diabetes mellitus ([place])    pt stated not currently being treated for this    Past Surgical History:  Procedure Laterality Date  . APPENDECTOMY    . CHOLECYSTECTOMY    . PLANTAR FASCIA SURGERY Right 2012  . stimulation system implant  2015  . THYROID SURGERY  2005  . TONSILLECTOMY    . TUBAL LIGATION  1993  . Uterine polyp removal      Prior to Admission medications   Medication Sig Start Date End Date Taking? Authorizing Provider  acetaminophen (TYLENOL) 500 MG tablet Take 1,000 mg by mouth daily as needed for moderate pain.   Yes [provider]  aspirin 81 MG tablet Take 81 mg by mouth every evening.    Yes [provider]  Calcium Citrate-Vitamin D (CALCIUM + D PO) Take 1 tablet by mouth daily.   Yes [provider]  Carboxymethylcellulose Sodium (THERATEARS OP) Place 1 drop into both eyes daily.   Yes [provider]  fluticasone (FLONASE) 50 MCG/ACT nasal spray Place 2 sprays into both nostrils daily. Patient taking differently: Place 2 sprays into both nostrils daily as needed for allergies.  03/12/17  Yes Hawks, Christy A, FNP  ibuprofen (ADVIL,MOTRIN) 200 MG tablet Take 400 mg by mouth daily as needed for headache or moderate pain.   Yes [provider]  levocetirizine (XYZAL) 5 MG tablet Take 5 mg by mouth every evening.   Yes [provider]  LINZESS 145 MCG CAPS capsule Take 1 capsule (145 mcg total) by mouth daily before breakfast. 11/11/17  Yes  Timmothy Euler, MD  mirabegron ER (MYRBETRIQ) 50 MG TB24 tablet Take 50 mg by mouth daily.   Yes [provider]  Multiple Vitamin (MULTIVITAMIN) capsule Take 1 capsule by mouth daily.   Yes [provider]  Na Sulfate-K Sulfate-Mg Sulf (SUPREP BOWEL PREP KIT) 17.5-3.13-1.6 GM/177ML SOLN Take 1 kit by mouth as directed. 12/21/17  Yes Annitta Needs, NP  risedronate (ACTONEL) 150 MG tablet Take 1 tablet (150 mg total) by mouth every 30 (thirty) days. with water on empty stomach, nothing by mouth or lie down for next 30 minutes. 11/12/17  Yes Memory Argue, PharmD  traZODone (DESYREL) 100 MG tablet TAKE 1/2 TO 1 TABLET AT BEDTIME Patient taking differently: Take 100 mg by mouth once daily at bedtime 09/10/17  Yes Timmothy Euler, MD  lovastatin (MEVACOR) 40 MG tablet TAKE 1 TABLET ONCE DAILY AT 6PM Patient not taking: Reported on 01/21/2018 06/30/17   Timmothy Euler, MD    Allergies as of 12/22/2017  . (No Known Allergies)    Family History  Problem Relation Age of Onset  . Diabetes Mother   . Hypertension Mother   . Heart disease Father   . Heart disease Brother   . Colon cancer Neg Hx   . Colon polyps Neg Hx     Social History   Socioeconomic History  .  Marital status: Married    Spouse name: Not on file  . Number of children: Not on file  . Years of education: Not on file  . Highest education level: Not on file  Occupational History  . Not on file  Social Needs  . Financial resource strain: Not on file  . Food insecurity:    Worry: Not on file    Inability: Not on file  . Transportation needs:    Medical: Not on file    Non-medical: Not on file  Tobacco Use  . Smoking status: Never Smoker  . Smokeless tobacco: Never Used  Substance and Sexual Activity  . Alcohol use: Yes    Alcohol/week: 0.0 oz    Comment: 6 per year  . Drug use: No  . Sexual activity: Not Currently    Partners: Male  Lifestyle  . Physical activity:    Days per  week: Not on file    Minutes per session: Not on file  . Stress: Not on file  Relationships  . Social connections:    Talks on phone: Not on file    Gets together: Not on file    Attends religious service: Not on file    Active member of club or organization: Not on file    Attends meetings of clubs or organizations: Not on file    Relationship status: Not on file  . Intimate partner violence:    Fear of current or ex partner: Not on file    Emotionally abused: Not on file    Physically abused: Not on file    Forced sexual activity: Not on file  Other Topics Concern  . Not on file  Social History Narrative  . Not on file    Review of Systems: See HPI, otherwise negative ROS   Physical Exam: BP 123/79   Pulse 92   Temp 98.2 F (36.8 C) (Oral)   Resp 13   Ht '5\' 5"'  (1.651 m)   Wt 191 lb (86.6 kg)   SpO2 97%   BMI 31.78 kg/m  General:   Alert,  pleasant and cooperative in NAD Head:  Normocephalic and atraumatic. Neck:  Supple; Lungs:  Clear throughout to auscultation.    Heart:  Regular rate and rhythm. Abdomen:  Soft, nontender and nondistended. Normal bowel sounds, without guarding, and without rebound.   Neurologic:  Alert and  oriented x4;  grossly normal neurologically.  Impression/Plan:  ]  SCREENING  Plan:  1. TCS TODAY DISCUSSED PROCEDURE, BENEFITS, & RISKS: < 1% chance of medication reaction, bleeding, perforation, or rupture of spleen/liver.

## 2018-01-31 ENCOUNTER — Encounter: Payer: Self-pay | Admitting: Physician Assistant

## 2018-01-31 ENCOUNTER — Ambulatory Visit: Payer: BLUE CROSS/BLUE SHIELD | Admitting: Physician Assistant

## 2018-01-31 VITALS — BP 106/72 | HR 71 | Temp 97.0°F | Ht 65.0 in | Wt 197.0 lb

## 2018-01-31 DIAGNOSIS — R197 Diarrhea, unspecified: Secondary | ICD-10-CM | POA: Diagnosis not present

## 2018-01-31 DIAGNOSIS — R300 Dysuria: Secondary | ICD-10-CM | POA: Diagnosis not present

## 2018-01-31 LAB — URINALYSIS, COMPLETE
Bilirubin, UA: NEGATIVE
Glucose, UA: NEGATIVE
Ketones, UA: NEGATIVE
Nitrite, UA: NEGATIVE
Protein, UA: NEGATIVE
Specific Gravity, UA: 1.015 (ref 1.005–1.030)
Urobilinogen, Ur: 0.2 mg/dL (ref 0.2–1.0)
pH, UA: 5.5 (ref 5.0–7.5)

## 2018-01-31 LAB — MICROSCOPIC EXAMINATION
Renal Epithel, UA: NONE SEEN /hpf
WBC, UA: >30 /hpf — AB (ref 0–5)

## 2018-01-31 MED ORDER — NITROFURANTOIN MONOHYD MACRO 100 MG PO CAPS: 100.0000 mg | ORAL_CAPSULE | Freq: Two times a day (BID) | ORAL | 0 refills | Status: DC

## 2018-01-31 NOTE — Patient Instructions (Signed)
Dysuria Dysuria is pain or discomfort while urinating. The pain or discomfort may be felt in the tube that carries urine out of the bladder (urethra) or in the surrounding tissue of the genitals. The pain may also be felt in the groin area, lower abdomen, and lower back. You may have to urinate frequently or have the sudden feeling that you have to urinate (urgency). Dysuria can affect both men and women, but is more common in women. Dysuria can be caused by many different things, including:  Urinary tract infection in women.  Infection of the kidney or bladder.  Kidney stones or bladder stones.  Certain sexually transmitted infections (STIs), such as chlamydia.  Dehydration.  Inflammation of the vagina.  Use of certain medicines.  Use of certain soaps or scented products that cause irritation.  Follow these instructions at home: Watch your dysuria for any changes. The following actions may help to reduce any discomfort you are feeling:  Drink enough fluid to keep your urine clear or pale yellow.  Empty your bladder often. Avoid holding urine for long periods of time.  After a bowel movement or urination, women should cleanse from front to back, using each tissue only once.  Empty your bladder after sexual intercourse.  Take medicines only as directed by your health care provider.  If you were prescribed an antibiotic medicine, finish it all even if you start to feel better.  Avoid caffeine, tea, and alcohol. They can irritate the bladder and make dysuria worse. In men, alcohol may irritate the prostate.  Keep all follow-up visits as directed by your health care provider. This is important.  If you had any tests done to find the cause of dysuria, it is your responsibility to obtain your test results. Ask the lab or department performing the test when and how you will get your results. Talk with your health care provider if you have any questions about your results.  Contact a  health care provider if:  You develop pain in your back or sides.  You have a fever.  You have nausea or vomiting.  You have blood in your urine.  You are not urinating as often as you usually do. Get help right away if:  You pain is severe and not relieved with medicines.  You are unable to hold down any fluids.  You or someone else notices a change in your mental function.  You have a rapid heartbeat at rest.  You have shaking or chills.  You feel extremely weak. This information is not intended to replace advice given to you by your health care provider. Make sure you discuss any questions you have with your health care provider. Document Released: 05/15/2004 Document Revised: 01/23/2016 Document Reviewed: 04/12/2014 Elsevier Interactive Patient Education  2018 [employer]. Diarrhea, Adult Diarrhea is when you have loose and water poop (stool) often. Diarrhea can make you feel weak and cause you to get dehydrated. Dehydration can make you tired and thirsty, make you have a dry mouth, and make it so you pee (urinate) less often. Diarrhea often lasts 2-3 days. However, it can last longer if it is a sign of something more serious. It is important to treat your diarrhea as told by your doctor. Follow these instructions at home: Eating and drinking  Follow these recommendations as told by your doctor:  Take an oral rehydration solution (ORS). This is a drink that is sold at pharmacies and stores.  Drink clear fluids, such as: ? Water. ?  Ice chips. ? Diluted fruit juice. ? Low-calorie sports drinks.  Eat bland, easy-to-digest foods in small amounts as you are able. These foods include: ? Bananas. ? Applesauce. ? Rice. ? Low-fat (lean) meats. ? Toast. ? Crackers.  Avoid drinking fluids that have a lot of sugar or caffeine in them.  Avoid alcohol.  Avoid spicy or fatty foods.  General instructions   Drink enough fluid to keep your pee (urine) clear or pale  yellow.  Wash your hands often. If you cannot use soap and water, use hand sanitizer.  Make sure that all people in your home wash their hands well and often.  Take over-the-counter and prescription medicines only as told by your doctor.  Rest at home while you get better.  Watch your condition for any changes.  Take a warm bath to help with any burning or pain from having diarrhea.  Keep all follow-up visits as told by your doctor. This is important. Contact a doctor if:  You have a fever.  Your diarrhea gets worse.  You have new symptoms.  You cannot keep fluids down.  You feel light-headed or dizzy.  You have a headache.  You have muscle cramps. Get help right away if:  You have chest pain.  You feel very weak or you pass out (faint).  You have bloody or black poop or poop that look like tar.  You have very bad pain, cramping, or bloating in your belly (abdomen).  You have trouble breathing or you are breathing very quickly.  Your heart is beating very quickly.  Your skin feels cold and clammy.  You feel confused.  You have signs of dehydration, such as: ? Dark pee, hardly any pee, or no pee. ? Cracked lips. ? Dry mouth. ? Sunken eyes. ? Sleepiness. ? Weakness. This information is not intended to replace advice given to you by your health care provider. Make sure you discuss any questions you have with your health care provider. Document Released: 02/03/2008 Document Revised: 03/06/2016 Document Reviewed: 04/23/2015 Elsevier Interactive Patient Education  2018 [employer].

## 2018-01-31 NOTE — Progress Notes (Signed)
  Subjective:     Patient ID: Lauren Shannon, female   DOB: 01-Oct-1955, 62 y.o.   MRN: [id number]  HPI Pt with 2 main complaints #1- pain just at the end of urination for 1-2 days #2- diarrhea since having colonoscopy on Fri  Review of Systems  Constitutional: Negative.   Gastrointestinal: Positive for diarrhea. Negative for abdominal distention, abdominal pain, nausea and vomiting.  Genitourinary: Positive for dysuria. Negative for decreased urine volume, difficulty urinating, enuresis, flank pain, hematuria and pelvic pain.       Objective:   Physical Exam  Constitutional: She appears well-developed and well-nourished. No distress.  HENT:  Mouth/Throat: Oropharynx is clear and moist. No oropharyngeal exudate.  Neck: Neck supple.  Abdominal: Soft. Bowel sounds are normal. She exhibits no distension and no mass. There is no tenderness. There is no rebound and no guarding.  No CVAT  Lymphadenopathy:    She has no cervical adenopathy.  Skin: She is not diaphoretic.  Nursing note and vitals reviewed. Urine see labs     Assessment:     ** 1. Dysuria   2. Diarrhea, unspecified type        Plan:     Fluids - caff/dairy free [place] diet OTC probiotics OTC Imodium prn Due to urine results Macrobid bid x 3 days F/U prn

## 2018-02-02 ENCOUNTER — Encounter (HOSPITAL_COMMUNITY): Payer: Self-pay | Admitting: Gastroenterology

## 2018-02-05 ENCOUNTER — Other Ambulatory Visit: Payer: Self-pay | Admitting: Family Medicine

## 2018-02-05 DIAGNOSIS — K5900 Constipation, unspecified: Secondary | ICD-10-CM

## 2018-02-09 NOTE — Progress Notes (Signed)
PATIENT SCHEDULED AND ON RECALL  °

## 2018-02-09 NOTE — Progress Notes (Signed)
LMOM to call.

## 2018-02-09 NOTE — Progress Notes (Signed)
PT is aware.

## 2018-02-10 ENCOUNTER — Ambulatory Visit (HOSPITAL_COMMUNITY)
Admission: RE | Admit: 2018-02-10 | Discharge: 2018-02-10 | Disposition: A | Discharge status: Home / Self Care | Payer: BLUE CROSS/BLUE SHIELD | Source: Ambulatory Visit | Attending: Obstetrics & Gynecology | Admitting: Obstetrics & Gynecology

## 2018-02-10 DIAGNOSIS — Z1231 Encounter for screening mammogram for malignant neoplasm of breast: Secondary | ICD-10-CM | POA: Insufficient documentation

## 2018-02-24 ENCOUNTER — Encounter: Payer: Self-pay | Admitting: Pediatrics

## 2018-03-05 ENCOUNTER — Other Ambulatory Visit: Payer: Self-pay | Admitting: Family Medicine

## 2018-03-24 DIAGNOSIS — Z08 Encounter for follow-up examination after completed treatment for malignant neoplasm: Secondary | ICD-10-CM | POA: Diagnosis not present

## 2018-03-24 DIAGNOSIS — Z85828 Personal history of other malignant neoplasm of skin: Secondary | ICD-10-CM | POA: Diagnosis not present

## 2018-03-24 DIAGNOSIS — L820 Inflamed seborrheic keratosis: Secondary | ICD-10-CM | POA: Diagnosis not present

## 2018-03-24 DIAGNOSIS — Z1283 Encounter for screening for malignant neoplasm of skin: Secondary | ICD-10-CM | POA: Diagnosis not present

## 2018-05-04 ENCOUNTER — Other Ambulatory Visit: Payer: Self-pay | Admitting: Pediatrics

## 2018-05-04 ENCOUNTER — Ambulatory Visit (INDEPENDENT_AMBULATORY_CARE_PROVIDER_SITE_OTHER): Payer: BLUE CROSS/BLUE SHIELD

## 2018-05-04 ENCOUNTER — Encounter: Payer: Self-pay | Admitting: Pediatrics

## 2018-05-04 ENCOUNTER — Ambulatory Visit: Payer: BLUE CROSS/BLUE SHIELD | Admitting: Pediatrics

## 2018-05-04 VITALS — BP 111/72 | HR 78 | Temp 97.8°F | Ht 65.0 in | Wt 201.2 lb

## 2018-05-04 DIAGNOSIS — R52 Pain, unspecified: Secondary | ICD-10-CM

## 2018-05-04 DIAGNOSIS — S92511A Displaced fracture of proximal phalanx of right lesser toe(s), initial encounter for closed fracture: Secondary | ICD-10-CM | POA: Diagnosis not present

## 2018-05-04 DIAGNOSIS — M79671 Pain in right foot: Secondary | ICD-10-CM | POA: Diagnosis not present

## 2018-05-04 NOTE — Progress Notes (Signed)
  Subjective:   Patient ID: Lauren Shannon, female    DOB: [birth year], 62 y.o.   MRN: [id number] CC: Broke bone (4th right toe)  HPI: Lauren Shannon is a 62 y.o. female   A couple nights ago stubbed toe in the night. Has had pain since then, swellin gin the foot, not able to wear regular shoes. No other injuries.   Relevant past medical, surgical, family and social history reviewed. Allergies and medications reviewed and updated. Social History   Tobacco Use  Smoking Status Never Smoker  Smokeless Tobacco Never Used   ROS: Per HPI   Objective:    BP 111/72   Pulse 78   Temp 97.8 F (36.6 C) (Oral)   Ht 5\' 5"  (1.651 m)   Wt 201 lb 3.2 oz (91.3 kg)   BMI 33.48 kg/m   Wt Readings from Last 3 Encounters:  05/04/18 201 lb 3.2 oz (91.3 kg)  01/31/18 197 lb (89.4 kg)  01/28/18 191 lb (86.6 kg)    Gen: NAD, alert, cooperative with exam, NCAT EYES: EOMI, no conjunctival injection, or no icterus CV: NRRR, normal S1/S2, no murmur, distal pulses 2+ b/l Resp: CTABL, no wheezes, normal WOB Neuro: Alert and oriented MSK: R foot swollen compared to L, especially R 5th, 4th toes. Minimal bruising over dorsum of foot present. No ttp along metatarsal, R 4th toe ttp  Assessment & Plan:  Lauren Shannon was seen today for broke bone.  Diagnoses and all orders for this visit:  Closed displaced fracture of proximal phalanx of lesser toe of right foot, initial encounter Minimally displaced, gave post-op shoe, buddy tape toe, return precautions discussed. Has upcoming f/u appt.  Follow up plan: Return if symptoms worsen or fail to improve. Assunta Found, MD [place]

## 2018-05-06 ENCOUNTER — Ambulatory Visit: Payer: BLUE CROSS/BLUE SHIELD | Admitting: Pediatrics

## 2018-05-06 ENCOUNTER — Encounter: Payer: Self-pay | Admitting: Pediatrics

## 2018-05-06 VITALS — BP 113/78 | HR 70 | Temp 97.5°F | Ht 65.0 in | Wt 200.4 lb

## 2018-05-06 DIAGNOSIS — M810 Age-related osteoporosis without current pathological fracture: Secondary | ICD-10-CM

## 2018-05-06 DIAGNOSIS — I10 Essential (primary) hypertension: Secondary | ICD-10-CM | POA: Diagnosis not present

## 2018-05-06 DIAGNOSIS — Z23 Encounter for immunization: Secondary | ICD-10-CM

## 2018-05-06 DIAGNOSIS — E119 Type 2 diabetes mellitus without complications: Secondary | ICD-10-CM

## 2018-05-06 DIAGNOSIS — K5900 Constipation, unspecified: Secondary | ICD-10-CM

## 2018-05-06 DIAGNOSIS — E782 Mixed hyperlipidemia: Secondary | ICD-10-CM

## 2018-05-06 DIAGNOSIS — R5383 Other fatigue: Secondary | ICD-10-CM

## 2018-05-06 DIAGNOSIS — N3941 Urge incontinence: Secondary | ICD-10-CM

## 2018-05-06 DIAGNOSIS — G4700 Insomnia, unspecified: Secondary | ICD-10-CM

## 2018-05-06 LAB — CBC WITH DIFFERENTIAL/PLATELET
Basophils Absolute: 0 10*3/uL (ref 0.0–0.2)
Basos: 1 %
EOS (ABSOLUTE): 0.1 10*3/uL (ref 0.0–0.4)
Eos: 3 %
Hematocrit: 42.1 % (ref 34.0–46.6)
Hemoglobin: 14.1 g/dL (ref 11.1–15.9)
Immature Grans (Abs): 0 10*3/uL (ref 0.0–0.1)
Immature Granulocytes: 0 %
Lymphocytes Absolute: 1.4 10*3/uL (ref 0.7–3.1)
Lymphs: 29 %
MCH: 30.5 pg (ref 26.6–33.0)
MCHC: 33.5 g/dL (ref 31.5–35.7)
MCV: 91 fL (ref 79–97)
Monocytes Absolute: 0.5 10*3/uL (ref 0.1–0.9)
Monocytes: 11 %
Neutrophils Absolute: 2.7 10*3/uL (ref 1.4–7.0)
Neutrophils: 56 %
Platelets: 236 10*3/uL (ref 150–450)
RBC: 4.63 x10E6/uL (ref 3.77–5.28)
RDW: 11.9 % — ABNORMAL LOW (ref 12.3–15.4)
WBC: 4.8 10*3/uL (ref 3.4–10.8)

## 2018-05-06 LAB — BAYER DCA HB A1C WAIVED: HB A1C (BAYER DCA - WAIVED): 5.6 % (ref <7.0)

## 2018-05-06 MED ORDER — LINACLOTIDE 145 MCG PO CAPS: ORAL_CAPSULE | ORAL | 2 refills | Status: DC

## 2018-05-06 MED ORDER — MIRABEGRON ER 50 MG PO TB24: 50.0000 mg | ORAL_TABLET | Freq: Every day | ORAL | 1 refills | Status: DC

## 2018-05-06 MED ORDER — TRAZODONE HCL 100 MG PO TABS: ORAL_TABLET | ORAL | 1 refills | Status: DC

## 2018-05-06 MED ORDER — RISEDRONATE SODIUM 150 MG PO TABS: 150.0000 mg | ORAL_TABLET | ORAL | 6 refills | Status: DC

## 2018-05-06 NOTE — Progress Notes (Signed)
Subjective:   Patient ID: Lauren Shannon, female    DOB: 1956/03/25, 62 y.o.   MRN: [id number] CC: Medical Management of Chronic Issues and Weight Gain  HPI: Lauren Shannon is a 62 y.o. female   Recent broken toe: Seen 2 days ago.  Put in a postop shoe.  Pain has been improved.  Only taken ibuprofen once.  Swelling improved.  Urge incontinence: Taking Myrbetriq good effect.  No bad side effects.  Osteoporosis: Has been on risedronate once a month starting in March 2019.  Fatigue: Wakes up 345a due to husband's schedule, usually in bed by 8p.  Sometimes able to fall asleep within 5 to 10 minutes, sometimes tosses and turns were well.  Takes trazodone nightly.  Does not nap.  Drinks 2 cups of coffee early in the morning.  Rarely snores.  Diabetes: Diet controlled.  Constipation: Well-controlled as long as she takes Linzess daily.  Relevant past medical, surgical, family and social history reviewed. Allergies and medications reviewed and updated. Social History   Tobacco Use  Smoking Status Never Smoker  Smokeless Tobacco Never Used   ROS: Per HPI   Objective:    BP 113/78   Pulse 70   Temp (!) 97.5 F (36.4 C) (Oral)   Ht '5\' 5"'  (1.651 m)   Wt 200 lb 6.4 oz (90.9 kg)   BMI 33.35 kg/m   Wt Readings from Last 3 Encounters:  05/06/18 200 lb 6.4 oz (90.9 kg)  05/04/18 201 lb 3.2 oz (91.3 kg)  01/31/18 197 lb (89.4 kg)    Gen: NAD, alert, cooperative with exam, NCAT EYES: EOMI, no conjunctival injection, or no icterus CV: NRRR, normal S1/S2, no murmur, distal pulses 2+ b/l Resp: CTABL, no wheezes, normal WOB Abd: +BS, soft, NTND. no guarding or organomegaly Ext: No edema, warm Neuro: Alert and oriented, strength equal b/l UE and LE, coordination grossly normal  Assessment & Plan:  Lauren Shannon was seen today for medical management of chronic issues and weight gain.  Diagnoses and all orders for this visit:  Essential hypertension Well-controlled, now off of medicines. -      BMP8+EGFR  Mixed hyperlipidemia Stable, now off of medicines, has made significant dietary changes. -     Lipid panel  Type 2 diabetes mellitus without complication, without long-term current use of insulin (HCC) Excellent A1c 5.6, diet controlled. -     Bayer DCA Hb A1c Waived  Constipation, unspecified constipation type Stable, continue below -     linaclotide (LINZESS) 145 MCG CAPS capsule; TAKE (1) CAPSULE DAILY BEFORE BREAKFAST.  Other fatigue No obvious sleep apnea symptoms, will check below.  Start 20 to 30 minutes daily walking once toe heals. -     TSH -     CBC with Differential  Insomnia, unspecified type Can try melatonin before dinnertime to help with early bedtimes. -     traZODone (DESYREL) 100 MG tablet; TAKE 1/2 TO 1 TABLET AT BEDTIME  Urge incontinence Stable, continue below -     mirabegron ER (MYRBETRIQ) 50 MG TB24 tablet; Take 1 tablet (50 mg total) by mouth daily.  Osteoporosis, unspecified osteoporosis type, unspecified pathological fracture presence -     risedronate (ACTONEL) 150 MG tablet; Take 1 tablet (150 mg total) by mouth every 30 (thirty) days. with water on empty stomach, nothing by mouth or lie down for next 30 minutes.  Other orders -     Pneumococcal polysaccharide vaccine 23-valent greater than or equal to 2yo subcutaneous/IM  Follow up plan: Return in about 6 months (around 11/04/2018). Assunta Found, MD [place]

## 2018-05-07 ENCOUNTER — Other Ambulatory Visit: Payer: Self-pay | Admitting: Pediatrics

## 2018-05-07 DIAGNOSIS — E039 Hypothyroidism, unspecified: Secondary | ICD-10-CM

## 2018-05-07 LAB — BMP8+EGFR
BUN/Creatinine Ratio: 21 (ref 12–28)
BUN: 16 mg/dL (ref 8–27)
CO2: 27 mmol/L (ref 20–29)
Calcium: 9.4 mg/dL (ref 8.7–10.3)
Chloride: 101 mmol/L (ref 96–106)
Creatinine, Ser: 0.77 mg/dL (ref 0.57–1.00)
GFR calc Af Amer: 96 mL/min/{1.73_m2} (ref >59)
GFR calc non Af Amer: 84 mL/min/{1.73_m2} (ref >59)
Glucose: 91 mg/dL (ref 65–99)
Potassium: 4.6 mmol/L (ref 3.5–5.2)
Sodium: 143 mmol/L (ref 134–144)

## 2018-05-07 LAB — LIPID PANEL
Chol/HDL Ratio: 3.1 ratio (ref 0.0–4.4)
Cholesterol, Total: 212 mg/dL — ABNORMAL HIGH (ref 100–199)
HDL: 69 mg/dL (ref >39)
LDL Calculated: 124 mg/dL — ABNORMAL HIGH (ref 0–99)
Triglycerides: 93 mg/dL (ref 0–149)
VLDL Cholesterol Cal: 19 mg/dL (ref 5–40)

## 2018-05-07 LAB — TSH: TSH: 5.48 u[IU]/mL — ABNORMAL HIGH (ref 0.450–4.500)

## 2018-05-10 ENCOUNTER — Other Ambulatory Visit: Payer: BLUE CROSS/BLUE SHIELD

## 2018-05-10 DIAGNOSIS — E039 Hypothyroidism, unspecified: Secondary | ICD-10-CM

## 2018-05-11 LAB — TSH: TSH: 5.25 u[IU]/mL — ABNORMAL HIGH (ref 0.450–4.500)

## 2018-05-11 LAB — T4, FREE: Free T4: 0.92 ng/dL (ref 0.82–1.77)

## 2018-05-16 ENCOUNTER — Encounter: Payer: Self-pay | Admitting: Pediatrics

## 2018-05-17 ENCOUNTER — Other Ambulatory Visit: Payer: Self-pay | Admitting: Pediatrics

## 2018-05-17 MED ORDER — LEVOTHYROXINE SODIUM 50 MCG PO TABS: 50.0000 ug | ORAL_TABLET | Freq: Every day | ORAL | 0 refills | Status: DC

## 2018-05-25 ENCOUNTER — Ambulatory Visit (INDEPENDENT_AMBULATORY_CARE_PROVIDER_SITE_OTHER): Payer: BLUE CROSS/BLUE SHIELD

## 2018-05-25 DIAGNOSIS — Z23 Encounter for immunization: Secondary | ICD-10-CM | POA: Diagnosis not present

## 2018-06-03 ENCOUNTER — Other Ambulatory Visit: Payer: Self-pay | Admitting: Pediatrics

## 2018-06-05 ENCOUNTER — Encounter: Payer: Self-pay | Admitting: Pediatrics

## 2018-06-21 ENCOUNTER — Encounter: Payer: Self-pay | Admitting: Pediatrics

## 2018-07-20 ENCOUNTER — Encounter: Payer: Self-pay | Admitting: Pediatrics

## 2018-07-20 ENCOUNTER — Ambulatory Visit: Payer: BLUE CROSS/BLUE SHIELD | Admitting: Pediatrics

## 2018-07-20 VITALS — BP 124/78 | HR 69 | Temp 98.0°F | Ht 65.0 in | Wt 198.0 lb

## 2018-07-20 DIAGNOSIS — E039 Hypothyroidism, unspecified: Secondary | ICD-10-CM | POA: Diagnosis not present

## 2018-07-20 DIAGNOSIS — R5383 Other fatigue: Secondary | ICD-10-CM | POA: Diagnosis not present

## 2018-07-20 NOTE — Progress Notes (Signed)
  Subjective:   Patient ID: Lauren Shannon, female    DOB: [birth year], 62 y.o.   MRN: [id number] CC: Hypothyroidism (Recheck)  HPI: Lauren Shannon is a 61 y.o. female   Hypothyroidism: Taking medicine regularly.  Recently started.  Continues to feel fatigued.  Daytime fatigue: Wakes up at 345 because of her husband's job.  Minimizing caffeine.  Stopped taking trazodone for about the last 3 weeks.  Has not made a difference in fatigue.  Feels very sleepy around 7 PM.  Tries to keep herself awake until 830-9p.  Recent episode of right knee pain.  Took about 3 and half weeks to resolve.  Pain present at all times, with a standing or sitting.  No injury that she knows of.  Knee slightly swollen when pain started.  No swelling in her lower legs.  Relevant past medical, surgical, family and social history reviewed. Allergies and medications reviewed and updated. Social History   Tobacco Use  Smoking Status Never Smoker  Smokeless Tobacco Never Used   ROS: Per HPI   Objective:    BP 124/78   Pulse 69   Temp 98 F (36.7 C) (Oral)   Ht 5\' 5"  (1.651 m)   Wt 198 lb (89.8 kg)   BMI 32.95 kg/m   Wt Readings from Last 3 Encounters:  07/20/18 198 lb (89.8 kg)  05/06/18 200 lb 6.4 oz (90.9 kg)  05/04/18 201 lb 3.2 oz (91.3 kg)    Gen: NAD, alert, cooperative with exam, NCAT EYES: EOMI, no conjunctival injection, or no icterus CV: distal pulses 2+ b/l Resp:  normal WOB Ext: No edema, warm Neuro: Alert and oriented, strength equal b/l UE and LE, coordination grossly normal MSK: normal muscle bulk  Assessment & Plan:  Lauren Shannon was seen today for hypothyroidism.  Diagnoses and all orders for this visit:  Hypothyroidism, unspecified type Continue levothyroxine.  Due for repeat. -     TSH  Other fatigue Continue avoiding daytime naps.  Try to allow for 8-9 hours of sleep at night. -     Ambulatory referral to Neurology   Follow up plan: Return in about 6 months (around  01/18/2019). Assunta Found, MD [place]

## 2018-07-21 LAB — TSH: TSH: 2.4 u[IU]/mL (ref 0.450–4.500)

## 2018-08-05 ENCOUNTER — Other Ambulatory Visit: Payer: Self-pay | Admitting: Pediatrics

## 2018-08-19 ENCOUNTER — Encounter: Payer: Self-pay | Admitting: Pediatrics

## 2018-08-19 DIAGNOSIS — N3941 Urge incontinence: Secondary | ICD-10-CM | POA: Diagnosis not present

## 2018-09-19 ENCOUNTER — Encounter: Payer: Self-pay | Admitting: Physician Assistant

## 2018-09-19 ENCOUNTER — Encounter: Payer: Self-pay | Admitting: Pediatrics

## 2018-09-19 ENCOUNTER — Ambulatory Visit: Payer: BLUE CROSS/BLUE SHIELD | Admitting: Physician Assistant

## 2018-09-19 ENCOUNTER — Ambulatory Visit (INDEPENDENT_AMBULATORY_CARE_PROVIDER_SITE_OTHER): Payer: BLUE CROSS/BLUE SHIELD

## 2018-09-19 VITALS — BP 119/77 | HR 69 | Temp 96.9°F | Ht 65.0 in | Wt 205.2 lb

## 2018-09-19 DIAGNOSIS — M546 Pain in thoracic spine: Secondary | ICD-10-CM | POA: Diagnosis not present

## 2018-09-19 DIAGNOSIS — M816 Localized osteoporosis [Lequesne]: Secondary | ICD-10-CM

## 2018-09-19 DIAGNOSIS — G8929 Other chronic pain: Secondary | ICD-10-CM

## 2018-09-19 DIAGNOSIS — M5136 Other intervertebral disc degeneration, lumbar region: Secondary | ICD-10-CM | POA: Diagnosis not present

## 2018-09-19 DIAGNOSIS — M4185 Other forms of scoliosis, thoracolumbar region: Secondary | ICD-10-CM | POA: Diagnosis not present

## 2018-09-19 MED ORDER — HYDROCODONE-ACETAMINOPHEN 5-325 MG PO TABS: 1.0000 | ORAL_TABLET | Freq: Four times a day (QID) | ORAL | 0 refills | Status: DC | PRN

## 2018-09-19 MED ORDER — PREDNISONE 10 MG (21) PO TBPK: ORAL_TABLET | ORAL | 0 refills | Status: DC

## 2018-09-19 NOTE — Patient Instructions (Signed)
Balloon Kyphoplasty    Balloon kyphoplasty is a procedure to treat a spinal compression fracture, which is a collapse of the bones that form the spine (vertebrae). With this type of fracture, the vertebrae become squashed (compressed) into a wedge shape, and this causes pain. In this procedure, the collapsed vertebrae are expanded with a balloon, and bone cement is injected into them to strengthen them.  Tell a health care provider about:  · Any allergies you have.  · All medicines you are taking, including vitamins, herbs, eye drops, creams, and over-the-counter medicines.  · Any problems you or family members have had with anesthetic medicines.  · Any blood disorders you have.  · Any surgeries you have had.  · Any medical conditions you have.  · Whether you are pregnant or may be pregnant.  What are the risks?  Generally, this is a safe procedure. However, problems may occur, including:  · Infection.  · Bleeding.  · Allergic reactions to medicines.  · Damage to other structures or organs.  · Leaking of bone cement into other parts of the body.  What happens before the procedure?  · Follow instructions from your health care provider about eating or drinking restrictions.  · Ask your health care provider about:  ? Changing or stopping your regular medicines. This is especially important if you are taking diabetes medicines or blood thinners.  ? Taking medicines such as aspirin and ibuprofen. These medicines can thin your blood. Do not take these medicines before your procedure if your health care provider instructs you not to.  · Ask your health care provider how your surgical site will be marked or identified.  · You may be given antibiotic medicine to help prevent infection.  · Do not use tobacco products, including cigarettes, chewing tobacco, or e-cigarettes. If you need help quitting, ask your health care provider.  · Plan to have someone take you home after the procedure.  · If you go home right after the  procedure, plan to have someone with you for 24 hours.  What happens during the procedure?  · To reduce your risk of infection:  ? Your health care team will wash or sanitize their hands.  ? Your skin will be washed with soap.  · An IV tube will be inserted into one of your veins.  · You will be given one or more of the following:  ? A medicine to help you relax (sedative).  ? A medicine to numb the area (local anesthetic).  ? A medicine to make you fall asleep (general anesthetic).  · Your surgeon will use an X-ray machine to see your spinal compression fracture.  · Two small incisions will be made near your spine.  · A thin tube will be inserted into your spine. Through this tube, the balloon will be placed in your spine where the fractures are.  · The balloon will be inflated. This will create space and push the bone back toward its normal height and shape.  · The balloon will be removed.  · The newly created space in your spine will be filled with bone cement.  · When the cement hardens, the tube in your spine will be removed.  · Your incisions will be closed with stitches (sutures), skin glue, or adhesive strips.  · A bandage (dressing) may be used to cover your incisions.  The procedure may vary among health care providers and hospitals.  What happens after the procedure?  · Your   blood pressure, heart rate, breathing rate, and blood oxygen level will be monitored often until the medicines you were given have worn off.  · You will have some pain. Pain medicine will be available to help you.  This information is not intended to replace advice given to you by your health care provider. Make sure you discuss any questions you have with your health care provider.  Document Released: 07/23/2004 Document Revised: 01/23/2016 Document Reviewed: 12/10/2014  Elsevier Interactive Patient Education © 2019 [employer]

## 2018-09-20 ENCOUNTER — Encounter: Payer: Self-pay | Admitting: Physician Assistant

## 2018-09-21 ENCOUNTER — Other Ambulatory Visit: Payer: Self-pay | Admitting: Physician Assistant

## 2018-09-21 ENCOUNTER — Encounter: Payer: Self-pay | Admitting: Physician Assistant

## 2018-09-21 ENCOUNTER — Other Ambulatory Visit: Payer: Self-pay | Admitting: Pediatrics

## 2018-09-21 ENCOUNTER — Encounter: Payer: Self-pay | Admitting: Neurology

## 2018-09-21 ENCOUNTER — Ambulatory Visit: Payer: BLUE CROSS/BLUE SHIELD | Admitting: Neurology

## 2018-09-21 VITALS — BP 122/84 | HR 91 | Ht 63.0 in | Wt 203.0 lb

## 2018-09-21 DIAGNOSIS — G8929 Other chronic pain: Secondary | ICD-10-CM

## 2018-09-21 DIAGNOSIS — M546 Pain in thoracic spine: Principal | ICD-10-CM

## 2018-09-21 DIAGNOSIS — I493 Ventricular premature depolarization: Secondary | ICD-10-CM

## 2018-09-21 DIAGNOSIS — I10 Essential (primary) hypertension: Secondary | ICD-10-CM | POA: Diagnosis not present

## 2018-09-21 DIAGNOSIS — M5136 Other intervertebral disc degeneration, lumbar region: Secondary | ICD-10-CM

## 2018-09-21 DIAGNOSIS — M8000XS Age-related osteoporosis with current pathological fracture, unspecified site, sequela: Secondary | ICD-10-CM

## 2018-09-21 DIAGNOSIS — M816 Localized osteoporosis [Lequesne]: Secondary | ICD-10-CM

## 2018-09-21 DIAGNOSIS — M545 Low back pain, unspecified: Secondary | ICD-10-CM

## 2018-09-21 DIAGNOSIS — K5909 Other constipation: Secondary | ICD-10-CM

## 2018-09-21 DIAGNOSIS — E782 Mixed hyperlipidemia: Secondary | ICD-10-CM | POA: Diagnosis not present

## 2018-09-21 MED ORDER — PLECANATIDE 3 MG PO TABS: 3.0000 mg | ORAL_TABLET | Freq: Every day | ORAL | 5 refills | Status: DC

## 2018-09-21 NOTE — Patient Instructions (Signed)

## 2018-09-21 NOTE — Progress Notes (Signed)
SLEEP MEDICINE CLINIC   Provider:  Larey Seat, MD   Primary Care Physician:  Eustaquio Maize, MD   Referring Provider: Eustaquio Maize, MD    Chief Complaint  Patient presents with  . New Patient (Initial Visit)    pt alone, rm 10. pt presents today and has been struggling with sleepiness and fatigue concerns. never had a sleep study. initially she thought was age related issues that was cause but started where she would have insomnia concerns. she states in most recent yrs she finds that she struggles but falls asleep with help of melatonin but staying asleep. she may get average 2-3 hrs before she wakes and then can take 1-3 hrs before going back to sleep. averages total 5 hrs in night. denies snoring or apnea.     HPI:  Lauren Shannon is a 63 y.o. female , seen here as in a referral from Dr. Evette Doffing for Insomnia, daytime sleepiness, racing thoughts.  Chief complaint according to patient :" I put all electronics away 30 minutes before bed and yet can't sleep easily."   Sleep habits are as follows: dinner time is typically 6-6.30 Pm and  She is asleep on her sofa for one hour at 7 PM. The real bedtime is 8.30-9 PM. Bedroom is cool ,quiet and dark. She dreams. She has nocturia twice. She goes to sleep with melatonin - taking it 2 hours before bedtime and falls SLEEP EASIER- 45 minutes. She can't stay asleep, either because of bathroom call or dreams waking her. Sometimes she is unsure of the trigger . She may stay awake for 2 hours. She has turned the clock away.  Her husband needs to rise at 4.45 AM. Her work requires to rise at 5.30 AM. 7 hours of sleep. Not refreshed and not rested.    Sleep medical history: she slept well into her 40[age] and from there deteriorated.  She became sleepier in daytime, struggles with fatigue. tonsilectomy at age 49- capped- They regrew. She had her last period 12-2010- and she thinks this contributed to sleep changes.    Family sleep history: sister  has some insomnia, mother had none.   No OSA, mother had DM, was a night owl.   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 Married, adult children ( 2 sons 17 and 24 years old, and 2 stepsons, 33, 77 )  , grandchildren, she and her husband live with 2 cats, are gainfully employed.  Never had shift work. Never smoker, ETOH none, caffeine - 2 cups in AM , no tea or soda.    Review of Systems: Out of a complete 14 system review, the patient complains of only the following symptoms, and all other reviewed systems are negative.  No snoring, no observed apnea, no RLS, insomnia. Racing thoughts.   Epworth score: 7/ 24  , Fatigue severity score 46/ 63  , depression score: 1/ 15    Social History   Socioeconomic History  . Marital status: Married    Spouse name: Not on file  . Number of children: Not on file  . Years of education: Not on file  . Highest education level: Not on file  Occupational History  . Not on file  Social Needs  . Financial resource strain: Not on file  . Food insecurity:    Worry: Not on file    Inability: Not on file  . Transportation needs:    Medical: Not on file    Non-medical: Not on file  Tobacco Use  . Smoking status: Never Smoker  . Smokeless tobacco: Never Used  Substance and Sexual Activity  . Alcohol use: Yes    Alcohol/week: 0.0 standard drinks    Comment: 6 per year  . Drug use: No  . Sexual activity: Not Currently    Partners: Male  Lifestyle  . Physical activity:    Days per week: Not on file    Minutes per session: Not on file  . Stress: Not on file  Relationships  . Social connections:    Talks on phone: Not on file    Gets together: Not on file    Attends religious service: Not on file    Active member of club or organization: Not on file    Attends meetings of clubs or organizations: Not on file    Relationship status: Not on file  . Intimate partner violence:    Fear of current or ex partner: Not on file    Emotionally  abused: Not on file    Physically abused: Not on file    Forced sexual activity: Not on file  Other Topics Concern  . Not on file  Social History Narrative  . Not on file    Family History  Problem Relation Age of Onset  . Diabetes Mother   . Hypertension Mother   . Heart disease Father   . Heart disease Brother   . Colon cancer Neg Hx   . Colon polyps Neg Hx     Past Medical History:  Diagnosis Date  . Allergy   . Arthritis    ostoarthritis  . Basal cell carcinoma   . Essential hypertension    pt states not currently being treated for this  . Headache   . History of pneumonia 1963  . Low back pain   . Mixed hyperlipidemia   . Type 2 diabetes mellitus ([place])    pt stated not currently being treated for this    Past Surgical History:  Procedure Laterality Date  . APPENDECTOMY    . CHOLECYSTECTOMY    . COLONOSCOPY N/A 01/28/2018   Procedure: COLONOSCOPY;  Surgeon: Danie Binder, MD;  Location: [facility];  Service: Endoscopy;  Laterality: N/A;  1:00  . PLANTAR FASCIA SURGERY Right 2012  . stimulation system implant  2015  . THYROID SURGERY  2005  . TONSILLECTOMY    . TUBAL LIGATION  1993  . Uterine polyp removal      Current Outpatient Medications  Medication Sig Dispense Refill  . acetaminophen (TYLENOL) 500 MG tablet Take 1,000 mg by mouth daily as needed for moderate pain.    Marland Kitchen aspirin 81 MG tablet Take 81 mg by mouth every evening.     . Calcium Citrate-Vitamin D (CALCIUM + D PO) Take 1 tablet by mouth daily.    . Carboxymethylcellulose Sodium (THERATEARS OP) Place 1 drop into both eyes daily.    . cetirizine (ZYRTEC) 10 MG tablet Take 10 mg by mouth daily.    . fluticasone (FLONASE) 50 MCG/ACT nasal spray Place 2 sprays into both nostrils daily. (Patient taking differently: Place 2 sprays into both nostrils daily as needed for allergies. ) 16 g 6  . HYDROcodone-acetaminophen (NORCO) 5-325 MG tablet Take 1 tablet by mouth every 6 (six) hours as needed for  moderate pain. 20 tablet 0  . ibuprofen (ADVIL,MOTRIN) 200 MG tablet Take 400 mg by mouth daily as needed for headache or moderate pain.    Marland Kitchen levothyroxine (SYNTHROID,  LEVOTHROID) 50 MCG tablet TAKE 1 TABLET DAILY 90 tablet 3  . mirabegron ER (MYRBETRIQ) 50 MG TB24 tablet Take 1 tablet (50 mg total) by mouth daily. 90 tablet 1  . Multiple Vitamin (MULTIVITAMIN) capsule Take 1 capsule by mouth daily.    Marland Kitchen Plecanatide (TRULANCE) 3 MG TABS Take 3 mg by mouth daily. 30 tablet 5  . predniSONE (STERAPRED UNI-PAK 21 TAB) 10 MG (21) TBPK tablet As directed x 6 days 21 tablet 0  . risedronate (ACTONEL) 150 MG tablet Take 1 tablet (150 mg total) by mouth every 30 (thirty) days. with water on empty stomach, nothing by mouth or lie down for next 30 minutes. 1 tablet 6  . traZODone (DESYREL) 100 MG tablet TAKE 1/2 TO 1 TABLET AT BEDTIME 90 tablet 1   No current facility-administered medications for this visit.     Allergies as of 09/21/2018 - Review Complete 09/21/2018  Allergen Reaction Noted  . Oxybutynin Shortness Of Breath and Palpitations 09/19/2018    Vitals: BP 122/84   Pulse 91   Ht 5\' 3"  (1.6 m)   Wt 203 lb (92.1 kg)   BMI 35.96 kg/m  Last Weight:  Wt Readings from Last 1 Encounters:  09/21/18 203 lb (92.1 kg)   CWC:BJSE mass index is 35.96 kg/m.     Last Height:   Ht Readings from Last 1 Encounters:  09/21/18 5\' 3"  (1.6 m)    Physical exam:  General: The patient is awake, alert and appears not in acute distress.  The patient is well groomed. Head: Normocephalic, atraumatic. Neck is supple. Tonsils present . Mallampati 2  neck circumference: 4. 5 -. Nasal airflow patent , crowded dental status. Retrognathia is not seen. TMJ click.  Cardiovascular:  Regular rate and rhythm , without  murmurs or carotid bruit, and without distended neck veins. Respiratory: Lungs are clear to auscultation. Skin:  Without evidence of edema, or rash Trunk: BMI is 36. The patient's posture is erect.     Neurologic exam : The patient is awake and alert, oriented to place and time.   Memory subjective described as intact.  Attention span & concentration ability appears normal.  Speech is fluent,  Without  dysarthria, dysphonia or aphasia.  Mood and affect are appropriate.  Cranial nerves: Pupils are equal and briskly reactive to light.  Extraocular movements  in vertical and horizontal planes intact and without nystagmus. Visual fields by finger perimetry are intact.Hearing to finger rub intact.  Facial sensation intact to fine touch. Facial motor strength is symmetric and tongue and uvula move midline. Shoulder shrug was symmetrical.  Motor exam:   Normal tone, muscle bulk and symmetric strength in all extremities. Sensory:  Fine touch, pinprick and vibration were normal. Coordination: penmanship changed over the last year. Rapid alternating movements in the fingers/hands was normal.  Finger-to-nose maneuver  normal without evidence of ataxia, dysmetria or tremor. Gait and station: Patient walks without assistive device . Stance is stable and normal.  Turns with 3 Steps.  Deep tendon reflexes: in the upper and lower extremities are symmetric and intact. Babinski maneuver response is downgoing.  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   2) osteoporosis-  there is  also some evidence of weakness of the hip flexors, adductors, but good balance here on gait testing. She needs to brace herself to get up from a chair. Had 2 falls, tripping over a threshold.    3) nocturia - she has tried detrol, but had palpitations. Uses an interstim implant.   I can start with a HST to  rule out apnea, followed by an atteneded study to evaluate other causes of insomnia.     The patient was advised of the nature of the diagnosed disorder , the treatment options and the  risks for general health and wellness arising from not treating the condition.   I spent more than 45  minutes of face to face time with the patient.  Greater than 50% of time was spent in counseling and coordination of care. We have discussed the diagnosis and differential and I answered the patient's questions.    Plan:  Treatment plan and additional workup :  Start with HST, in a non snoring person- followed by PSG EEG montage.   Larey Seat, MD 3/83/8184, 0:37 PM  Certified in Neurology by ABPN Certified in [place] by [facility] [address] [place], [place] [address]

## 2018-09-22 NOTE — Progress Notes (Signed)
BP 119/77   Pulse 69   Temp (!) 96.9 F (36.1 C) (Oral)   Ht 5\' 5"  (1.651 m)   Wt 205 lb 3.2 oz (93.1 kg)   BMI 34.15 kg/m    Subjective:    Patient ID: Lauren Shannon, female    DOB: August 26, 1956, 63 y.o.   MRN: [id number]  HPI: Lauren Shannon is a 63 y.o. female presenting on 09/19/2018 for Back Pain (spinal pain for a couple of weeks )  This patient comes in for low back pain that is has been in her lower thoracic and upper lumbar area.  She has known degenerative disc disease.  She has had surgery on it and has pain stimulator placed.  She states the pain is quite severe at times and no medication and position have helped.    Past Medical History:  Diagnosis Date  . Allergy   . Arthritis    ostoarthritis  . Basal cell carcinoma   . Essential hypertension    pt states not currently being treated for this  . Headache   . History of pneumonia 1963  . Low back pain   . Mixed hyperlipidemia   . Type 2 diabetes mellitus ([place])    pt stated not currently being treated for this   Relevant past medical, surgical, family and social history reviewed and updated as indicated. Interim medical history since our last visit reviewed. Allergies and medications reviewed and updated. DATA REVIEWED: CHART IN EPIC  Family History reviewed for pertinent findings.  Review of Systems  Constitutional: Negative.   HENT: Negative.   Eyes: Negative.   Respiratory: Negative.   Gastrointestinal: Negative.   Genitourinary: Negative.   Musculoskeletal: Positive for arthralgias and back pain.    Allergies as of 09/19/2018      Reactions   Oxybutynin Shortness Of Breath, Palpitations      Medication List       Accurate as of September 19, 2018 11:59 PM. Always use your most recent med list.        acetaminophen 500 MG tablet Commonly known as:  TYLENOL Take 1,000 mg by mouth daily as needed for moderate pain.   aspirin 81 MG tablet Take 81 mg by mouth every evening.   CALCIUM + D  PO Take 1 tablet by mouth daily.   cetirizine 10 MG tablet Commonly known as:  ZYRTEC Take 10 mg by mouth daily.   fluticasone 50 MCG/ACT nasal spray Commonly known as:  FLONASE Place 2 sprays into both nostrils daily.   HYDROcodone-acetaminophen 5-325 MG tablet Commonly known as:  NORCO Take 1 tablet by mouth every 6 (six) hours as needed for moderate pain.   ibuprofen 200 MG tablet Commonly known as:  ADVIL,MOTRIN Take 400 mg by mouth daily as needed for headache or moderate pain.   levothyroxine 50 MCG tablet Commonly known as:  SYNTHROID, LEVOTHROID TAKE 1 TABLET DAILY   linaclotide 145 MCG Caps capsule Commonly known as:  LINZESS TAKE (1) CAPSULE DAILY BEFORE BREAKFAST.   mirabegron ER 50 MG Tb24 tablet Commonly known as:  MYRBETRIQ Take 1 tablet (50 mg total) by mouth daily.   multivitamin capsule Take 1 capsule by mouth daily.   predniSONE 10 MG (21) Tbpk tablet Commonly known as:  STERAPRED UNI-PAK 21 TAB As directed x 6 days   risedronate 150 MG tablet Commonly known as:  ACTONEL Take 1 tablet (150 mg total) by mouth every 30 (thirty) days. with water on empty stomach,  nothing by mouth or lie down for next 30 minutes.   THERATEARS OP Place 1 drop into both eyes daily.   traZODone 100 MG tablet Commonly known as:  DESYREL TAKE 1/2 TO 1 TABLET AT BEDTIME          Objective:    BP 119/77   Pulse 69   Temp (!) 96.9 F (36.1 C) (Oral)   Ht 5\' 5"  (1.651 m)   Wt 205 lb 3.2 oz (93.1 kg)   BMI 34.15 kg/m   Allergies  Allergen Reactions  . Oxybutynin Shortness Of Breath and Palpitations    Wt Readings from Last 3 Encounters:  09/21/18 203 lb (92.1 kg)  09/19/18 205 lb 3.2 oz (93.1 kg)  07/20/18 198 lb (89.8 kg)    Physical Exam Constitutional:      Appearance: She is well-developed.  HENT:     Head: Normocephalic and atraumatic.  Eyes:     Conjunctiva/sclera: Conjunctivae normal.     Pupils: Pupils are equal, round, and reactive to light.   Cardiovascular:     Rate and Rhythm: Normal rate and regular rhythm.     Heart sounds: Normal heart sounds.  Pulmonary:     Effort: Pulmonary effort is normal.     Breath sounds: Normal breath sounds.  Abdominal:     General: Bowel sounds are normal.     Palpations: Abdomen is soft.  Musculoskeletal:     Lumbar back: She exhibits decreased range of motion, tenderness, pain and spasm.       Back:  Skin:    General: Skin is warm and dry.     Findings: No rash.  Neurological:     Mental Status: She is alert and oriented to person, place, and time.     Deep Tendon Reflexes: Reflexes are normal and symmetric.  Psychiatric:        Behavior: Behavior normal.        Thought Content: Thought content normal.        Judgment: Judgment normal.     Results for orders placed or performed in visit on 07/20/18  TSH  Result Value Ref Range   TSH 2.400 0.450 - 4.500 uIU/mL      Assessment & Plan:   1. Localized osteoporosis, unspecified pathological fracture presence - DG Thoracic Spine 2 View; Future - DG Lumbar Spine 2-3 Views; Future  2. Chronic midline thoracic back pain - DG Thoracic Spine 2 View; Future - DG Lumbar Spine 2-3 Views; Future - HYDROcodone-acetaminophen (NORCO) 5-325 MG tablet; Take 1 tablet by mouth every 6 (six) hours as needed for moderate pain.  Dispense: 20 tablet; Refill: 0 - predniSONE (STERAPRED UNI-PAK 21 TAB) 10 MG (21) TBPK tablet; As directed x 6 days  Dispense: 21 tablet; Refill: 0   Continue all other maintenance medications as listed above.  Follow up plan: No follow-ups on file.  Educational handout given for [place] PA-C [facility] [address]  [place], [place] [address] [phone]   09/22/2018, 2:24 PM

## 2018-09-26 ENCOUNTER — Encounter: Payer: Self-pay | Admitting: Neurology

## 2018-10-03 DIAGNOSIS — M546 Pain in thoracic spine: Secondary | ICD-10-CM | POA: Diagnosis not present

## 2018-10-03 DIAGNOSIS — M545 Low back pain: Secondary | ICD-10-CM | POA: Diagnosis not present

## 2018-10-10 ENCOUNTER — Encounter: Payer: Self-pay | Admitting: Family Medicine

## 2018-10-10 ENCOUNTER — Ambulatory Visit: Payer: BLUE CROSS/BLUE SHIELD | Admitting: Family Medicine

## 2018-10-10 VITALS — BP 124/70 | HR 73 | Temp 96.7°F | Ht 63.0 in | Wt 204.0 lb

## 2018-10-10 DIAGNOSIS — M546 Pain in thoracic spine: Secondary | ICD-10-CM | POA: Diagnosis not present

## 2018-10-10 DIAGNOSIS — G8929 Other chronic pain: Secondary | ICD-10-CM | POA: Diagnosis not present

## 2018-10-10 MED ORDER — HYDROCODONE-ACETAMINOPHEN 5-325 MG PO TABS: 1.0000 | ORAL_TABLET | Freq: Four times a day (QID) | ORAL | 0 refills | Status: DC | PRN

## 2018-10-10 NOTE — Progress Notes (Signed)
Subjective:  Patient ID: Lauren Shannon, female    DOB: October 09, 1955  Age: 63 y.o. MRN: [id number]  CC: Back Pain (pt here today to get pain medication for her back)   HPI Lauren Shannon presents for continued mid to lower back pain.  She says this started in mid January.  She received relief when she tried hydrocodone.  Limited supply given since it was considered acute at the time.  She has a history of degenerative disc disease.  That has progressed.  Now pain is 10/10.  It is in the midline of her back some radiation early on downward into the into the lower back bilaterally.  She says that it is relieved by rest in the mornings but as the day goes on it continues to increase daily.  Hydrocodone reduces the pain to 1/10.  She has an indwelling bladder stimulator so she cannot have an MRI done.  She saw the PA for Dr. Sherwood Gambler last week and they are working on getting a CT myelogram approved for her and instead.  When that is done though have a better idea what to do definitively for her.  In the meantime she just needs some relief.  Depression screen [facility] 2/9 09/19/2018 07/20/2018 05/06/2018  Decreased Interest 1 0 0  Down, Depressed, Hopeless 0 0 0  PHQ - 2 Score 1 0 0    History Lauren Shannon has a past medical history of Allergy, Arthritis, Basal cell carcinoma, Essential hypertension, Headache, History of pneumonia (1963), Low back pain, Mixed hyperlipidemia, and Type 2 diabetes mellitus ([place]).   She has a past surgical history that includes Thyroid surgery (2005); Uterine polyp removal; Tubal ligation (1993); Cholecystectomy; Appendectomy; Tonsillectomy; Plantar fascia surgery (Right, 2012); stimulation system implant (2015); and Colonoscopy (N/A, 01/28/2018).   Her family history includes Diabetes in her mother; Heart disease in her brother and father; Hypertension in her mother.She reports that she has never smoked. She has never used smokeless tobacco. She reports current alcohol use. She reports that  she does not use drugs.    ROS Review of Systems  Constitutional: Negative.   HENT: Negative.   Eyes: Negative for visual disturbance.  Respiratory: Negative for shortness of breath.   Cardiovascular: Negative for chest pain.  Gastrointestinal: Negative for abdominal pain.  Musculoskeletal: Positive for arthralgias and back pain.  Neurological: Negative for dizziness and headaches.  Psychiatric/Behavioral: Negative for agitation, dysphoric mood and sleep disturbance.    Objective:  BP 124/70   Pulse 73   Temp (!) 96.7 F (35.9 C) (Oral)   Ht 5\' 3"  (1.6 m)   Wt 204 lb (92.5 kg)   BMI 36.14 kg/m   BP Readings from Last 3 Encounters:  10/10/18 124/70  09/21/18 122/84  09/19/18 119/77    Wt Readings from Last 3 Encounters:  10/10/18 204 lb (92.5 kg)  09/21/18 203 lb (92.1 kg)  09/19/18 205 lb 3.2 oz (93.1 kg)     Physical Exam Constitutional:      General: She is not in acute distress.    Appearance: She is well-developed.  HENT:     Head: Normocephalic and atraumatic.  Eyes:     Conjunctiva/sclera: Conjunctivae normal.     Pupils: Pupils are equal, round, and reactive to light.  Neck:     Musculoskeletal: Normal range of motion and neck supple.  Cardiovascular:     Rate and Rhythm: Normal rate and regular rhythm.     Heart sounds: Normal heart sounds. No murmur.  Pulmonary:     Effort: Pulmonary effort is normal. No respiratory distress.     Breath sounds: Normal breath sounds. No wheezing or rales.  Abdominal:     General: Bowel sounds are normal. There is no distension.     Palpations: Abdomen is soft.     Tenderness: There is no abdominal tenderness.  Musculoskeletal:        General: Tenderness (t12- L2 midline) present.     Lumbar back: She exhibits decreased range of motion, tenderness and spasm. She exhibits no deformity and normal pulse.  Skin:    General: Skin is warm and dry.  Neurological:     Mental Status: She is alert and oriented to  person, place, and time.     Deep Tendon Reflexes: Reflexes are normal and symmetric.  Psychiatric:        Behavior: Behavior normal.        Thought Content: Thought content normal.       Assessment & Plan:   Lauren Shannon was seen today for back pain.  Diagnoses and all orders for this visit:  Chronic midline thoracic back pain -     ToxASSURE Select 13 (MW), Urine -     HYDROcodone-acetaminophen (NORCO) 5-325 MG tablet; Take 1 tablet by mouth every 6 (six) hours as needed for moderate pain or severe pain.       I have discontinued Lauren Shannon's fluticasone and predniSONE. I have also changed her HYDROcodone-acetaminophen. Additionally, I am having her maintain her multivitamin, aspirin, Calcium Citrate-Vitamin D (CALCIUM + D PO), ibuprofen, acetaminophen, Carboxymethylcellulose Sodium (THERATEARS OP), traZODone, risedronate, mirabegron ER, cetirizine, levothyroxine, and Plecanatide.  Allergies as of 10/10/2018      Reactions   Oxybutynin Shortness Of Breath, Palpitations      Medication List       Accurate as of October 10, 2018  6:08 PM. Always use your most recent med list.        acetaminophen 500 MG tablet Commonly known as:  TYLENOL Take 1,000 mg by mouth daily as needed for moderate pain.   aspirin 81 MG tablet Take 81 mg by mouth every evening.   CALCIUM + D PO Take 1 tablet by mouth daily.   cetirizine 10 MG tablet Commonly known as:  ZYRTEC Take 10 mg by mouth daily.   HYDROcodone-acetaminophen 5-325 MG tablet Commonly known as:  NORCO Take 1 tablet by mouth every 6 (six) hours as needed for moderate pain or severe pain.   ibuprofen 200 MG tablet Commonly known as:  ADVIL,MOTRIN Take 400 mg by mouth daily as needed for headache or moderate pain.   levothyroxine 50 MCG tablet Commonly known as:  SYNTHROID, LEVOTHROID TAKE 1 TABLET DAILY   mirabegron ER 50 MG Tb24 tablet Commonly known as:  MYRBETRIQ Take 1 tablet (50 mg total) by mouth daily.     multivitamin capsule Take 1 capsule by mouth daily.   Plecanatide 3 MG Tabs Commonly known as:  TRULANCE Take 3 mg by mouth daily.   risedronate 150 MG tablet Commonly known as:  ACTONEL Take 1 tablet (150 mg total) by mouth every 30 (thirty) days. with water on empty stomach, nothing by mouth or lie down for next 30 minutes.   THERATEARS OP Place 1 drop into both eyes daily.   traZODone 100 MG tablet Commonly known as:  DESYREL TAKE 1/2 TO 1 TABLET AT BEDTIME        Follow-up: Return in about 2 weeks (around 10/24/2018).  Cletus Gash  Belen Zwahlen, M.D.

## 2018-10-14 ENCOUNTER — Other Ambulatory Visit (HOSPITAL_COMMUNITY): Payer: Self-pay | Admitting: Obstetrics & Gynecology

## 2018-10-14 ENCOUNTER — Other Ambulatory Visit: Payer: Self-pay | Admitting: Physician Assistant

## 2018-10-14 ENCOUNTER — Telehealth: Payer: Self-pay | Admitting: Nurse Practitioner

## 2018-10-14 DIAGNOSIS — M545 Low back pain, unspecified: Secondary | ICD-10-CM

## 2018-10-14 DIAGNOSIS — M546 Pain in thoracic spine: Secondary | ICD-10-CM

## 2018-10-14 DIAGNOSIS — Z1231 Encounter for screening mammogram for malignant neoplasm of breast: Secondary | ICD-10-CM

## 2018-10-14 LAB — TOXASSURE SELECT 13 (MW), URINE

## 2018-10-14 NOTE — Telephone Encounter (Signed)
Phone call to patient to verify medication list and allergies for myelogram procedure. Pt instructed to hold Trazodone for 48hrs prior to myelogram appointment time. Pt verbalized understanding.

## 2018-10-24 ENCOUNTER — Encounter: Payer: Self-pay | Admitting: Family Medicine

## 2018-10-24 ENCOUNTER — Ambulatory Visit: Payer: BLUE CROSS/BLUE SHIELD | Admitting: Family Medicine

## 2018-10-24 VITALS — BP 128/73 | HR 67 | Temp 97.4°F | Ht 65.0 in | Wt 207.1 lb

## 2018-10-24 DIAGNOSIS — M546 Pain in thoracic spine: Secondary | ICD-10-CM

## 2018-10-24 DIAGNOSIS — G8929 Other chronic pain: Secondary | ICD-10-CM

## 2018-10-24 NOTE — Progress Notes (Signed)
Chief Complaint  Patient presents with  . Back Pain    pt here today for 2 week recheck of her back pain which she says is still bothering her    HPI  Patient presents today for continued low back pain.  It is been unchanged since she was here 2 weeks ago.  It goes to 10/10 at times.  My previous note was reviewed and she noted that she could not have an MRI because of indwelling bladder stimulator.  She now has gotten approval for the CT myelogram.  It is to be done later this week. She has an upcoming appointment with Dr. Rita Ohara.  This is scheduled for Wednesday, March 4.  Pain is limiting her daily routine.  She is able to do ADLs. She reserves thhe medication for when the pain is at its worst. She has several left.  PMH: Smoking status noted ROS: Per HPI  Objective: BP 128/73   Pulse 67   Temp (!) 97.4 F (36.3 C) (Oral)   Ht 5\' 5"  (1.651 m)   Wt 207 lb 2 oz (94 kg)   BMI 34.47 kg/m  Gen: NAD, alert, cooperative with exam HEENT: NCAT, EOMI, PERRL CV: RRR, good S1/S2, no murmur Resp: CTABL, no wheezes, non-labored Abd: SNTND, BS present, no guarding or organomegaly Ext: No edema, warm.  Painful flexion and extension of the thoracic spine. Neuro: Alert and oriented, No gross deficits  Assessment and plan:  1. Chronic midline thoracic back pain     Meds ordered this encounter  Medications  . HYDROcodone-acetaminophen (NORCO) 5-325 MG tablet    Sig: Take 1 tablet by mouth every 6 (six) hours as needed for moderate pain or severe pain.    Dispense:  60 tablet    Refill:  0    Avoid physical activity until myelogram is done.  Follow up as needed.  [name], MD

## 2018-10-25 ENCOUNTER — Encounter: Payer: Self-pay | Admitting: Family Medicine

## 2018-10-25 MED ORDER — HYDROCODONE-ACETAMINOPHEN 5-325 MG PO TABS: 1.0000 | ORAL_TABLET | Freq: Four times a day (QID) | ORAL | 0 refills | Status: DC | PRN

## 2018-10-28 ENCOUNTER — Ambulatory Visit
Admission: RE | Admit: 2018-10-28 | Discharge: 2018-10-28 | Disposition: A | Discharge status: Home / Self Care | Payer: BLUE CROSS/BLUE SHIELD | Source: Ambulatory Visit | Attending: Physician Assistant | Admitting: Physician Assistant

## 2018-10-28 ENCOUNTER — Other Ambulatory Visit: Payer: Self-pay

## 2018-10-28 DIAGNOSIS — M545 Low back pain, unspecified: Secondary | ICD-10-CM

## 2018-10-28 DIAGNOSIS — M546 Pain in thoracic spine: Secondary | ICD-10-CM

## 2018-10-28 DIAGNOSIS — M5124 Other intervertebral disc displacement, thoracic region: Secondary | ICD-10-CM | POA: Diagnosis not present

## 2018-10-28 DIAGNOSIS — M48061 Spinal stenosis, lumbar region without neurogenic claudication: Secondary | ICD-10-CM | POA: Diagnosis not present

## 2018-10-28 MED ORDER — IOPAMIDOL (ISOVUE-M 300) INJECTION 61%: 10.0000 mL | Freq: Once | INTRAMUSCULAR | Status: DC | PRN

## 2018-10-28 MED ORDER — DIAZEPAM 5 MG PO TABS
5.0000 mg | ORAL_TABLET | Freq: Once | ORAL | Status: AC
  Administered 2018-10-28: 5 mg via ORAL

## 2018-10-28 NOTE — Discharge Instructions (Signed)
Myelogram Discharge Instructions  1. Go home and rest quietly for the next 24 hours.  It is important to lie flat for the next 24 hours.  Get up only to go to the restroom.  You may lie in the bed or on a couch on your back, your stomach, your left side or your right side.  You may have one pillow under your head.  You may have pillows between your knees while you are on your side or under your knees while you are on your back.  2. DO NOT drive today.  Recline the seat as far back as it will go, while still wearing your seat belt, on the way home.  3. You may get up to go to the bathroom as needed.  You may sit up for 10 minutes to eat.  You may resume your normal diet and medications unless otherwise indicated.  Drink lots of extra fluids today and tomorrow.  4. The incidence of headache, nausea, or vomiting is about 5% (one in 20 patients).  If you develop a headache, lie flat and drink plenty of fluids until the headache goes away.  Caffeinated beverages may be helpful.  If you develop severe nausea and vomiting or a headache that does not go away with flat bed rest, call [phone].  5. You may resume normal activities after your 24 hours of bed rest is over; however, do not exert yourself strongly or do any heavy lifting tomorrow. If when you get up you have a headache when standing, go back to bed and force fluids for another 24 hours.  6. Call your physician for a follow-up appointment.  The results of your myelogram will be sent directly to your physician by the following day.  7. If you have any questions or if complications develop after you arrive home, please call [phone].  Discharge instructions have been explained to the patient.  The patient, or the person responsible for the patient, fully understands these instructions.  YOU MAY RESTART YOUR TRAZODONE TOMORROW 10/29/2018 AT 1:00PM.

## 2018-10-28 NOTE — Progress Notes (Signed)
Pt reports she has not taken Trazodone since Monday 10/24/2018

## 2018-11-02 DIAGNOSIS — M545 Low back pain: Secondary | ICD-10-CM | POA: Diagnosis not present

## 2018-11-02 DIAGNOSIS — R030 Elevated blood-pressure reading, without diagnosis of hypertension: Secondary | ICD-10-CM | POA: Diagnosis not present

## 2018-11-02 DIAGNOSIS — M4316 Spondylolisthesis, lumbar region: Secondary | ICD-10-CM | POA: Diagnosis not present

## 2018-11-02 DIAGNOSIS — Z6833 Body mass index (BMI) 33.0-33.9, adult: Secondary | ICD-10-CM | POA: Diagnosis not present

## 2018-11-03 ENCOUNTER — Other Ambulatory Visit: Payer: Self-pay | Admitting: Physician Assistant

## 2018-11-03 DIAGNOSIS — M4316 Spondylolisthesis, lumbar region: Secondary | ICD-10-CM

## 2018-11-04 ENCOUNTER — Ambulatory Visit: Payer: BLUE CROSS/BLUE SHIELD | Admitting: Pediatrics

## 2018-11-11 ENCOUNTER — Ambulatory Visit
Admission: RE | Admit: 2018-11-11 | Discharge: 2018-11-11 | Disposition: A | Discharge status: Home / Self Care | Payer: BLUE CROSS/BLUE SHIELD | Source: Ambulatory Visit | Attending: Physician Assistant | Admitting: Physician Assistant

## 2018-11-11 ENCOUNTER — Other Ambulatory Visit: Payer: Self-pay | Admitting: Physician Assistant

## 2018-11-11 ENCOUNTER — Other Ambulatory Visit: Payer: Self-pay

## 2018-11-11 DIAGNOSIS — M4316 Spondylolisthesis, lumbar region: Secondary | ICD-10-CM

## 2018-11-11 DIAGNOSIS — M47817 Spondylosis without myelopathy or radiculopathy, lumbosacral region: Secondary | ICD-10-CM | POA: Diagnosis not present

## 2018-11-11 MED ORDER — METHYLPREDNISOLONE ACETATE 40 MG/ML INJ SUSP (RADIOLOG
120.0000 mg | Freq: Once | INTRAMUSCULAR | Status: AC
  Administered 2018-11-11: 120 mg via INTRA_ARTICULAR

## 2018-11-11 NOTE — Discharge Instructions (Signed)

## 2018-11-16 ENCOUNTER — Ambulatory Visit: Payer: BLUE CROSS/BLUE SHIELD | Admitting: Family Medicine

## 2018-11-16 ENCOUNTER — Encounter: Payer: Self-pay | Admitting: Family Medicine

## 2018-11-16 ENCOUNTER — Other Ambulatory Visit: Payer: Self-pay

## 2018-11-16 VITALS — BP 123/73 | HR 80 | Temp 98.9°F | Ht 65.0 in | Wt 199.4 lb

## 2018-11-16 DIAGNOSIS — J40 Bronchitis, not specified as acute or chronic: Secondary | ICD-10-CM

## 2018-11-16 DIAGNOSIS — J329 Chronic sinusitis, unspecified: Secondary | ICD-10-CM

## 2018-11-16 MED ORDER — GUAIFENESIN-CODEINE 100-10 MG/5ML PO SYRP: 5.0000 mL | ORAL_SOLUTION | ORAL | 0 refills | Status: DC | PRN

## 2018-11-16 MED ORDER — CLARITHROMYCIN 500 MG PO TABS: 500.0000 mg | ORAL_TABLET | Freq: Two times a day (BID) | ORAL | 0 refills | Status: AC

## 2018-11-16 NOTE — Progress Notes (Signed)
Chief Complaint  Patient presents with  . Cough    pt here today c/o productive cough for the past few days and yesterday noticed some blood in it    HPI  Patient presents today for Patient presents with upper respiratory congestion. Rhinorrhea that is frequently purulent. There is moderate sore throat. Patient reports coughing frequently as well.  Yellow and green sputum noted. Blood tinged twice yesterday There is  Low grade fever. No chills or sweats. The patient denies being short of breath. Onset was 3-5 days ago. Gradually worsening. Tried OTCs without improvement.  PMH: Smoking status noted ROS: Per HPI  Objective: BP 123/73   Pulse 80   Temp 98.9 F (37.2 C) (Oral)   Ht 5\' 5"  (1.651 m)   Wt 199 lb 6 oz (90.4 kg)   BMI 33.18 kg/m  Gen: NAD, alert, cooperative with exam HEENT: NCAT, Nasal passages swollen, red TMS RED CV: RRR, good S1/S2, no murmur Resp: Bronchitis changes with scattered wheezes, non-labored Ext: No edema, warm Neuro: Alert and oriented, No gross deficits  Assessment and plan:  1. Sinobronchitis     Meds ordered this encounter  Medications  . clarithromycin (BIAXIN) 500 MG tablet    Sig: Take 1 tablet (500 mg total) by mouth 2 (two) times daily for 10 days.    Dispense:  20 tablet    Refill:  0  . guaiFENesin-codeine (CHERATUSSIN AC) 100-10 MG/5ML syrup    Sig: Take 5 mLs by mouth every 4 (four) hours as needed for cough.    Dispense:  180 mL    Refill:  0    No orders of the defined types were placed in this encounter.   Follow up as needed.  [name], MD

## 2018-11-28 ENCOUNTER — Other Ambulatory Visit: Payer: Self-pay | Admitting: Pediatrics

## 2018-11-28 DIAGNOSIS — M810 Age-related osteoporosis without current pathological fracture: Secondary | ICD-10-CM

## 2019-01-16 ENCOUNTER — Encounter: Payer: Self-pay | Admitting: Family

## 2019-01-16 ENCOUNTER — Other Ambulatory Visit: Payer: Self-pay

## 2019-01-16 ENCOUNTER — Ambulatory Visit (INDEPENDENT_AMBULATORY_CARE_PROVIDER_SITE_OTHER): Payer: BLUE CROSS/BLUE SHIELD | Admitting: Family

## 2019-01-16 DIAGNOSIS — A084 Viral intestinal infection, unspecified: Secondary | ICD-10-CM | POA: Diagnosis not present

## 2019-01-16 MED ORDER — ONDANSETRON HCL 4 MG PO TABS: 4.0000 mg | ORAL_TABLET | Freq: Three times a day (TID) | ORAL | 0 refills | Status: DC | PRN

## 2019-01-16 NOTE — Progress Notes (Signed)
   Virtual Visit via telephone Note  I connected with Lauren Shannon on 01/16/19 at 11:16 AM by telephone and verified that I am speaking with the correct person using two identifiers. Lauren Shannon is currently located at home and no one is currently with her during visit. The provider, Evelina Dun, FNP is located in their office at time of visit.  I discussed the limitations, risks, security and privacy concerns of performing an evaluation and management service by telephone and the availability of in person appointments. I also discussed with the patient that there may be a patient responsible charge related to this service. The patient expressed understanding and agreed to proceed.   History and Present Illness:  Diarrhea   This is a new problem. The current episode started in the past 7 days. The problem occurs 5 to 10 times per day. The problem has been gradually improving. The stool consistency is described as watery. Associated symptoms include bloating, chills and a fever. Pertinent negatives include no vomiting. Associated symptoms comments: Nausea and dry heaves . Risk factors include ill contacts (husband). She has tried increased fluids for the symptoms. The treatment provided mild relief.      Review of Systems  Constitutional: Positive for chills and fever.  Gastrointestinal: Positive for bloating and diarrhea. Negative for vomiting.  All other systems reviewed and are negative.    Observations/Objective: No SOB or distress noted  Assessment and Plan: 1. Viral gastroenteritis Rest Force fluids BRAT diet Keep follow up with PCP for this Friday If weakness continues will need to call office  - ondansetron (ZOFRAN) 4 MG tablet; Take 1 tablet (4 mg total) by mouth every 8 (eight) hours as needed for nausea or vomiting.  Dispense: 20 tablet; Refill: 0     I discussed the assessment and treatment plan with the patient. The patient was provided an opportunity to ask  questions and all were answered. The patient agreed with the plan and demonstrated an understanding of the instructions.   The patient was advised to call back or seek an in-person evaluation if the symptoms worsen or if the condition fails to improve as anticipated.  The above assessment and management plan was discussed with the patient. The patient verbalized understanding of and has agreed to the management plan. Patient is aware to call the clinic if symptoms persist or worsen. Patient is aware when to return to the clinic for a follow-up visit. Patient educated on when it is appropriate to go to the emergency department.   Time call ended:  11:32 AM  I provided 16 minutes of non-face-to-face time during this encounter.    Evelina Dun, FNP

## 2019-01-19 ENCOUNTER — Other Ambulatory Visit: Payer: Self-pay

## 2019-01-20 ENCOUNTER — Encounter: Payer: Self-pay | Admitting: Family Medicine

## 2019-01-20 ENCOUNTER — Ambulatory Visit: Payer: BLUE CROSS/BLUE SHIELD | Admitting: Family Medicine

## 2019-01-20 VITALS — BP 102/64 | HR 87 | Temp 98.1°F | Ht 65.0 in | Wt 204.0 lb

## 2019-01-20 DIAGNOSIS — N3941 Urge incontinence: Secondary | ICD-10-CM

## 2019-01-20 DIAGNOSIS — K5909 Other constipation: Secondary | ICD-10-CM

## 2019-01-20 DIAGNOSIS — E039 Hypothyroidism, unspecified: Secondary | ICD-10-CM

## 2019-01-20 DIAGNOSIS — M810 Age-related osteoporosis without current pathological fracture: Secondary | ICD-10-CM

## 2019-01-20 DIAGNOSIS — Z1159 Encounter for screening for other viral diseases: Secondary | ICD-10-CM | POA: Diagnosis not present

## 2019-01-20 DIAGNOSIS — I10 Essential (primary) hypertension: Secondary | ICD-10-CM | POA: Diagnosis not present

## 2019-01-20 DIAGNOSIS — E782 Mixed hyperlipidemia: Secondary | ICD-10-CM | POA: Diagnosis not present

## 2019-01-20 DIAGNOSIS — G8929 Other chronic pain: Secondary | ICD-10-CM | POA: Diagnosis not present

## 2019-01-20 DIAGNOSIS — M5136 Other intervertebral disc degeneration, lumbar region: Secondary | ICD-10-CM

## 2019-01-20 DIAGNOSIS — G4700 Insomnia, unspecified: Secondary | ICD-10-CM

## 2019-01-20 DIAGNOSIS — M546 Pain in thoracic spine: Secondary | ICD-10-CM | POA: Diagnosis not present

## 2019-01-20 MED ORDER — HYDROCODONE-ACETAMINOPHEN 5-325 MG PO TABS: 1.0000 | ORAL_TABLET | Freq: Four times a day (QID) | ORAL | 0 refills | Status: DC | PRN

## 2019-01-20 MED ORDER — LEVOTHYROXINE SODIUM 50 MCG PO TABS: 50.0000 ug | ORAL_TABLET | Freq: Every day | ORAL | 1 refills | Status: DC

## 2019-01-20 MED ORDER — RISEDRONATE SODIUM 150 MG PO TABS: 150.0000 mg | ORAL_TABLET | ORAL | 5 refills | Status: DC

## 2019-01-20 MED ORDER — MIRABEGRON ER 50 MG PO TB24: 50.0000 mg | ORAL_TABLET | Freq: Every day | ORAL | 1 refills | Status: DC

## 2019-01-20 MED ORDER — PREGABALIN 50 MG PO CAPS: ORAL_CAPSULE | ORAL | 0 refills | Status: DC

## 2019-01-20 MED ORDER — PLECANATIDE 3 MG PO TABS: 3.0000 mg | ORAL_TABLET | Freq: Every day | ORAL | 1 refills | Status: DC

## 2019-01-20 MED ORDER — TRAZODONE HCL 100 MG PO TABS: ORAL_TABLET | ORAL | 1 refills | Status: DC

## 2019-01-20 NOTE — Progress Notes (Signed)
Subjective:  Patient ID: Lauren Shannon,  female    DOB: [birth year]  Age: 63 y.o.    CC: Medical Management of Chronic Issues   HPI Lauren Shannon presents for  follow-up of patient presents for follow-up on  thyroid. The patient has a history of hypothyroidism for many years. It has been stable recently. Pt. denies any change in  voice, loss of hair, heat or cold intolerance. Energy level has been adequate to good. Patient denies constipation and diarrhea. No myxedema. Medication is as noted below. Verified that pt is taking it daily on an empty stomach. Well tolerated.  Follow-up of diabetes. She lost a lot of weight a few years ago and her A1c has been in normal range without meds ever since then.  Patient has been through a CT myelogram and multiple epidural steroid injections since her last visit.  Those notes from her chart were reviewed.  Showing multiple bulging/herniated thoracic discs. The worsst at T7-8 disks along with scoliosis.  Patient tells me that she didn't tolerate gabapentin in the past due to side effects.She also has spinal stimulator implant. Pain is 3/10 in the morning, but by the end of the day it has surged to 8-9/10.  PDMP review shows no discrepancy.   Pt. Donated blood and was found to have positive HBcore antibody. HBSAg was negative.   History Monic has a past medical history of Allergy, Arthritis, Basal cell carcinoma, Essential hypertension, Headache, History of pneumonia (1963), Low back pain, Mixed hyperlipidemia, and Type 2 diabetes mellitus ([place]).   She has a past surgical history that includes Thyroid surgery (2005); Uterine polyp removal; Tubal ligation (1993); Cholecystectomy; Appendectomy; Tonsillectomy; Plantar fascia surgery (Right, 2012); stimulation system implant (2015); and Colonoscopy (N/A, 01/28/2018).   Her family history includes Diabetes in her mother; Heart disease in her brother and father; Hypertension in her mother.She reports that she  has never smoked. She has never used smokeless tobacco. She reports current alcohol use. She reports that she does not use drugs.  Current Outpatient Medications on File Prior to Visit  Medication Sig Dispense Refill   acetaminophen (TYLENOL) 500 MG tablet Take 1,000 mg by mouth daily as needed for moderate pain.     aspirin 81 MG tablet Take 81 mg by mouth every evening.      Calcium Citrate-Vitamin D (CALCIUM + D PO) Take 1 tablet by mouth daily.     Carboxymethylcellulose Sodium (THERATEARS OP) Place 1 drop into both eyes daily.     cetirizine (ZYRTEC) 10 MG tablet Take 10 mg by mouth daily.     ibuprofen (ADVIL,MOTRIN) 200 MG tablet Take 400 mg by mouth daily as needed for headache or moderate pain.     Multiple Vitamin (MULTIVITAMIN) capsule Take 1 capsule by mouth daily.     ondansetron (ZOFRAN) 4 MG tablet Take 1 tablet (4 mg total) by mouth every 8 (eight) hours as needed for nausea or vomiting. 20 tablet 0   No current facility-administered medications on file prior to visit.     ROS Review of Systems  Constitutional: Negative.   HENT: Negative for congestion.   Eyes: Negative for visual disturbance.  Respiratory: Negative for shortness of breath.   Cardiovascular: Negative for chest pain.  Gastrointestinal: Positive for constipation (chronic). Negative for abdominal pain, diarrhea, nausea and vomiting.  Genitourinary: Positive for urgency (urge incontinence).  Musculoskeletal: Positive for arthralgias, back pain and myalgias.  Neurological: Negative for headaches.  Psychiatric/Behavioral: Positive for sleep disturbance.  Objective:  BP 102/64    Pulse 87    Temp 98.1 F (36.7 C) (Oral)    Ht '5\' 5"'  (1.651 m)    Wt 204 lb (92.5 kg)    BMI 33.95 kg/m   BP Readings from Last 3 Encounters:  01/20/19 102/64  11/16/18 123/73  11/11/18 (!) 145/75    Wt Readings from Last 3 Encounters:  01/20/19 204 lb (92.5 kg)  11/16/18 199 lb 6 oz (90.4 kg)  10/24/18 207 lb 2  oz (94 kg)     Physical Exam Constitutional:      General: She is not in acute distress.    Appearance: She is well-developed.  HENT:     Head: Normocephalic and atraumatic.     Right Ear: External ear normal.     Left Ear: External ear normal.     Nose: Nose normal.  Eyes:     Conjunctiva/sclera: Conjunctivae normal.     Pupils: Pupils are equal, round, and reactive to light.  Neck:     Musculoskeletal: Normal range of motion and neck supple.     Thyroid: No thyromegaly.  Cardiovascular:     Rate and Rhythm: Normal rate and regular rhythm.     Heart sounds: Normal heart sounds. No murmur.  Pulmonary:     Effort: Pulmonary effort is normal. No respiratory distress.     Breath sounds: Normal breath sounds. No wheezing or rales.  Abdominal:     General: Bowel sounds are normal. There is no distension.     Palpations: Abdomen is soft.     Tenderness: There is no abdominal tenderness.  Lymphadenopathy:     Cervical: No cervical adenopathy.  Skin:    General: Skin is warm and dry.  Neurological:     Mental Status: She is alert and oriented to person, place, and time.     Deep Tendon Reflexes: Reflexes are normal and symmetric.  Psychiatric:        Behavior: Behavior normal.        Thought Content: Thought content normal.        Judgment: Judgment normal.     Diabetic Foot Exam - Simple   No data filed        Assessment & Plan:   Lauren Shannon was seen today for medical management of chronic issues.  Diagnoses and all orders for this visit:  Essential hypertension -     CBC with Differential/Platelet -     CMP14+EGFR  Mixed hyperlipidemia -     CBC with Differential/Platelet -     CMP14+EGFR -     Lipid panel  Chronic midline thoracic back pain -     CBC with Differential/Platelet -     CMP14+EGFR -     ToxASSURE Select 13 (MW), Urine -     HYDROcodone-acetaminophen (NORCO) 5-325 MG tablet; Take 1 tablet by mouth every 6 (six) hours as needed for moderate pain  or severe pain. -     HYDROcodone-acetaminophen (NORCO) 5-325 MG tablet; Take 1 tablet by mouth every 6 (six) hours as needed for moderate pain. -     HYDROcodone-acetaminophen (NORCO) 5-325 MG tablet; Take 1 tablet by mouth every 6 (six) hours as needed for moderate pain or severe pain. Take for moderate to severe pain  Hypothyroidism, unspecified type -     CBC with Differential/Platelet -     CMP14+EGFR -     TSH -     T4, Free  Need for hepatitis B  screening test -     Hepatitis B surface antibody,qualitative -     Hepatitis B surface antigen -     Hep B Core Ab W/Reflex -     CBC with Differential/Platelet -     CMP14+EGFR  Insomnia, unspecified type -     CBC with Differential/Platelet -     CMP14+EGFR -     traZODone (DESYREL) 100 MG tablet; TAKE 1/2 TO 1 TABLET AT BEDTIME  Osteoporosis, unspecified osteoporosis type, unspecified pathological fracture presence -     CBC with Differential/Platelet -     CMP14+EGFR -     risedronate (ACTONEL) 150 MG tablet; Take 1 tablet (150 mg total) by mouth every 30 (thirty) days. with water on empty stomach, nothing by mouth or lie down for next 30 minutes.  Chronic constipation -     CBC with Differential/Platelet -     CMP14+EGFR -     Plecanatide (TRULANCE) 3 MG TABS; Take 3 mg by mouth daily.  Urge incontinence -     CBC with Differential/Platelet -     CMP14+EGFR -     mirabegron ER (MYRBETRIQ) 50 MG TB24 tablet; Take 1 tablet (50 mg total) by mouth daily.  DDD (degenerative disc disease), lumbar -     CBC with Differential/Platelet -     CMP14+EGFR  Other orders -     levothyroxine (SYNTHROID) 50 MCG tablet; Take 1 tablet (50 mcg total) by mouth daily. -     pregabalin (LYRICA) 50 MG capsule; 1 qhs X7 days , then 2 qhs X 7d, then 3 qhs X 7d, then 4 qhs   I have discontinued Branden Delehanty's guaiFENesin-codeine. I have also changed her levothyroxine. Additionally, I am having her start on HYDROcodone-acetaminophen,  HYDROcodone-acetaminophen, and pregabalin. Lastly, I am having her maintain her multivitamin, aspirin, Calcium Citrate-Vitamin D (CALCIUM + D PO), ibuprofen, acetaminophen, Carboxymethylcellulose Sodium (THERATEARS OP), cetirizine, ondansetron, traZODone, risedronate, Plecanatide, mirabegron ER, and HYDROcodone-acetaminophen.  Meds ordered this encounter  Medications   traZODone (DESYREL) 100 MG tablet    Sig: TAKE 1/2 TO 1 TABLET AT BEDTIME    Dispense:  90 tablet    Refill:  1   levothyroxine (SYNTHROID) 50 MCG tablet    Sig: Take 1 tablet (50 mcg total) by mouth daily.    Dispense:  90 tablet    Refill:  1    $   risedronate (ACTONEL) 150 MG tablet    Sig: Take 1 tablet (150 mg total) by mouth every 30 (thirty) days. with water on empty stomach, nothing by mouth or lie down for next 30 minutes.    Dispense:  1 tablet    Refill:  5   Plecanatide (TRULANCE) 3 MG TABS    Sig: Take 3 mg by mouth daily.    Dispense:  90 tablet    Refill:  1   mirabegron ER (MYRBETRIQ) 50 MG TB24 tablet    Sig: Take 1 tablet (50 mg total) by mouth daily.    Dispense:  90 tablet    Refill:  1   HYDROcodone-acetaminophen (NORCO) 5-325 MG tablet    Sig: Take 1 tablet by mouth every 6 (six) hours as needed for moderate pain or severe pain.    Dispense:  60 tablet    Refill:  0   HYDROcodone-acetaminophen (NORCO) 5-325 MG tablet    Sig: Take 1 tablet by mouth every 6 (six) hours as needed for moderate pain.    Dispense:  60  tablet    Refill:  0   HYDROcodone-acetaminophen (NORCO) 5-325 MG tablet    Sig: Take 1 tablet by mouth every 6 (six) hours as needed for moderate pain or severe pain. Take for moderate to severe pain    Dispense:  60 tablet    Refill:  0   pregabalin (LYRICA) 50 MG capsule    Sig: 1 qhs X7 days , then 2 qhs X 7d, then 3 qhs X 7d, then 4 qhs    Dispense:  120 capsule    Refill:  0     Follow-up: Return in about 3 months (around 04/22/2019).  [name], M.D.

## 2019-01-21 LAB — HBCIGM: Hep B C IgM: NEGATIVE

## 2019-01-21 LAB — CMP14+EGFR
ALT: 27 IU/L (ref 0–32)
AST: 24 IU/L (ref 0–40)
Albumin/Globulin Ratio: 1.6 (ref 1.2–2.2)
Albumin: 3.9 g/dL (ref 3.8–4.8)
Alkaline Phosphatase: 76 IU/L (ref 39–117)
BUN/Creatinine Ratio: 12 (ref 12–28)
BUN: 8 mg/dL (ref 8–27)
Bilirubin Total: 0.5 mg/dL (ref 0.0–1.2)
CO2: 24 mmol/L (ref 20–29)
Calcium: 9.6 mg/dL (ref 8.7–10.3)
Chloride: 97 mmol/L (ref 96–106)
Creatinine, Ser: 0.67 mg/dL (ref 0.57–1.00)
GFR calc Af Amer: 109 mL/min/{1.73_m2} (ref >59)
GFR calc non Af Amer: 95 mL/min/{1.73_m2} (ref >59)
Globulin, Total: 2.5 g/dL (ref 1.5–4.5)
Glucose: 98 mg/dL (ref 65–99)
Potassium: 4.2 mmol/L (ref 3.5–5.2)
Sodium: 138 mmol/L (ref 134–144)
Total Protein: 6.4 g/dL (ref 6.0–8.5)

## 2019-01-21 LAB — LIPID PANEL
Chol/HDL Ratio: 3.2 ratio (ref 0.0–4.4)
Cholesterol, Total: 167 mg/dL (ref 100–199)
HDL: 53 mg/dL (ref >39)
LDL Calculated: 89 mg/dL (ref 0–99)
Triglycerides: 126 mg/dL (ref 0–149)
VLDL Cholesterol Cal: 25 mg/dL (ref 5–40)

## 2019-01-21 LAB — CBC WITH DIFFERENTIAL/PLATELET
Basophils Absolute: 0.1 10*3/uL (ref 0.0–0.2)
Basos: 1 %
EOS (ABSOLUTE): 0.1 10*3/uL (ref 0.0–0.4)
Eos: 1 %
Hematocrit: 39.1 % (ref 34.0–46.6)
Hemoglobin: 13.4 g/dL (ref 11.1–15.9)
Immature Grans (Abs): 0 10*3/uL (ref 0.0–0.1)
Immature Granulocytes: 1 %
Lymphocytes Absolute: 1.2 10*3/uL (ref 0.7–3.1)
Lymphs: 18 %
MCH: 31.3 pg (ref 26.6–33.0)
MCHC: 34.3 g/dL (ref 31.5–35.7)
MCV: 91 fL (ref 79–97)
Monocytes Absolute: 1 10*3/uL — ABNORMAL HIGH (ref 0.1–0.9)
Monocytes: 15 %
Neutrophils Absolute: 4.2 10*3/uL (ref 1.4–7.0)
Neutrophils: 64 %
Platelets: 388 10*3/uL (ref 150–450)
RBC: 4.28 x10E6/uL (ref 3.77–5.28)
RDW: 11.6 % — ABNORMAL LOW (ref 11.7–15.4)
WBC: 6.5 10*3/uL (ref 3.4–10.8)

## 2019-01-21 LAB — HEPATITIS B CORE AB W/REFLEX: Hep B Core Total Ab: POSITIVE — AB

## 2019-01-21 LAB — T4, FREE: Free T4: 1.22 ng/dL (ref 0.82–1.77)

## 2019-01-21 LAB — HEPATITIS B SURFACE ANTIBODY,QUALITATIVE: Hep B Surface Ab, Qual: REACTIVE

## 2019-01-21 LAB — TSH: TSH: 2.28 u[IU]/mL (ref 0.450–4.500)

## 2019-01-21 LAB — HEPATITIS B SURFACE ANTIGEN: Hepatitis B Surface Ag: NEGATIVE

## 2019-01-25 ENCOUNTER — Other Ambulatory Visit: Payer: Self-pay | Admitting: Family Medicine

## 2019-01-25 DIAGNOSIS — B1910 Unspecified viral hepatitis B without hepatic coma: Secondary | ICD-10-CM

## 2019-01-26 LAB — TOXASSURE SELECT 13 (MW), URINE

## 2019-02-02 ENCOUNTER — Encounter: Payer: Self-pay | Admitting: *Deleted

## 2019-02-03 ENCOUNTER — Encounter: Payer: Self-pay | Admitting: Obstetrics & Gynecology

## 2019-02-03 ENCOUNTER — Other Ambulatory Visit (HOSPITAL_COMMUNITY)
Admission: RE | Admit: 2019-02-03 | Discharge: 2019-02-03 | Disposition: A | Discharge status: Home / Self Care | Payer: BC Managed Care – PPO | Source: Ambulatory Visit | Attending: Obstetrics & Gynecology | Admitting: Obstetrics & Gynecology

## 2019-02-03 ENCOUNTER — Other Ambulatory Visit: Payer: Self-pay

## 2019-02-03 ENCOUNTER — Ambulatory Visit (INDEPENDENT_AMBULATORY_CARE_PROVIDER_SITE_OTHER): Payer: BC Managed Care – PPO | Admitting: Obstetrics & Gynecology

## 2019-02-03 VITALS — BP 109/66 | HR 90 | Ht 65.2 in | Wt 202.2 lb

## 2019-02-03 DIAGNOSIS — Z01419 Encounter for gynecological examination (general) (routine) without abnormal findings: Secondary | ICD-10-CM | POA: Diagnosis not present

## 2019-02-03 DIAGNOSIS — Z1211 Encounter for screening for malignant neoplasm of colon: Secondary | ICD-10-CM

## 2019-02-03 DIAGNOSIS — Z1212 Encounter for screening for malignant neoplasm of rectum: Secondary | ICD-10-CM

## 2019-02-03 NOTE — Progress Notes (Signed)
Subjective:     Lauren Shannon is a 63 y.o. female here for a routine exam.  No LMP recorded. Patient is postmenopausal. [id number] Birth Control Method:  menopause Menstrual Calendar(currently): amenorrhea  Current complaints: none.   Current acute medical issues:  none   Recent Gynecologic History No LMP recorded. Patient is postmenopausal. Last Pap: 2017,  normal Last mammogram: 01/2018,  normal  Past Medical History:  Diagnosis Date  . Allergy   . Arthritis    ostoarthritis  . Basal cell carcinoma   . Essential hypertension    pt states not currently being treated for this  . Headache   . History of pneumonia 1963  . Low back pain   . Mixed hyperlipidemia   . Type 2 diabetes mellitus ([place])    pt stated not currently being treated for this    Past Surgical History:  Procedure Laterality Date  . APPENDECTOMY    . CHOLECYSTECTOMY    . COLONOSCOPY N/A 01/28/2018   Procedure: COLONOSCOPY;  Surgeon: Danie Binder, MD;  Location: [facility];  Service: Endoscopy;  Laterality: N/A;  1:00  . PLANTAR FASCIA SURGERY Right 2012  . stimulation system implant  2015  . THYROID SURGERY  2005  . TONSILLECTOMY    . TUBAL LIGATION  1993  . Uterine polyp removal      OB History    Gravida  4   Para      Term      Preterm      AB  2   Living  2     SAB      TAB  2   Ectopic      Multiple      Live Births              Social History   Socioeconomic History  . Marital status: Married    Spouse name: Not on file  . Number of children: 2  . Years of education: Not on file  . Highest education level: Not on file  Occupational History  . Not on file  Social Needs  . Financial resource strain: Not on file  . Food insecurity:    Worry: Not on file    Inability: Not on file  . Transportation needs:    Medical: Not on file    Non-medical: Not on file  Tobacco Use  . Smoking status: Never Smoker  . Smokeless tobacco: Never Used  Substance and Sexual  Activity  . Alcohol use: Yes    Alcohol/week: 0.0 standard drinks    Comment: 6 per year  . Drug use: No  . Sexual activity: Not Currently    Partners: Male  Lifestyle  . Physical activity:    Days per week: Not on file    Minutes per session: Not on file  . Stress: Not on file  Relationships  . Social connections:    Talks on phone: Not on file    Gets together: Not on file    Attends religious service: Not on file    Active member of club or organization: Not on file    Attends meetings of clubs or organizations: Not on file    Relationship status: Not on file  Other Topics Concern  . Not on file  Social History Narrative  . Not on file    Family History  Problem Relation Age of Onset  . Diabetes Mother   . Hypertension Mother   . Heart disease  Father   . Heart disease Brother   . Colon cancer Neg Hx   . Colon polyps Neg Hx      Current Outpatient Medications:  .  aspirin 81 MG tablet, Take 81 mg by mouth every evening. , Disp: , Rfl:  .  Calcium Citrate-Vitamin D (CALCIUM + D PO), Take 1 tablet by mouth daily., Disp: , Rfl:  .  Carboxymethylcellulose Sodium (THERATEARS OP), Place 1 drop into both eyes daily., Disp: , Rfl:  .  cetirizine (ZYRTEC) 10 MG tablet, Take 10 mg by mouth daily., Disp: , Rfl:  .  levothyroxine (SYNTHROID) 50 MCG tablet, Take 1 tablet (50 mcg total) by mouth daily., Disp: 90 tablet, Rfl: 1 .  mirabegron ER (MYRBETRIQ) 50 MG TB24 tablet, Take 1 tablet (50 mg total) by mouth daily., Disp: 90 tablet, Rfl: 1 .  Multiple Vitamin (MULTIVITAMIN) capsule, Take 1 capsule by mouth daily., Disp: , Rfl:  .  Plecanatide (TRULANCE) 3 MG TABS, Take 3 mg by mouth daily., Disp: 90 tablet, Rfl: 1 .  pregabalin (LYRICA) 50 MG capsule, 1 qhs X7 days , then 2 qhs X 7d, then 3 qhs X 7d, then 4 qhs, Disp: 120 capsule, Rfl: 0 .  risedronate (ACTONEL) 150 MG tablet, Take 1 tablet (150 mg total) by mouth every 30 (thirty) days. with water on empty stomach, nothing by  mouth or lie down for next 30 minutes., Disp: 1 tablet, Rfl: 5 .  acetaminophen (TYLENOL) 500 MG tablet, Take 1,000 mg by mouth daily as needed for moderate pain., Disp: , Rfl:  .  [START ON 03/21/2019] HYDROcodone-acetaminophen (NORCO) 5-325 MG tablet, Take 1 tablet by mouth every 6 (six) hours as needed for moderate pain or severe pain., Disp: 60 tablet, Rfl: 0 .  [START ON 02/19/2019] HYDROcodone-acetaminophen (NORCO) 5-325 MG tablet, Take 1 tablet by mouth every 6 (six) hours as needed for moderate pain., Disp: 60 tablet, Rfl: 0 .  HYDROcodone-acetaminophen (NORCO) 5-325 MG tablet, Take 1 tablet by mouth every 6 (six) hours as needed for moderate pain or severe pain. Take for moderate to severe pain (Patient not taking: Reported on 02/03/2019), Disp: 60 tablet, Rfl: 0 .  ibuprofen (ADVIL,MOTRIN) 200 MG tablet, Take 400 mg by mouth daily as needed for headache or moderate pain., Disp: , Rfl:  .  ondansetron (ZOFRAN) 4 MG tablet, Take 1 tablet (4 mg total) by mouth every 8 (eight) hours as needed for nausea or vomiting., Disp: 20 tablet, Rfl: 0 .  traZODone (DESYREL) 100 MG tablet, TAKE 1/2 TO 1 TABLET AT BEDTIME (Patient not taking: Reported on 02/03/2019), Disp: 90 tablet, Rfl: 1  Review of Systems  Review of Systems  Constitutional: Negative for fever, chills, weight loss, malaise/fatigue and diaphoresis.  HENT: Negative for hearing loss, ear pain, nosebleeds, congestion, sore throat, neck pain, tinnitus and ear discharge.   Eyes: Negative for blurred vision, double vision, photophobia, pain, discharge and redness.  Respiratory: Negative for cough, hemoptysis, sputum production, shortness of breath, wheezing and stridor.   Cardiovascular: Negative for chest pain, palpitations, orthopnea, claudication, leg swelling and PND.  Gastrointestinal: negative for abdominal pain. Negative for heartburn, nausea, vomiting, diarrhea, constipation, blood in stool and melena.  Genitourinary: Negative for dysuria,  urgency, frequency, hematuria and flank pain.  Musculoskeletal: Negative for myalgias, back pain, joint pain and falls.  Skin: Negative for itching and rash.  Neurological: Negative for dizziness, tingling, tremors, sensory change, speech change, focal weakness, seizures, loss of consciousness, weakness and headaches.  Endo/Heme/Allergies: Negative for environmental allergies and polydipsia. Does not bruise/bleed easily.  Psychiatric/Behavioral: Negative for depression, suicidal ideas, hallucinations, memory loss and substance abuse. The patient is not nervous/anxious and does not have insomnia.        Objective:  Blood pressure 109/66, pulse 90, height 5' 5.2" (1.656 m), weight 202 lb 3.2 oz (91.7 kg).   Physical Exam  Vitals reviewed. Constitutional: She is oriented to person, place, and time. She appears well-developed and well-nourished.  HENT:  Head: Normocephalic and atraumatic.        Right Ear: External ear normal.  Left Ear: External ear normal.  Nose: Nose normal.  Mouth/Throat: Oropharynx is clear and moist.  Eyes: Conjunctivae and EOM are normal. Pupils are equal, round, and reactive to light. Right eye exhibits no discharge. Left eye exhibits no discharge. No scleral icterus.  Neck: Normal range of motion. Neck supple. No tracheal deviation present. No thyromegaly present.  Cardiovascular: Normal rate, regular rhythm, normal heart sounds and intact distal pulses.  Exam reveals no gallop and no friction rub.   No murmur heard. Respiratory: Effort normal and breath sounds normal. No respiratory distress. She has no wheezes. She has no rales. She exhibits no tenderness.  GI: Soft. Bowel sounds are normal. She exhibits no distension and no mass. There is no tenderness. There is no rebound and no guarding.  Genitourinary:  Breasts no masses skin changes or nipple changes bilaterally      Vulva is normal without lesions Vagina is pink moist without discharge Cervix normal in  appearance and pap is done Uterus is normal size shape and contour Adnexa is negative with normal sized ovaries  {Rectal    hemoccult negative, normal tone, no masses  Musculoskeletal: Normal range of motion. She exhibits no edema and no tenderness.  Neurological: She is alert and oriented to person, place, and time. She has normal reflexes. She displays normal reflexes. No cranial nerve deficit. She exhibits normal muscle tone. Coordination normal.  Skin: Skin is warm and dry. No rash noted. No erythema. No pallor.  Psychiatric: She has a normal mood and affect. Her behavior is normal. Judgment and thought content normal.       Medications Ordered at today's visit: No orders of the defined types were placed in this encounter.   Other orders placed at today's visit: No orders of the defined types were placed in this encounter.     Assessment:    Healthy female exam.    Plan:    Mammogram ordered. Follow up in: 3 years.     Return in about 3 years (around 02/02/2022) for yearly.

## 2019-02-07 LAB — CYTOLOGY - PAP
Diagnosis: NEGATIVE
HPV: NOT DETECTED

## 2019-02-11 DIAGNOSIS — Z6832 Body mass index (BMI) 32.0-32.9, adult: Secondary | ICD-10-CM | POA: Diagnosis not present

## 2019-02-11 DIAGNOSIS — M5430 Sciatica, unspecified side: Secondary | ICD-10-CM | POA: Diagnosis not present

## 2019-02-11 DIAGNOSIS — R110 Nausea: Secondary | ICD-10-CM | POA: Diagnosis not present

## 2019-02-11 DIAGNOSIS — M549 Dorsalgia, unspecified: Secondary | ICD-10-CM | POA: Diagnosis not present

## 2019-02-15 ENCOUNTER — Ambulatory Visit (HOSPITAL_COMMUNITY): Payer: BC Managed Care – PPO

## 2019-02-17 ENCOUNTER — Other Ambulatory Visit: Payer: Self-pay

## 2019-02-18 ENCOUNTER — Other Ambulatory Visit: Payer: Self-pay | Admitting: Family Medicine

## 2019-02-20 ENCOUNTER — Other Ambulatory Visit: Payer: Self-pay

## 2019-02-20 ENCOUNTER — Ambulatory Visit (INDEPENDENT_AMBULATORY_CARE_PROVIDER_SITE_OTHER): Payer: BC Managed Care – PPO

## 2019-02-20 ENCOUNTER — Ambulatory Visit (INDEPENDENT_AMBULATORY_CARE_PROVIDER_SITE_OTHER): Payer: BC Managed Care – PPO | Admitting: Family

## 2019-02-20 ENCOUNTER — Encounter: Payer: Self-pay | Admitting: Family

## 2019-02-20 VITALS — BP 119/87 | HR 93 | Temp 98.1°F | Ht 65.2 in | Wt 206.0 lb

## 2019-02-20 DIAGNOSIS — M7071 Other bursitis of hip, right hip: Secondary | ICD-10-CM

## 2019-02-20 DIAGNOSIS — M25551 Pain in right hip: Secondary | ICD-10-CM

## 2019-02-20 DIAGNOSIS — M160 Bilateral primary osteoarthritis of hip: Secondary | ICD-10-CM | POA: Diagnosis not present

## 2019-02-20 MED ORDER — DICLOFENAC SODIUM 75 MG PO TBEC: 75.0000 mg | DELAYED_RELEASE_TABLET | Freq: Two times a day (BID) | ORAL | 0 refills | Status: DC

## 2019-02-20 NOTE — Progress Notes (Signed)
Subjective:    Patient ID: Lauren Shannon, female    DOB: 10/09/1955, 63 y.o.   MRN: [id number]  Chief Complaint  Patient presents with  . right sciatica pain in hip , radiating to knee   PT presents to the office today with right hip pain that started 12 days ago. She states she went to the Urgent Care on 02/11/19 and was given Toradol and decadron injection and given flexeril and prednisone tamper prescription. She states it took a few days to get some relief, but then only lasted 1-2 days.   She reports her pain is an intermittent aching, stabbing, and shooting pain of 7 out 10. She reports if she lays flat her pain is a 0, but when she gets up to move her pain shoots up to 7-8.  Hip Pain  The incident occurred more than 1 week ago. There was no injury mechanism. The pain is present in the right hip. The quality of the pain is described as shooting and stabbing. The pain is at a severity of 7/10. The pain is moderate. The pain has been constant since onset. Pertinent negatives include no numbness or tingling. She reports no foreign bodies present. The symptoms are aggravated by weight bearing and movement. She has tried rest and NSAIDs for the symptoms. The treatment provided mild relief.      Review of Systems  Musculoskeletal: Positive for arthralgias.  Neurological: Negative for tingling and numbness.  All other systems reviewed and are negative.      Objective:   Physical Exam Vitals signs reviewed.  Constitutional:      General: She is not in acute distress.    Appearance: She is well-developed.  HENT:     Head: Normocephalic and atraumatic.  Eyes:     Pupils: Pupils are equal, round, and reactive to light.  Neck:     Musculoskeletal: Normal range of motion and neck supple.     Thyroid: No thyromegaly.  Cardiovascular:     Rate and Rhythm: Normal rate and regular rhythm.     Heart sounds: Normal heart sounds. No murmur.  Pulmonary:     Effort: Pulmonary effort is  normal. No respiratory distress.     Breath sounds: Normal breath sounds. No wheezing.  Abdominal:     General: Bowel sounds are normal. There is no distension.     Palpations: Abdomen is soft.     Tenderness: There is no abdominal tenderness.  Musculoskeletal:        General: No tenderness.     Comments: Tenderness to palpation of the greater trochanter, pain in right hip with internal and external rotation   Skin:    General: Skin is warm and dry.  Neurological:     Mental Status: She is alert and oriented to person, place, and time.     Cranial Nerves: No cranial nerve deficit.     Deep Tendon Reflexes: Reflexes are normal and symmetric.  Psychiatric:        Behavior: Behavior normal.        Thought Content: Thought content normal.        Judgment: Judgment normal.      BP 119/87   Pulse 93   Temp 98.1 F (36.7 C) (Oral)   Ht 5' 5.2" (1.656 m)   Wt 206 lb (93.4 kg)   BMI 34.07 kg/m       Assessment & Plan:  Lauren Shannon comes in today with chief complaint of right sciatica  pain in hip , radiating to knee   Diagnosis and orders addressed:  1. Right hip pain - DG HIP UNILAT W OR W/O PELVIS 2-3 VIEWS RIGHT; Future - Ambulatory referral to Orthopedic Surgery - diclofenac (VOLTAREN) 75 MG EC tablet; Take 1 tablet (75 mg total) by mouth 2 (two) times daily.  Dispense: 30 tablet; Refill: 0  2. Bursitis of right hip, unspecified bursa Rest Ice ROM exercises  Referral pending Ortho - Ambulatory referral to Orthopedic Surgery - diclofenac (VOLTAREN) 75 MG EC tablet; Take 1 tablet (75 mg total) by mouth 2 (two) times daily.  Dispense: 30 tablet; Refill: 0   Evelina Dun, FNP

## 2019-02-20 NOTE — Patient Instructions (Signed)
Hip Bursitis    Hip bursitis is inflammation of a fluid-filled sac (bursa) in the hip joint. The bursa protects the bones in the hip joint from rubbing against each other. Hip bursitis can cause mild to moderate pain, and symptoms often come and go over time.  What are the causes?  This condition may be caused by:   Injury to the hip.   Overuse of the muscles that surround the hip joint.   Arthritis or gout.   Diabetes.   Thyroid disease.   Cold weather.   Infection.  In some cases, the cause may not be known.  What are the signs or symptoms?  Symptoms of this condition may include:   Mild or moderate pain in the hip area. Pain may get worse with movement.   Tenderness and swelling of the hip, especially on the outer side of the hip.  Symptoms may come and go. If the bursa becomes infected, you may have the following symptoms:   Fever.   Red skin and a feeling of warmth in the hip area.  How is this diagnosed?  This condition may be diagnosed based on:   A physical exam.   Your medical history.   X-rays.   Removal of fluid from your inflamed bursa for testing (biopsy).  You may be sent to a health care provider who specializes in bone diseases (orthopedist) or a provider who specializes in joint inflammation (rheumatologist).  How is this treated?  This condition is treated by resting, raising (elevating), and applying pressure(compression) to the injured area. In some cases, this may be enough to make your symptoms go away. Treatment may also include:   Crutches.   Antibiotic medicine.   Draining fluid out of the bursa to help relieve swelling.   Injecting medicine that helps to reduce inflammation (cortisone).  Follow these instructions at home:  Medicines   Take over-the-counter and prescription medicines only as told by your health care provider.   Do not drive or operate heavy machinery while taking prescription pain medicine, or as told by your health care provider.   If you were  prescribed an antibiotic, take it as told by your health care provider. Do not stop taking the antibiotic even if you start to feel better.  Activity   Return to your normal activities as told by your health care provider. Ask your health care provider what activities are safe for you.   Rest and protect your hip as much as possible until your pain and swelling get better.  General instructions   Wear compression wraps only as told by your health care provider.   Elevate your hip above the level of your heart as much as you can without pain. To do this, try putting a pillow under your hips while you lie down.   Do not use your hip to support your body weight until your health care provider says that you can. Use crutches as told by your health care provider.   Gently massage and stretch your injured area as often as is comfortable.   Keep all follow-up visits as told by your health care provider. This is important.  How is this prevented?   Exercise regularly, as told by your health care provider.   Warm up and stretch before being active.   Cool down and stretch after being active.   If an activity irritates your hip or causes pain, avoid the activity as much as possible.   Avoid sitting   down for long periods at a time.  Contact a health care provider if:   You have a fever.   You develop new symptoms.   You have difficulty walking or doing everyday activities.   You have pain that gets worse or does not get better with medicine.   You develop red skin or a feeling of warmth in your hip area.  Get help right away if:   You cannot move your hip.   You have severe pain.  This information is not intended to replace advice given to you by your health care provider. Make sure you discuss any questions you have with your health care provider.  Document Released: 02/06/2002 Document Revised: 01/23/2016 Document Reviewed: 03/19/2015  Elsevier Interactive Patient Education  2019 [employer]

## 2019-03-06 DIAGNOSIS — M1611 Unilateral primary osteoarthritis, right hip: Secondary | ICD-10-CM | POA: Diagnosis not present

## 2019-03-06 DIAGNOSIS — M7061 Trochanteric bursitis, right hip: Secondary | ICD-10-CM | POA: Diagnosis not present

## 2019-03-06 DIAGNOSIS — M25551 Pain in right hip: Secondary | ICD-10-CM | POA: Diagnosis not present

## 2019-03-14 ENCOUNTER — Encounter: Payer: Self-pay | Admitting: Family Medicine

## 2019-03-22 DIAGNOSIS — M25551 Pain in right hip: Secondary | ICD-10-CM | POA: Diagnosis not present

## 2019-03-27 DIAGNOSIS — D225 Melanocytic nevi of trunk: Secondary | ICD-10-CM | POA: Diagnosis not present

## 2019-03-27 DIAGNOSIS — Z85828 Personal history of other malignant neoplasm of skin: Secondary | ICD-10-CM | POA: Diagnosis not present

## 2019-03-27 DIAGNOSIS — L920 Granuloma annulare: Secondary | ICD-10-CM | POA: Diagnosis not present

## 2019-03-27 DIAGNOSIS — Z1283 Encounter for screening for malignant neoplasm of skin: Secondary | ICD-10-CM | POA: Diagnosis not present

## 2019-04-04 DIAGNOSIS — M7061 Trochanteric bursitis, right hip: Secondary | ICD-10-CM | POA: Diagnosis not present

## 2019-04-04 DIAGNOSIS — M1611 Unilateral primary osteoarthritis, right hip: Secondary | ICD-10-CM | POA: Diagnosis not present

## 2019-04-18 ENCOUNTER — Other Ambulatory Visit: Payer: Self-pay

## 2019-04-19 ENCOUNTER — Other Ambulatory Visit: Payer: Self-pay | Admitting: Family Medicine

## 2019-04-19 ENCOUNTER — Other Ambulatory Visit: Payer: Self-pay

## 2019-04-19 ENCOUNTER — Encounter: Payer: Self-pay | Admitting: Family Medicine

## 2019-04-19 ENCOUNTER — Ambulatory Visit: Payer: BLUE CROSS/BLUE SHIELD | Admitting: Family Medicine

## 2019-04-19 VITALS — BP 96/64 | HR 74 | Temp 98.2°F | Ht 65.0 in | Wt 204.0 lb

## 2019-04-19 DIAGNOSIS — A084 Viral intestinal infection, unspecified: Secondary | ICD-10-CM

## 2019-04-19 DIAGNOSIS — M810 Age-related osteoporosis without current pathological fracture: Secondary | ICD-10-CM

## 2019-04-19 DIAGNOSIS — M546 Pain in thoracic spine: Secondary | ICD-10-CM

## 2019-04-19 DIAGNOSIS — N3941 Urge incontinence: Secondary | ICD-10-CM

## 2019-04-19 DIAGNOSIS — I10 Essential (primary) hypertension: Secondary | ICD-10-CM | POA: Diagnosis not present

## 2019-04-19 DIAGNOSIS — E039 Hypothyroidism, unspecified: Secondary | ICD-10-CM | POA: Diagnosis not present

## 2019-04-19 DIAGNOSIS — G8929 Other chronic pain: Secondary | ICD-10-CM

## 2019-04-19 DIAGNOSIS — K5909 Other constipation: Secondary | ICD-10-CM

## 2019-04-19 DIAGNOSIS — E782 Mixed hyperlipidemia: Secondary | ICD-10-CM | POA: Diagnosis not present

## 2019-04-19 DIAGNOSIS — G4700 Insomnia, unspecified: Secondary | ICD-10-CM

## 2019-04-19 DIAGNOSIS — E119 Type 2 diabetes mellitus without complications: Secondary | ICD-10-CM | POA: Diagnosis not present

## 2019-04-19 LAB — BAYER DCA HB A1C WAIVED: HB A1C (BAYER DCA - WAIVED): 5.7 % (ref <7.0)

## 2019-04-19 MED ORDER — MIRABEGRON ER 50 MG PO TB24: 50.0000 mg | ORAL_TABLET | Freq: Every day | ORAL | 1 refills | Status: DC

## 2019-04-19 MED ORDER — TRAZODONE HCL 300 MG PO TABS: ORAL_TABLET | ORAL | 1 refills | Status: DC

## 2019-04-19 MED ORDER — PREGABALIN 200 MG PO CAPS: 200.0000 mg | ORAL_CAPSULE | Freq: Every day | ORAL | 1 refills | Status: DC

## 2019-04-19 MED ORDER — LEVOTHYROXINE SODIUM 50 MCG PO TABS: 50.0000 ug | ORAL_TABLET | Freq: Every day | ORAL | 1 refills | Status: DC

## 2019-04-19 MED ORDER — RISEDRONATE SODIUM 150 MG PO TABS: 150.0000 mg | ORAL_TABLET | ORAL | 5 refills | Status: DC

## 2019-04-19 MED ORDER — TRULANCE 3 MG PO TABS: 3.0000 mg | ORAL_TABLET | Freq: Every day | ORAL | 1 refills | Status: DC

## 2019-04-19 NOTE — Progress Notes (Signed)
Subjective:  Patient ID: Lauren Shannon,  female    DOB: 1955/11/04  Age: 63 y.o.    CC: Medical Management of Chronic Issues   HPI Lauren Shannon presents for  follow-up of hypertension. Patient has no history of headache chest pain or shortness of breath or recent cough. Patient also denies symptoms of TIA such as numbness weakness lateralizing. Patient denies side effects from medication. States taking it regularly.  Patient also  in for follow-up of elevated cholesterol. Doing well without complaints on current medication. Denies side effects  including myalgia and arthralgia and nausea. Also in today for liver function testing. Currently no chest pain, shortness of breath or other cardiovascular related symptoms noted.  Follow-up of diabetes. Patient does not check blood sugar at home.  Patient denies symptoms such as excessive hunger or urinary frequency, excessive hunger, nausea No significant hypoglycemic spells noted. Medications reviewed. Pt reports taking them regularly. Pt. denies complication/adverse reaction today.   Patient having much less pain. She is no longer taking opiates.   History Lauren Shannon has a past medical history of Allergy, Arthritis, Basal cell carcinoma, Essential hypertension, Headache, History of pneumonia (1963), Low back pain, Mixed hyperlipidemia, Routine gynecological examination (08/22/2013), and Type 2 diabetes mellitus ([place]).   She has a past surgical history that includes Thyroid surgery (2005); Uterine polyp removal; Tubal ligation (1993); Cholecystectomy; Appendectomy; Tonsillectomy; Plantar fascia surgery (Right, 2012); stimulation system implant (2015); and Colonoscopy (N/A, 01/28/2018).   Her family history includes Diabetes in her mother; Heart disease in her brother and father; Hypertension in her mother.She reports that she has never smoked. She has never used smokeless tobacco. She reports current alcohol use. She reports that she does not use  drugs.  Current Outpatient Medications on File Prior to Visit  Medication Sig Dispense Refill  . acetaminophen (TYLENOL) 500 MG tablet Take 1,000 mg by mouth daily as needed for moderate pain.    Marland Kitchen aspirin 81 MG tablet Take 81 mg by mouth every evening.     . Calcium Citrate-Vitamin D (CALCIUM + D PO) Take 1 tablet by mouth daily.    . cetirizine (ZYRTEC) 10 MG tablet Take 10 mg by mouth daily.    . diclofenac (VOLTAREN) 75 MG EC tablet Take 1 tablet (75 mg total) by mouth 2 (two) times daily. 30 tablet 0  . Multiple Vitamin (MULTIVITAMIN) capsule Take 1 capsule by mouth daily.    . ondansetron (ZOFRAN) 4 MG tablet Take 1 tablet (4 mg total) by mouth every 8 (eight) hours as needed for nausea or vomiting. 20 tablet 0  . HYDROcodone-acetaminophen (NORCO) 5-325 MG tablet Take 1 tablet by mouth every 6 (six) hours as needed for moderate pain or severe pain. (Patient not taking: Reported on 04/19/2019) 60 tablet 0  . HYDROcodone-acetaminophen (NORCO) 5-325 MG tablet Take 1 tablet by mouth every 6 (six) hours as needed for moderate pain. (Patient not taking: Reported on 04/19/2019) 60 tablet 0  . HYDROcodone-acetaminophen (NORCO) 5-325 MG tablet Take 1 tablet by mouth every 6 (six) hours as needed for moderate pain or severe pain. Take for moderate to severe pain (Patient not taking: Reported on 04/19/2019) 60 tablet 0   No current facility-administered medications on file prior to visit.     ROS Review of Systems  Constitutional: Negative.   HENT: Negative for congestion.   Eyes: Negative for visual disturbance.  Respiratory: Negative for shortness of breath.   Cardiovascular: Negative for chest pain.  Gastrointestinal: Negative for abdominal pain,  constipation, diarrhea, nausea and vomiting.  Genitourinary: Negative for difficulty urinating.  Musculoskeletal: Negative for arthralgias and myalgias.  Neurological: Negative for headaches.  Psychiatric/Behavioral: Positive for sleep disturbance.     Objective:  BP 96/64   Pulse 74   Temp 98.2 F (36.8 C) (Oral)   Ht '5\' 5"'  (1.651 m)   Wt 204 lb (92.5 kg)   BMI 33.95 kg/m   BP Readings from Last 3 Encounters:  04/19/19 96/64  02/20/19 119/87  02/03/19 109/66    Wt Readings from Last 3 Encounters:  04/19/19 204 lb (92.5 kg)  02/20/19 206 lb (93.4 kg)  02/03/19 202 lb 3.2 oz (91.7 kg)     Physical Exam  Diabetic Foot Exam - Simple   No data filed        Assessment & Plan:   Lauren Shannon was seen today for medical management of chronic issues.  Diagnoses and all orders for this visit:  Type 2 diabetes mellitus without complication, without long-term current use of insulin (HCC) -     Bayer DCA Hb A1c Waived  Mixed hyperlipidemia -     Lipid panel  Essential hypertension -     CBC with Differential/Platelet -     CMP14+EGFR  Chronic midline thoracic back pain  Hypothyroidism, unspecified type -     Thyroid Panel With TSH -     T4, Free  Insomnia, unspecified type -     traZODone (DESYREL) 300 MG tablet; TAKE 1/2 TO 1 TABLET AT BEDTIME for sleep  Osteoporosis, unspecified osteoporosis type, unspecified pathological fracture presence -     risedronate (ACTONEL) 150 MG tablet; Take 1 tablet (150 mg total) by mouth every 30 (thirty) days. with water on empty stomach, nothing by mouth or lie down for next 30 minutes.  Chronic constipation -     Plecanatide (TRULANCE) 3 MG TABS; Take 3 mg by mouth daily.  Viral gastroenteritis  Urge incontinence -     mirabegron ER (MYRBETRIQ) 50 MG TB24 tablet; Take 1 tablet (50 mg total) by mouth daily.  Other orders -     levothyroxine (SYNTHROID) 50 MCG tablet; Take 1 tablet (50 mcg total) by mouth daily. -     pregabalin (LYRICA) 200 MG capsule; Take 1 capsule (200 mg total) by mouth at bedtime.   I have discontinued Corinna Prezioso's Carboxymethylcellulose Sodium (THERATEARS OP), cyclobenzaprine, and M-Vit. I have also changed her pregabalin and trazodone.  Additionally, I am having her maintain her multivitamin, aspirin, Calcium Citrate-Vitamin D (CALCIUM + D PO), acetaminophen, cetirizine, ondansetron, HYDROcodone-acetaminophen, HYDROcodone-acetaminophen, HYDROcodone-acetaminophen, diclofenac, risedronate, Trulance, levothyroxine, and mirabegron ER.  Meds ordered this encounter  Medications  . risedronate (ACTONEL) 150 MG tablet    Sig: Take 1 tablet (150 mg total) by mouth every 30 (thirty) days. with water on empty stomach, nothing by mouth or lie down for next 30 minutes.    Dispense:  1 tablet    Refill:  5  . Plecanatide (TRULANCE) 3 MG TABS    Sig: Take 3 mg by mouth daily.    Dispense:  90 tablet    Refill:  1  . levothyroxine (SYNTHROID) 50 MCG tablet    Sig: Take 1 tablet (50 mcg total) by mouth daily.    Dispense:  90 tablet    Refill:  1    $  . mirabegron ER (MYRBETRIQ) 50 MG TB24 tablet    Sig: Take 1 tablet (50 mg total) by mouth daily.    Dispense:  90  tablet    Refill:  1  . pregabalin (LYRICA) 200 MG capsule    Sig: Take 1 capsule (200 mg total) by mouth at bedtime.    Dispense:  90 capsule    Refill:  1  . traZODone (DESYREL) 300 MG tablet    Sig: TAKE 1/2 TO 1 TABLET AT BEDTIME for sleep    Dispense:  90 tablet    Refill:  1     Follow-up: Return in about 3 months (around 07/20/2019).  Lauren Shannon, M.D.

## 2019-04-20 ENCOUNTER — Encounter: Payer: Self-pay | Admitting: Family Medicine

## 2019-04-20 LAB — T4, FREE: Free T4: 1.17 ng/dL (ref 0.82–1.77)

## 2019-04-20 LAB — CBC WITH DIFFERENTIAL/PLATELET
Basophils Absolute: 0 10*3/uL (ref 0.0–0.2)
Basos: 1 %
EOS (ABSOLUTE): 0.1 10*3/uL (ref 0.0–0.4)
Eos: 1 %
Hematocrit: 42.3 % (ref 34.0–46.6)
Hemoglobin: 14.3 g/dL (ref 11.1–15.9)
Immature Grans (Abs): 0 10*3/uL (ref 0.0–0.1)
Immature Granulocytes: 0 %
Lymphocytes Absolute: 1.8 10*3/uL (ref 0.7–3.1)
Lymphs: 37 %
MCH: 30.3 pg (ref 26.6–33.0)
MCHC: 33.8 g/dL (ref 31.5–35.7)
MCV: 90 fL (ref 79–97)
Monocytes Absolute: 0.5 10*3/uL (ref 0.1–0.9)
Monocytes: 10 %
Neutrophils Absolute: 2.4 10*3/uL (ref 1.4–7.0)
Neutrophils: 51 %
Platelets: 225 10*3/uL (ref 150–450)
RBC: 4.72 x10E6/uL (ref 3.77–5.28)
RDW: 11.7 % (ref 11.7–15.4)
WBC: 4.7 10*3/uL (ref 3.4–10.8)

## 2019-04-20 LAB — CMP14+EGFR
ALT: 12 IU/L (ref 0–32)
AST: 15 IU/L (ref 0–40)
Albumin/Globulin Ratio: 1.9 (ref 1.2–2.2)
Albumin: 4.3 g/dL (ref 3.8–4.8)
Alkaline Phosphatase: 41 IU/L (ref 39–117)
BUN/Creatinine Ratio: 32 — ABNORMAL HIGH (ref 12–28)
BUN: 19 mg/dL (ref 8–27)
Bilirubin Total: 0.6 mg/dL (ref 0.0–1.2)
CO2: 27 mmol/L (ref 20–29)
Calcium: 9 mg/dL (ref 8.7–10.3)
Chloride: 99 mmol/L (ref 96–106)
Creatinine, Ser: 0.6 mg/dL (ref 0.57–1.00)
GFR calc Af Amer: 113 mL/min/{1.73_m2} (ref >59)
GFR calc non Af Amer: 98 mL/min/{1.73_m2} (ref >59)
Globulin, Total: 2.3 g/dL (ref 1.5–4.5)
Glucose: 96 mg/dL (ref 65–99)
Potassium: 4.1 mmol/L (ref 3.5–5.2)
Sodium: 139 mmol/L (ref 134–144)
Total Protein: 6.6 g/dL (ref 6.0–8.5)

## 2019-04-20 LAB — THYROID PANEL WITH TSH
Free Thyroxine Index: 1.8 (ref 1.2–4.9)
T3 Uptake Ratio: 28 % (ref 24–39)
T4, Total: 6.5 ug/dL (ref 4.5–12.0)
TSH: 1.31 u[IU]/mL (ref 0.450–4.500)

## 2019-04-20 LAB — LIPID PANEL
Chol/HDL Ratio: 2.9 ratio (ref 0.0–4.4)
Cholesterol, Total: 224 mg/dL — ABNORMAL HIGH (ref 100–199)
HDL: 76 mg/dL (ref >39)
LDL Calculated: 124 mg/dL — ABNORMAL HIGH (ref 0–99)
Triglycerides: 119 mg/dL (ref 0–149)
VLDL Cholesterol Cal: 24 mg/dL (ref 5–40)

## 2019-04-24 ENCOUNTER — Other Ambulatory Visit: Payer: Self-pay | Admitting: Family Medicine

## 2019-04-24 MED ORDER — PHENTERMINE HCL 37.5 MG PO CAPS: 37.5000 mg | ORAL_CAPSULE | ORAL | 2 refills | Status: DC

## 2019-04-28 ENCOUNTER — Other Ambulatory Visit: Payer: Self-pay

## 2019-04-28 ENCOUNTER — Telehealth: Payer: Self-pay | Admitting: Family Medicine

## 2019-05-01 ENCOUNTER — Other Ambulatory Visit: Payer: Self-pay

## 2019-05-01 ENCOUNTER — Ambulatory Visit (INDEPENDENT_AMBULATORY_CARE_PROVIDER_SITE_OTHER): Payer: BC Managed Care – PPO | Admitting: *Deleted

## 2019-05-01 DIAGNOSIS — Z23 Encounter for immunization: Secondary | ICD-10-CM

## 2019-05-30 DIAGNOSIS — Z1231 Encounter for screening mammogram for malignant neoplasm of breast: Secondary | ICD-10-CM | POA: Diagnosis not present

## 2019-06-16 ENCOUNTER — Encounter: Payer: Self-pay | Admitting: *Deleted

## 2019-06-20 ENCOUNTER — Other Ambulatory Visit: Payer: Self-pay | Admitting: Family Medicine

## 2019-07-24 ENCOUNTER — Other Ambulatory Visit: Payer: Self-pay

## 2019-07-25 ENCOUNTER — Ambulatory Visit: Payer: BC Managed Care – PPO | Admitting: Family Medicine

## 2019-07-25 ENCOUNTER — Encounter: Payer: Self-pay | Admitting: Family Medicine

## 2019-07-25 VITALS — BP 124/80 | HR 89 | Temp 98.0°F | Ht 65.0 in | Wt 197.0 lb

## 2019-07-25 DIAGNOSIS — E782 Mixed hyperlipidemia: Secondary | ICD-10-CM | POA: Diagnosis not present

## 2019-07-25 DIAGNOSIS — E119 Type 2 diabetes mellitus without complications: Secondary | ICD-10-CM

## 2019-07-25 DIAGNOSIS — K5904 Chronic idiopathic constipation: Secondary | ICD-10-CM | POA: Diagnosis not present

## 2019-07-25 DIAGNOSIS — E039 Hypothyroidism, unspecified: Secondary | ICD-10-CM

## 2019-07-25 LAB — BAYER DCA HB A1C WAIVED: HB A1C (BAYER DCA - WAIVED): 5.7 % (ref <7.0)

## 2019-07-25 NOTE — Progress Notes (Signed)
Subjective:  Patient ID: Lauren Shannon,  female    DOB: 1956-01-01  Age: 63 y.o.    CC: Medical Management of Chronic Issues   HPI [name] presents for  follow-up of hypertension. Patient has no history of headache chest pain or shortness of breath or recent cough. Patient also denies symptoms of TIA such as numbness weakness lateralizing. Patient denies side effects from medication. States taking it regularly.  Patient also  in for follow-up of elevated cholesterol. Doing well without complaints on current medication. Denies side effects  including myalgia and arthralgia and nausea. Also in today for liver function testing. Currently no chest pain, shortness of breath or other cardiovascular related symptoms noted.  Follow-up of diabetes. Patient does not check blood sugar at home. Patient denies symptoms such as excessive hunger or urinary frequency, excessive hunger, nausea No significant hypoglycemic spells noted. Medications reviewed. Pt reports taking them regularly. Pt. denies complication/adverse reaction today.   Continues to have back pain, but relief is good with Lyrica and an occasional OTC ibuprofen.She no longer takes hydrocodone or any opiate.   She does have trouble sleeping. Two or three nights aweek she will awaken at 2 AM and be awake for hours. She takes 1/2 of trazodone due to the grogginess the next fday if she takes the whole thing.   follow-up on  thyroid. The patient has a history of hypothyroidism for many years. It has been stable recently. Pt. denies any change in  voice, loss of hair, but does have intermittent cold intolerance. Energy level has been adequate to good. Patient denies constipation and diarrhea. No myxedema. Medication is as noted below. Verified that pt is taking it daily on an empty stomach. Well tolerated.    History Denna has a past medical history of Allergy, Arthritis, Basal cell carcinoma, Essential hypertension, Headache, History of  pneumonia (1963), Low back pain, Mixed hyperlipidemia, Routine gynecological examination (08/22/2013), and Type 2 diabetes mellitus ([place]).   She has a past surgical history that includes Thyroid surgery (2005); Uterine polyp removal; Tubal ligation (1993); Cholecystectomy; Appendectomy; Tonsillectomy; Plantar fascia surgery (Right, 2012); stimulation system implant (2015); and Colonoscopy (N/A, 01/28/2018).   Her family history includes Diabetes in her mother; Heart disease in her brother and father; Hypertension in her mother.She reports that she has never smoked. She has never used smokeless tobacco. She reports current alcohol use. She reports that she does not use drugs.  Current Outpatient Medications on File Prior to Visit  Medication Sig Dispense Refill  . aspirin 81 MG tablet Take 81 mg by mouth every evening.     . Calcium Citrate-Vitamin D (CALCIUM + D PO) Take 1 tablet by mouth daily.    Marland Kitchen levothyroxine (SYNTHROID) 50 MCG tablet Take 1 tablet (50 mcg total) by mouth daily. 90 tablet 1  . mirabegron ER (MYRBETRIQ) 50 MG TB24 tablet Take 1 tablet (50 mg total) by mouth daily. 90 tablet 1  . Multiple Vitamin (MULTIVITAMIN) capsule Take 1 capsule by mouth daily.    . phentermine 37.5 MG capsule Take 1 capsule (37.5 mg total) by mouth every morning. 30 capsule 2  . Plecanatide (TRULANCE) 3 MG TABS Take 3 mg by mouth daily. 90 tablet 1  . pregabalin (LYRICA) 200 MG capsule Take 1 capsule (200 mg total) by mouth at bedtime. 90 capsule 1  . risedronate (ACTONEL) 150 MG tablet Take 1 tablet (150 mg total) by mouth every 30 (thirty) days. with water on empty stomach, nothing by  mouth or lie down for next 30 minutes. 1 tablet 5  . traZODone (DESYREL) 300 MG tablet TAKE 1/2 TO 1 TABLET AT BEDTIME for sleep 90 tablet 1   No current facility-administered medications on file prior to visit.     ROS Review of Systems  Constitutional: Negative.   HENT: Negative for congestion.   Eyes: Negative for  visual disturbance.  Respiratory: Negative for shortness of breath.   Cardiovascular: Negative for chest pain.  Gastrointestinal: Positive for constipation. Negative for abdominal pain, diarrhea, nausea and vomiting.  Endocrine: Positive for cold intolerance.  Genitourinary: Negative for difficulty urinating.  Musculoskeletal: Positive for back pain. Negative for arthralgias and myalgias.  Neurological: Negative for headaches.  Psychiatric/Behavioral: Positive for sleep disturbance.    Objective:  BP 124/80   Pulse 89   Temp 98 F (36.7 C) (Temporal)   Ht 5\' 5"  (1.651 m)   Wt 197 lb (89.4 kg)   SpO2 98%   BMI 32.78 kg/m   BP Readings from Last 3 Encounters:  07/25/19 124/80  04/19/19 96/64  02/20/19 119/87    Wt Readings from Last 3 Encounters:  07/25/19 197 lb (89.4 kg)  04/19/19 204 lb (92.5 kg)  02/20/19 206 lb (93.4 kg)     Physical Exam  Diabetic Foot Exam - Simple   No data filed        Assessment & Plan:   Moxie was seen today for medical management of chronic issues.  Diagnoses and all orders for this visit:  Type 2 diabetes mellitus without complication, without long-term current use of insulin (HCC) -     hgba1c -     Microalbumin / creatinine urine ratio -     CBC -     CMP  Chronic idiopathic constipation -     CBC -     CMP  Mixed hyperlipidemia -     CBC -     CMP -     Lipid  Hypothyroidism, unspecified type -     TSH + free T4 -     CBC -     CMP   I have discontinued Pranathi Bundren's acetaminophen, cetirizine, ondansetron, HYDROcodone-acetaminophen, HYDROcodone-acetaminophen, HYDROcodone-acetaminophen, and diclofenac. I am also having her maintain her multivitamin, aspirin, Calcium Citrate-Vitamin D (CALCIUM + D PO), risedronate, Trulance, levothyroxine, mirabegron ER, pregabalin, trazodone, and phentermine.  No orders of the defined types were placed in this encounter.    Follow-up: Return in about 6 months (around  01/22/2020).  [name], M.D.

## 2019-07-25 NOTE — Patient Instructions (Addendum)
Use miralax for constipation 1-2 capfuls daily     Back Exercises The following exercises strengthen the muscles that help to support the trunk and back. They also help to keep the lower back flexible. Doing these exercises can help to prevent back pain or lessen existing pain.  If you have back pain or discomfort, try doing these exercises 2-3 times each day or as told by your health care provider.  As your pain improves, do them once each day, but increase the number of times that you repeat the steps for each exercise (do more repetitions).  To prevent the recurrence of back pain, continue to do these exercises once each day or as told by your health care provider. Do exercises exactly as told by your health care provider and adjust them as directed. It is normal to feel mild stretching, pulling, tightness, or discomfort as you do these exercises, but you should stop right away if you feel sudden pain or your pain gets worse. Exercises Single knee to chest Repeat these steps 3-5 times for each leg: 1. Lie on your back on a firm bed or the floor with your legs extended. 2. Bring one knee to your chest. Your other leg should stay extended and in contact with the floor. 3. Hold your knee in place by grabbing your knee or thigh with both hands and hold. 4. Pull on your knee until you feel a gentle stretch in your lower back or buttocks. 5. Hold the stretch for 10-30 seconds. 6. Slowly release and straighten your leg. Pelvic tilt Repeat these steps 5-10 times: 1. Lie on your back on a firm bed or the floor with your legs extended. 2. Bend your knees so they are pointing toward the ceiling and your feet are flat on the floor. 3. Tighten your lower abdominal muscles to press your lower back against the floor. This motion will tilt your pelvis so your tailbone points up toward the ceiling instead of pointing to your feet or the floor. 4. With gentle tension and even breathing, hold this  position for 5-10 seconds. Cat-cow Repeat these steps until your lower back becomes more flexible: 1. Get into a hands-and-knees position on a firm surface. Keep your hands under your shoulders, and keep your knees under your hips. You may place padding under your knees for comfort. 2. Let your head hang down toward your chest. Contract your abdominal muscles and point your tailbone toward the floor so your lower back becomes rounded like the back of a cat. 3. Hold this position for 5 seconds. 4. Slowly lift your head, let your abdominal muscles relax and point your tailbone up toward the ceiling so your back forms a sagging arch like the back of a cow. 5. Hold this position for 5 seconds.  Press-ups Repeat these steps 5-10 times: 1. Lie on your abdomen (face-down) on the floor. 2. Place your palms near your head, about shoulder-width apart. 3. Keeping your back as relaxed as possible and keeping your hips on the floor, slowly straighten your arms to raise the top half of your body and lift your shoulders. Do not use your back muscles to raise your upper torso. You may adjust the placement of your hands to make yourself more comfortable. 4. Hold this position for 5 seconds while you keep your back relaxed. 5. Slowly return to lying flat on the floor.  Bridges Repeat these steps 10 times: 1. Lie on your back on a firm surface. 2.  Bend your knees so they are pointing toward the ceiling and your feet are flat on the floor. Your arms should be flat at your sides, next to your body. 3. Tighten your buttocks muscles and lift your buttocks off the floor until your waist is at almost the same height as your knees. You should feel the muscles working in your buttocks and the back of your thighs. If you do not feel these muscles, slide your feet 1-2 inches farther away from your buttocks. 4. Hold this position for 3-5 seconds. 5. Slowly lower your hips to the starting position, and allow your buttocks  muscles to relax completely. If this exercise is too easy, try doing it with your arms crossed over your chest. Abdominal crunches Repeat these steps 5-10 times: 1. Lie on your back on a firm bed or the floor with your legs extended. 2. Bend your knees so they are pointing toward the ceiling and your feet are flat on the floor. 3. Cross your arms over your chest. 4. Tip your chin slightly toward your chest without bending your neck. 5. Tighten your abdominal muscles and slowly raise your trunk (torso) high enough to lift your shoulder blades a tiny bit off the floor. Avoid raising your torso higher than that because it can put too much stress on your low back and does not help to strengthen your abdominal muscles. 6. Slowly return to your starting position. Back lifts Repeat these steps 5-10 times: 1. Lie on your abdomen (face-down) with your arms at your sides, and rest your forehead on the floor. 2. Tighten the muscles in your legs and your buttocks. 3. Slowly lift your chest off the floor while you keep your hips pressed to the floor. Keep the back of your head in line with the curve in your back. Your eyes should be looking at the floor. 4. Hold this position for 3-5 seconds. 5. Slowly return to your starting position. Contact a health care provider if:  Your back pain or discomfort gets much worse when you do an exercise.  Your worsening back pain or discomfort does not lessen within 2 hours after you exercise. If you have any of these problems, stop doing these exercises right away. Do not do them again unless your health care provider says that you can. Get help right away if:  You develop sudden, severe back pain. If this happens, stop doing the exercises right away. Do not do them again unless your health care provider says that you can. This information is not intended to replace advice given to you by your health care provider. Make sure you discuss any questions you have with  your health care provider. Document Released: 09/24/2004 Document Revised: 12/22/2018 Document Reviewed: 05/19/2018 Elsevier Patient Education  2020 [employer].

## 2019-07-26 ENCOUNTER — Other Ambulatory Visit: Payer: Self-pay | Admitting: *Deleted

## 2019-07-26 LAB — CMP14+EGFR
ALT: 15 IU/L (ref 0–32)
AST: 16 IU/L (ref 0–40)
Albumin/Globulin Ratio: 1.6 (ref 1.2–2.2)
Albumin: 4.2 g/dL (ref 3.8–4.8)
Alkaline Phosphatase: 51 IU/L (ref 39–117)
BUN/Creatinine Ratio: 16 (ref 12–28)
BUN: 12 mg/dL (ref 8–27)
Bilirubin Total: 0.5 mg/dL (ref 0.0–1.2)
CO2: 25 mmol/L (ref 20–29)
Calcium: 10 mg/dL (ref 8.7–10.3)
Chloride: 103 mmol/L (ref 96–106)
Creatinine, Ser: 0.74 mg/dL (ref 0.57–1.00)
GFR calc Af Amer: 100 mL/min/{1.73_m2} (ref >59)
GFR calc non Af Amer: 87 mL/min/{1.73_m2} (ref >59)
Globulin, Total: 2.6 g/dL (ref 1.5–4.5)
Glucose: 97 mg/dL (ref 65–99)
Potassium: 4.3 mmol/L (ref 3.5–5.2)
Sodium: 141 mmol/L (ref 134–144)
Total Protein: 6.8 g/dL (ref 6.0–8.5)

## 2019-07-26 LAB — CBC WITH DIFFERENTIAL/PLATELET
Basophils Absolute: 0 10*3/uL (ref 0.0–0.2)
Basos: 0 %
EOS (ABSOLUTE): 0.1 10*3/uL (ref 0.0–0.4)
Eos: 1 %
Hematocrit: 44.1 % (ref 34.0–46.6)
Hemoglobin: 14.6 g/dL (ref 11.1–15.9)
Immature Grans (Abs): 0 10*3/uL (ref 0.0–0.1)
Immature Granulocytes: 0 %
Lymphocytes Absolute: 1.9 10*3/uL (ref 0.7–3.1)
Lymphs: 39 %
MCH: 31.5 pg (ref 26.6–33.0)
MCHC: 33.1 g/dL (ref 31.5–35.7)
MCV: 95 fL (ref 79–97)
Monocytes Absolute: 0.5 10*3/uL (ref 0.1–0.9)
Monocytes: 10 %
Neutrophils Absolute: 2.4 10*3/uL (ref 1.4–7.0)
Neutrophils: 50 %
Platelets: 285 10*3/uL (ref 150–450)
RBC: 4.64 x10E6/uL (ref 3.77–5.28)
RDW: 11.8 % (ref 11.7–15.4)
WBC: 4.9 10*3/uL (ref 3.4–10.8)

## 2019-07-26 LAB — LIPID PANEL
Chol/HDL Ratio: 3.1 ratio (ref 0.0–4.4)
Cholesterol, Total: 208 mg/dL — ABNORMAL HIGH (ref 100–199)
HDL: 67 mg/dL
LDL Chol Calc (NIH): 119 mg/dL — ABNORMAL HIGH (ref 0–99)
Triglycerides: 127 mg/dL (ref 0–149)
VLDL Cholesterol Cal: 22 mg/dL (ref 5–40)

## 2019-07-26 LAB — MICROALBUMIN / CREATININE URINE RATIO
Creatinine, Urine: 97.3 mg/dL
Microalb/Creat Ratio: 21 mg/g creat (ref 0–29)
Microalbumin, Urine: 20.5 ug/mL

## 2019-07-26 LAB — TSH+FREE T4
Free T4: 1.17 ng/dL (ref 0.82–1.77)
TSH: 2.54 u[IU]/mL (ref 0.450–4.500)

## 2019-07-26 MED ORDER — PHENTERMINE HCL 37.5 MG PO CAPS: 37.5000 mg | ORAL_CAPSULE | ORAL | 2 refills | Status: DC

## 2019-07-26 MED ORDER — ATORVASTATIN CALCIUM 40 MG PO TABS: 40.0000 mg | ORAL_TABLET | Freq: Every day | ORAL | 1 refills | Status: DC

## 2019-07-26 NOTE — Progress Notes (Signed)
I sent in the requested prescription 

## 2019-07-28 ENCOUNTER — Ambulatory Visit: Payer: BC Managed Care – PPO | Admitting: Family Medicine

## 2019-08-04 DIAGNOSIS — R350 Frequency of micturition: Secondary | ICD-10-CM | POA: Diagnosis not present

## 2019-08-04 DIAGNOSIS — R351 Nocturia: Secondary | ICD-10-CM | POA: Diagnosis not present

## 2019-10-19 ENCOUNTER — Other Ambulatory Visit: Payer: Self-pay | Admitting: Family Medicine

## 2019-10-19 NOTE — Telephone Encounter (Signed)
Last office visit 07/25/19 with instructions to follow up in 6 months.

## 2019-11-09 ENCOUNTER — Other Ambulatory Visit: Payer: Self-pay | Admitting: Family Medicine

## 2019-11-09 DIAGNOSIS — G4700 Insomnia, unspecified: Secondary | ICD-10-CM

## 2019-11-09 NOTE — Telephone Encounter (Signed)
Last office visit 07/25/2019  Last refill: Lyrica  04/19/19, #90, 1 refill Trazodone 04/19/19, #90, 1 refill

## 2019-11-16 ENCOUNTER — Other Ambulatory Visit: Payer: Self-pay | Admitting: Family Medicine

## 2019-11-16 NOTE — Telephone Encounter (Signed)
Last office visit 07/25/2019 Last refill 10/19/2019, #30, no refills

## 2020-01-08 ENCOUNTER — Telehealth: Payer: Self-pay | Admitting: Family Medicine

## 2020-01-08 DIAGNOSIS — E782 Mixed hyperlipidemia: Secondary | ICD-10-CM

## 2020-01-08 DIAGNOSIS — I10 Essential (primary) hypertension: Secondary | ICD-10-CM

## 2020-01-08 DIAGNOSIS — E119 Type 2 diabetes mellitus without complications: Secondary | ICD-10-CM

## 2020-01-08 DIAGNOSIS — E039 Hypothyroidism, unspecified: Secondary | ICD-10-CM

## 2020-01-08 NOTE — Telephone Encounter (Signed)
Labs ordered.

## 2020-01-12 ENCOUNTER — Other Ambulatory Visit: Payer: Self-pay

## 2020-01-12 ENCOUNTER — Other Ambulatory Visit: Payer: BC Managed Care – PPO

## 2020-01-12 DIAGNOSIS — I10 Essential (primary) hypertension: Secondary | ICD-10-CM | POA: Diagnosis not present

## 2020-01-12 DIAGNOSIS — E119 Type 2 diabetes mellitus without complications: Secondary | ICD-10-CM | POA: Diagnosis not present

## 2020-01-12 DIAGNOSIS — E039 Hypothyroidism, unspecified: Secondary | ICD-10-CM

## 2020-01-12 DIAGNOSIS — E782 Mixed hyperlipidemia: Secondary | ICD-10-CM

## 2020-01-12 DIAGNOSIS — B1910 Unspecified viral hepatitis B without hepatic coma: Secondary | ICD-10-CM

## 2020-01-12 LAB — BAYER DCA HB A1C WAIVED: HB A1C (BAYER DCA - WAIVED): 5.6 % (ref <7.0)

## 2020-01-13 ENCOUNTER — Other Ambulatory Visit: Payer: Self-pay | Admitting: Family Medicine

## 2020-01-13 DIAGNOSIS — M810 Age-related osteoporosis without current pathological fracture: Secondary | ICD-10-CM

## 2020-01-13 LAB — TSH: TSH: 2.71 u[IU]/mL (ref 0.450–4.500)

## 2020-01-13 LAB — CBC WITH DIFFERENTIAL/PLATELET
Basophils Absolute: 0 10*3/uL (ref 0.0–0.2)
Basos: 1 %
EOS (ABSOLUTE): 0.1 10*3/uL (ref 0.0–0.4)
Eos: 1 %
Hematocrit: 43.7 % (ref 34.0–46.6)
Hemoglobin: 14.3 g/dL (ref 11.1–15.9)
Immature Grans (Abs): 0 10*3/uL (ref 0.0–0.1)
Immature Granulocytes: 0 %
Lymphocytes Absolute: 1.5 10*3/uL (ref 0.7–3.1)
Lymphs: 30 %
MCH: 31.4 pg (ref 26.6–33.0)
MCHC: 32.7 g/dL (ref 31.5–35.7)
MCV: 96 fL (ref 79–97)
Monocytes Absolute: 0.4 10*3/uL (ref 0.1–0.9)
Monocytes: 8 %
Neutrophils Absolute: 3 10*3/uL (ref 1.4–7.0)
Neutrophils: 60 %
Platelets: 210 10*3/uL (ref 150–450)
RBC: 4.55 x10E6/uL (ref 3.77–5.28)
RDW: 11.1 % — ABNORMAL LOW (ref 11.7–15.4)
WBC: 5 10*3/uL (ref 3.4–10.8)

## 2020-01-13 LAB — CMP14+EGFR
ALT: 20 IU/L (ref 0–32)
AST: 20 IU/L (ref 0–40)
Albumin/Globulin Ratio: 1.8 (ref 1.2–2.2)
Albumin: 4.3 g/dL (ref 3.8–4.8)
Alkaline Phosphatase: 53 IU/L (ref 39–117)
BUN/Creatinine Ratio: 20 (ref 12–28)
BUN: 17 mg/dL (ref 8–27)
Bilirubin Total: 0.7 mg/dL (ref 0.0–1.2)
CO2: 24 mmol/L (ref 20–29)
Calcium: 9.2 mg/dL (ref 8.7–10.3)
Chloride: 103 mmol/L (ref 96–106)
Creatinine, Ser: 0.87 mg/dL (ref 0.57–1.00)
GFR calc Af Amer: 82 mL/min/{1.73_m2} (ref >59)
GFR calc non Af Amer: 71 mL/min/{1.73_m2} (ref >59)
Globulin, Total: 2.4 g/dL (ref 1.5–4.5)
Glucose: 149 mg/dL — ABNORMAL HIGH (ref 65–99)
Potassium: 4.2 mmol/L (ref 3.5–5.2)
Sodium: 139 mmol/L (ref 134–144)
Total Protein: 6.7 g/dL (ref 6.0–8.5)

## 2020-01-13 LAB — HEPATITIS B CORE ANTIBODY, IGM: Hep B C IgM: NEGATIVE

## 2020-01-13 LAB — LIPID PANEL
Chol/HDL Ratio: 2.1 ratio (ref 0.0–4.4)
Cholesterol, Total: 149 mg/dL (ref 100–199)
HDL: 70 mg/dL (ref >39)
LDL Chol Calc (NIH): 63 mg/dL (ref 0–99)
Triglycerides: 87 mg/dL (ref 0–149)
VLDL Cholesterol Cal: 16 mg/dL (ref 5–40)

## 2020-01-15 NOTE — Progress Notes (Signed)
Hello Shelbie,  Your lab result is normal and/or stable.Some minor variations that are not significant are commonly marked abnormal, but do not represent any medical problem for you.  Best regards, Melodee Lupe, M.D.

## 2020-01-22 ENCOUNTER — Ambulatory Visit: Payer: BC Managed Care – PPO | Admitting: Family Medicine

## 2020-01-24 ENCOUNTER — Other Ambulatory Visit: Payer: Self-pay

## 2020-01-24 ENCOUNTER — Encounter: Payer: Self-pay | Admitting: Family Medicine

## 2020-01-24 ENCOUNTER — Ambulatory Visit: Payer: BC Managed Care – PPO | Admitting: Family Medicine

## 2020-01-24 VITALS — BP 138/84 | HR 91 | Temp 97.4°F | Resp 20 | Ht 65.0 in | Wt 191.0 lb

## 2020-01-24 DIAGNOSIS — E1169 Type 2 diabetes mellitus with other specified complication: Secondary | ICD-10-CM

## 2020-01-24 DIAGNOSIS — R300 Dysuria: Secondary | ICD-10-CM | POA: Diagnosis not present

## 2020-01-24 DIAGNOSIS — E756 Lipid storage disorder, unspecified: Secondary | ICD-10-CM

## 2020-01-24 LAB — URINALYSIS, COMPLETE
Bilirubin, UA: NEGATIVE
Glucose, UA: NEGATIVE
Ketones, UA: NEGATIVE
Nitrite, UA: POSITIVE — AB
Protein,UA: NEGATIVE
Specific Gravity, UA: 1.015 (ref 1.005–1.030)
Urobilinogen, Ur: 0.2 mg/dL (ref 0.2–1.0)
pH, UA: 6.5 (ref 5.0–7.5)

## 2020-01-24 LAB — MICROSCOPIC EXAMINATION
Renal Epithel, UA: NONE SEEN /hpf
WBC, UA: >30 /hpf — AB (ref 0–5)

## 2020-01-24 MED ORDER — BLOOD GLUCOSE MONITOR KIT: PACK | 0 refills | Status: DC

## 2020-01-24 MED ORDER — AMOXICILLIN 500 MG PO CAPS: 500.0000 mg | ORAL_CAPSULE | Freq: Three times a day (TID) | ORAL | 0 refills | Status: DC

## 2020-01-24 NOTE — Progress Notes (Signed)
Subjective:  Patient ID: Lauren Shannon, female    DOB: June 01, 1956  Age: 64 y.o. MRN: [id number]  CC: Medical Management of Chronic Issues and Urinary Tract Infection   HPI Lauren Shannon presents forFollow-up of diabetes. Patient checks blood sugar at home.  Readings log not returned.  Patient says they are doing okay.  A1c is 5.6 several days before this visit.  Reported below. Patient denies symptoms such as polyuria, polydipsia, excessive hunger, nausea No significant hypoglycemic spells noted. Medications reviewed. Pt reports taking them regularly without complication/adverse reaction being reported today.  Checking feet daily.   burning with urination and frequency for several days. Denies fever . No flank pain. No nausea, vomiting.  New mattress has allowed her to sleep better. Tapering off of the trazodone. Now at 1/2 qhs   History Lauren Shannon has a past medical history of Allergy, Arthritis, Basal cell carcinoma, Essential hypertension, Headache, History of pneumonia (1963), Low back pain, Mixed hyperlipidemia, Routine gynecological examination (08/22/2013), and Type 2 diabetes mellitus ([name]).   She has a past surgical history that includes Thyroid surgery (2005); Uterine polyp removal; Tubal ligation (1993); Cholecystectomy; Appendectomy; Tonsillectomy; Plantar fascia surgery (Right, 2012); stimulation system implant (2015); and Colonoscopy (N/A, 01/28/2018).   Her family history includes Diabetes in her mother; Heart disease in her brother and father; Hypertension in her mother.She reports that she has never smoked. She has never used smokeless tobacco. She reports current alcohol use. She reports that she does not use drugs.  Current Outpatient Medications on File Prior to Visit  Medication Sig Dispense Refill  . aspirin 81 MG tablet Take 81 mg by mouth every evening.     Marland Kitchen atorvastatin (LIPITOR) 40 MG tablet TAKE 1 TABLET DAILY 90 tablet 0  . Calcium Citrate-Vitamin D (CALCIUM + D  PO) Take 1 tablet by mouth daily.    Marland Kitchen levothyroxine (SYNTHROID) 50 MCG tablet Take 1 tablet (50 mcg total) by mouth daily. 90 tablet 1  . mirabegron ER (MYRBETRIQ) 50 MG TB24 tablet Take 1 tablet (50 mg total) by mouth daily. 90 tablet 1  . Multiple Vitamin (MULTIVITAMIN) capsule Take 1 capsule by mouth daily.    Marland Kitchen Plecanatide (TRULANCE) 3 MG TABS Take 3 mg by mouth daily. 90 tablet 1  . pregabalin (LYRICA) 200 MG capsule TAKE ONE TABLET AT BEDTIME 90 capsule 0  . risedronate (ACTONEL) 150 MG tablet TAKE 1 TABLET ONCE A MONTH ON AN EMPTY STOMACH 1 tablet 0  . traZODone (DESYREL) 150 MG tablet TAKE 1 TO 2 TABLETS AT BEDTIME FOR SLEEP 180 tablet 0   No current facility-administered medications on file prior to visit.    ROS Review of Systems  Constitutional: Negative.   HENT: Negative for congestion.   Eyes: Negative for visual disturbance.  Respiratory: Negative for shortness of breath.   Cardiovascular: Negative for chest pain.  Gastrointestinal: Negative for abdominal pain, constipation, diarrhea, nausea and vomiting.  Genitourinary: Positive for difficulty urinating, dysuria (pressure over bladder) and flank pain (mild). Negative for frequency.  Musculoskeletal: Positive for arthralgias. Negative for myalgias.  Neurological: Negative for headaches.  Psychiatric/Behavioral: Negative for sleep disturbance.    Objective:  BP 138/84   Pulse 91   Temp (!) 97.4 F (36.3 C) (Temporal)   Resp 20   Ht _0  (1.651 m)   Wt 191 lb (86.6 kg)   SpO2 98%   BMI 31.78 kg/m   BP Readings from Last 3 Encounters:  01/24/20 138/84  07/25/19 124/80  04/19/19  96/64    Wt Readings from Last 3 Encounters:  01/24/20 191 lb (86.6 kg)  07/25/19 197 lb (89.4 kg)  04/19/19 204 lb (92.5 kg)     Physical Exam Constitutional:      General: She is not in acute distress.    Appearance: She is well-developed.  HENT:     Head: Normocephalic and atraumatic.  Neck:     Thyroid: No thyromegaly.   Cardiovascular:     Rate and Rhythm: Normal rate and regular rhythm.     Heart sounds: Normal heart sounds. No murmur.  Pulmonary:     Effort: Pulmonary effort is normal. No respiratory distress.     Breath sounds: Normal breath sounds. No wheezing or rales.  Abdominal:     Palpations: Abdomen is soft.     Tenderness: There is no abdominal tenderness.  Musculoskeletal:        General: Normal range of motion.     Cervical back: Normal range of motion and neck supple.  Lymphadenopathy:     Cervical: No cervical adenopathy.  Skin:    General: Skin is warm and dry.  Neurological:     Mental Status: She is alert and oriented to person, place, and time.  Psychiatric:        Behavior: Behavior normal.        Thought Content: Thought content normal.        Judgment: Judgment normal.     Results for orders placed or performed in visit on 01/24/20  Microscopic Examination   URINE  Result Value Ref Range   WBC, UA >30 (A) 0 - 5 /hpf   RBC 3-10 (A) 0 - 2 /hpf   Epithelial Cells (non renal) 0-10 0 - 10 /hpf   Renal Epithel, UA None seen None seen /hpf   Bacteria, UA Many (A) None seen/Few   Yeast, UA Present None seen  Urinalysis, Complete  Result Value Ref Range   Specific Gravity, UA 1.015 1.005 - 1.030   pH, UA 6.5 5.0 - 7.5   Color, UA Yellow Yellow   Appearance Ur Hazy (A) Clear   Leukocytes,UA Trace (A) Negative   Protein,UA Negative Negative/Trace   Glucose, UA Negative Negative   Ketones, UA Negative Negative   RBC, UA 1+ (A) Negative   Bilirubin, UA Negative Negative   Urobilinogen, Ur 0.2 0.2 - 1.0 mg/dL   Nitrite, UA Positive (A) Negative   Microscopic Examination See below:      Assessment & Plan:   Elleanna was seen today for medical management of chronic issues and urinary tract infection.  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  Use up to four times daily as directed. (FOR ICD-10 : E11.9  Dysuria -     Urinalysis, Complete -     amoxicillin (AMOXIL) 500 MG capsule; Take 1 capsule (500 mg total) by mouth 3 (three) times daily. -     Microscopic Examination      I have discontinued Danaria Dillingham's phentermine. I am also having her start on blood glucose meter kit and supplies and amoxicillin. Additionally, I am having her maintain her multivitamin, aspirin, Calcium Citrate-Vitamin D (CALCIUM + D PO), Trulance, levothyroxine, mirabegron ER, pregabalin, traZODone, risedronate, and atorvastatin.  Meds ordered this encounter  Medications  . blood glucose meter kit and supplies KIT    Sig: Dispense  based on patient and insurance preference. Use up to four times daily as directed. (FOR ICD-10 : E11.9    Dispense:  1 each    Refill:  0    Order Specific Question:   Number of strips    Answer:   100    Order Specific Question:   Number of lancets    Answer:   100  . amoxicillin (AMOXIL) 500 MG capsule    Sig: Take 1 capsule (500 mg total) by mouth 3 (three) times daily.    Dispense:  21 capsule    Refill:  0     Follow-up: No follow-ups on file.  [name], M.D.

## 2020-01-26 ENCOUNTER — Encounter: Payer: Self-pay | Admitting: Family Medicine

## 2020-02-07 ENCOUNTER — Other Ambulatory Visit: Payer: Self-pay | Admitting: Family Medicine

## 2020-02-07 DIAGNOSIS — M810 Age-related osteoporosis without current pathological fracture: Secondary | ICD-10-CM

## 2020-02-07 DIAGNOSIS — K5909 Other constipation: Secondary | ICD-10-CM

## 2020-02-29 ENCOUNTER — Encounter: Payer: Self-pay | Admitting: Family

## 2020-02-29 ENCOUNTER — Other Ambulatory Visit: Payer: Self-pay

## 2020-02-29 ENCOUNTER — Ambulatory Visit: Payer: BC Managed Care – PPO | Admitting: Family

## 2020-02-29 VITALS — BP 110/69 | HR 100 | Temp 98.0°F | Ht 65.0 in | Wt 194.8 lb

## 2020-02-29 DIAGNOSIS — R300 Dysuria: Secondary | ICD-10-CM

## 2020-02-29 DIAGNOSIS — R399 Unspecified symptoms and signs involving the genitourinary system: Secondary | ICD-10-CM | POA: Diagnosis not present

## 2020-02-29 LAB — MICROSCOPIC EXAMINATION
Renal Epithel, UA: NONE SEEN /hpf
WBC, UA: >30 /hpf — AB (ref 0–5)

## 2020-02-29 LAB — URINALYSIS, COMPLETE
Bilirubin, UA: NEGATIVE
Glucose, UA: NEGATIVE
Ketones, UA: NEGATIVE
Nitrite, UA: NEGATIVE
Specific Gravity, UA: 1.02 (ref 1.005–1.030)
Urobilinogen, Ur: 0.2 mg/dL (ref 0.2–1.0)
pH, UA: 6.5 (ref 5.0–7.5)

## 2020-02-29 MED ORDER — CEPHALEXIN 500 MG PO CAPS: 500.0000 mg | ORAL_CAPSULE | Freq: Two times a day (BID) | ORAL | 0 refills | Status: DC

## 2020-02-29 NOTE — Progress Notes (Signed)
   Subjective:    Patient ID: Lauren Shannon, female    DOB: 04/23/1956, 64 y.o.   MRN: [id number]  Chief Complaint  Patient presents with  . pelvic pressure    cant empty bladder   . Urinary Urgency    Urinary Frequency  This is a new problem. The current episode started in the past 7 days. The problem occurs every urination. The problem has been unchanged. The quality of the pain is described as aching. The pain is at a severity of 8/10. The pain is mild. There has been no fever. Associated symptoms include frequency. She has tried increased fluids for the symptoms. The treatment provided mild relief.      Review of Systems  Genitourinary: Positive for frequency.  All other systems reviewed and are negative.      Objective:   Physical Exam Vitals reviewed.  Constitutional:      General: She is not in acute distress.    Appearance: She is well-developed.  HENT:     Head: Normocephalic and atraumatic.  Eyes:     Pupils: Pupils are equal, round, and reactive to light.  Neck:     Thyroid: No thyromegaly.  Cardiovascular:     Rate and Rhythm: Normal rate and regular rhythm.     Heart sounds: Normal heart sounds. No murmur heard.   Pulmonary:     Effort: Pulmonary effort is normal. No respiratory distress.     Breath sounds: Normal breath sounds. No wheezing.  Abdominal:     General: Bowel sounds are normal. There is no distension.     Palpations: Abdomen is soft.     Tenderness: There is no abdominal tenderness.  Musculoskeletal:        General: No tenderness. Normal range of motion.     Cervical back: Normal range of motion and neck supple.     Right lower leg: Edema (trace) present.     Left lower leg: Edema (trace) present.  Skin:    General: Skin is warm and dry.  Neurological:     Mental Status: She is alert and oriented to person, place, and time.     Cranial Nerves: No cranial nerve deficit.     Deep Tendon Reflexes: Reflexes are normal and symmetric.    Psychiatric:        Behavior: Behavior normal.        Thought Content: Thought content normal.        Judgment: Judgment normal.     BP 110/69   Pulse 100   Temp 98 F (36.7 C) (Temporal)   Ht 5\' 5"  (1.651 m)   Wt 194 lb 12.8 oz (88.4 kg)   BMI 32.42 kg/m        Assessment & Plan:  Lauren Shannon comes in today with chief complaint of pelvic pressure (cant empty bladder ) and Urinary Urgency   Diagnosis and orders addressed:  1. Dysuria - Urinalysis, Complete - Urine Culture  2. UTI symptoms PT will bring in urine sample Force fluids AZO over the counter X2 days RTO if symptoms worsen or do not improve  Culture pending - cephALEXin (KEFLEX) 500 MG capsule; Take 1 capsule (500 mg total) by mouth 2 (two) times daily.  Dispense: 14 capsule; Refill: 0   Evelina Dun, FNP

## 2020-02-29 NOTE — Patient Instructions (Signed)

## 2020-03-02 LAB — URINE CULTURE

## 2020-04-12 ENCOUNTER — Other Ambulatory Visit: Payer: Self-pay | Admitting: Family Medicine

## 2020-04-12 DIAGNOSIS — M810 Age-related osteoporosis without current pathological fracture: Secondary | ICD-10-CM

## 2020-04-29 ENCOUNTER — Encounter: Payer: Self-pay | Admitting: Family Medicine

## 2020-05-07 ENCOUNTER — Other Ambulatory Visit: Payer: Self-pay | Admitting: Family Medicine

## 2020-05-07 ENCOUNTER — Ambulatory Visit: Payer: BC Managed Care – PPO | Admitting: Family Medicine

## 2020-05-07 ENCOUNTER — Encounter: Payer: Self-pay | Admitting: Family Medicine

## 2020-05-07 ENCOUNTER — Other Ambulatory Visit: Payer: Self-pay

## 2020-05-07 VITALS — BP 120/76 | HR 73 | Temp 97.5°F | Ht 65.0 in | Wt 199.4 lb

## 2020-05-07 DIAGNOSIS — N3090 Cystitis, unspecified without hematuria: Secondary | ICD-10-CM | POA: Diagnosis not present

## 2020-05-07 DIAGNOSIS — G4700 Insomnia, unspecified: Secondary | ICD-10-CM

## 2020-05-07 DIAGNOSIS — R3915 Urgency of urination: Secondary | ICD-10-CM | POA: Diagnosis not present

## 2020-05-07 LAB — URINALYSIS, COMPLETE
Bilirubin, UA: NEGATIVE
Glucose, UA: NEGATIVE
Ketones, UA: NEGATIVE
Nitrite, UA: NEGATIVE
Specific Gravity, UA: 1.025 (ref 1.005–1.030)
Urobilinogen, Ur: 0.2 mg/dL (ref 0.2–1.0)
pH, UA: 6 (ref 5.0–7.5)

## 2020-05-07 LAB — MICROSCOPIC EXAMINATION: WBC, UA: >30 /hpf — AB (ref 0–5)

## 2020-05-07 MED ORDER — SULFAMETHOXAZOLE-TRIMETHOPRIM 800-160 MG PO TABS: 1.0000 | ORAL_TABLET | Freq: Two times a day (BID) | ORAL | 0 refills | Status: DC

## 2020-05-07 NOTE — Progress Notes (Signed)
Chief Complaint  Patient presents with  . Urinary Frequency  . Urinary Urgency    HPI  Patient presents today for burning with urination and frequency for several days. Denies fever . No flank pain. No nausea, vomiting.   PMH: Smoking status noted ROS: Per HPI  Objective: Ht 5\' 5"  (1.651 m)   Wt 199 lb 6.4 oz (90.4 kg)   BMI 33.18 kg/m  Gen: NAD, alert, cooperative with exam HEENT: NCAT, EOMI, PERRL CV: RRR, good S1/S2, no murmur Resp: CTABL, no wheezes, non-labored Abd: SNTND, BS present, no guarding or organomegaly Ext: No edema, warm Neuro: Alert and oriented, No gross deficits  Assessment and plan:  1. Urgency of urination   2. Cystitis     Meds ordered this encounter  Medications  . sulfamethoxazole-trimethoprim (BACTRIM DS) 800-160 MG tablet    Sig: Take 1 tablet by mouth 2 (two) times daily.    Dispense:  14 tablet    Refill:  0    Orders Placed This Encounter  Procedures  . Urine Culture    Standing Status:   Future    Standing Expiration Date:   06/06/2020  . Urinalysis, Complete    Follow up as needed.  [name], MD

## 2020-05-09 LAB — URINE CULTURE

## 2020-05-10 ENCOUNTER — Encounter: Payer: Self-pay | Admitting: Family Medicine

## 2020-06-04 ENCOUNTER — Other Ambulatory Visit (HOSPITAL_COMMUNITY): Payer: Self-pay | Admitting: Obstetrics & Gynecology

## 2020-06-04 DIAGNOSIS — Z1231 Encounter for screening mammogram for malignant neoplasm of breast: Secondary | ICD-10-CM

## 2020-06-05 ENCOUNTER — Ambulatory Visit (INDEPENDENT_AMBULATORY_CARE_PROVIDER_SITE_OTHER): Payer: BC Managed Care – PPO

## 2020-06-05 ENCOUNTER — Ambulatory Visit: Payer: BC Managed Care – PPO | Admitting: Family Medicine

## 2020-06-05 ENCOUNTER — Encounter: Payer: Self-pay | Admitting: Family Medicine

## 2020-06-05 ENCOUNTER — Other Ambulatory Visit: Payer: Self-pay

## 2020-06-05 VITALS — BP 112/81 | HR 80 | Temp 98.0°F | Ht 65.0 in | Wt 201.0 lb

## 2020-06-05 DIAGNOSIS — M546 Pain in thoracic spine: Secondary | ICD-10-CM | POA: Diagnosis not present

## 2020-06-05 DIAGNOSIS — M25551 Pain in right hip: Secondary | ICD-10-CM | POA: Diagnosis not present

## 2020-06-05 DIAGNOSIS — M545 Low back pain, unspecified: Secondary | ICD-10-CM | POA: Diagnosis not present

## 2020-06-05 DIAGNOSIS — R296 Repeated falls: Secondary | ICD-10-CM | POA: Diagnosis not present

## 2020-06-05 DIAGNOSIS — R2689 Other abnormalities of gait and mobility: Secondary | ICD-10-CM

## 2020-06-05 NOTE — Progress Notes (Signed)
BP 112/81   Pulse 80   Temp 98 F (36.7 C)   Ht '5\' 5"'  (1.651 m)   Wt 201 lb (91.2 kg)   SpO2 100%   BMI 33.45 kg/m    Subjective:   Patient ID: Lauren Shannon, female    DOB: 1955-09-23, 64 y.o.   MRN: [id number]  HPI: Lauren Shannon is a 64 y.o. female presenting on 06/05/2020 for Fall (feels that balance is off), Back Pain (entire back), and Hip Pain (right)   HPI Patient presents today due to recurrent falls and balance issues. Patient states that her first fall was in late August. She tripped on the curb and fell on outstretched hands. She did not have any immediate pain following this fall but states that several weeks later, began to develop right hip and leg pain. She had another fall last weekend in which she states she was walking along the side of a truck and tripped, and states her right side was "slammed against the truck". She denies hitting her head or loss of consciousness during either of these episodes.   Patient states she has been having constant, 5/10 pain in the back, right hip and leg. She states the pain in her right hip radiates down to her knee and she describes it as a dull ache. The back pain is from the mid thoracic region to the lumbar region and is a sharp pain. Both are alleviated by tylenol and ibuprofen. Patient does have a history of osteoporosis (on risedronate), spondylolisthesis, and mild scoliosis, as well as family history of osteoporosis and fractures.  Patient endorses some recent difficulty with balance when ambulating around her home. She notes that she sometimes needs to hold onto counters to steady herself. She states sometimes her balance issues are accompanied by dizziness. She denies chest pain, palpitations, dyspnea, headache, facial weakness, slurred speech, or syncope.   Patient also notes recent episode of left arm "tingling" which occurred several days ago. Per patient, she has had these episodes previously but this was more severe. The  sensation came on at rest and lasted 1 min. She denies chest pain, diaphoresis, dyspnea, palpitations at the time of the episodes. She has been seen by cardiology in the past for PVC's and had an echo and stress test done at this time.  Patient does have a cardiologist that she seen in the past and will go see them  Relevant past medical, surgical, family and social history reviewed and updated as indicated. Interim medical history since our last visit reviewed. Allergies and medications reviewed and updated.  Review of Systems  Constitutional: Negative for diaphoresis and fatigue.  Respiratory: Negative for shortness of breath.   Cardiovascular: Negative for chest pain, palpitations and leg swelling.  Musculoskeletal: Positive for arthralgias, back pain, gait problem and myalgias. Negative for joint swelling.  Neurological: Positive for dizziness. Negative for syncope, speech difficulty, weakness, light-headedness and headaches.    Per HPI unless specifically indicated above   Allergies as of 06/05/2020      Reactions   Oxybutynin Shortness Of Breath, Palpitations      Medication List       Accurate as of June 05, 2020 12:45 PM. If you have any questions, ask your nurse or doctor.        STOP taking these medications   sulfamethoxazole-trimethoprim 800-160 MG tablet Commonly known as: BACTRIM DS Stopped by: Worthy Rancher, MD   Trulance 3 MG Tabs Generic drug: Plecanatide Stopped by: Vonna Kotyk  A Dettinger, MD     TAKE these medications   aspirin 81 MG tablet Take 81 mg by mouth every evening.   atorvastatin 40 MG tablet Commonly known as: LIPITOR TAKE 1 TABLET DAILY   blood glucose meter kit and supplies Kit Dispense based on patient and insurance preference. Use up to four times daily as directed. (FOR ICD-10 : E11.9   CALCIUM + D PO Take 1 tablet by mouth daily.   levothyroxine 50 MCG tablet Commonly known as: SYNTHROID TAKE 1 TABLET DAILY   mirabegron ER  50 MG Tb24 tablet Commonly known as: Myrbetriq Take 1 tablet (50 mg total) by mouth daily.   multivitamin capsule Take 1 capsule by mouth daily.   pregabalin 200 MG capsule Commonly known as: LYRICA TAKE ONE TABLET AT BEDTIME   risedronate 150 MG tablet Commonly known as: ACTONEL TAKE 1 TABLET ONCE A MONTH ON AN EMPTY STOMACH   traZODone 150 MG tablet Commonly known as: DESYREL TAKE 1 TO 2 TABLETS AT BEDTIME FOR SLEEP        Objective:   BP 112/81   Pulse 80   Temp 98 F (36.7 C)   Ht '5\' 5"'  (1.651 m)   Wt 201 lb (91.2 kg)   SpO2 100%   BMI 33.45 kg/m   Wt Readings from Last 3 Encounters:  06/05/20 201 lb (91.2 kg)  05/07/20 199 lb 6.4 oz (90.4 kg)  02/29/20 194 lb 12.8 oz (88.4 kg)    Physical Exam Constitutional:      Appearance: Normal appearance. She is not ill-appearing.  HENT:     Head: Normocephalic and atraumatic.  Eyes:     Extraocular Movements: Extraocular movements intact.     Conjunctiva/sclera: Conjunctivae normal.     Pupils: Pupils are equal, round, and reactive to light.  Cardiovascular:     Rate and Rhythm: Normal rate and regular rhythm.     Pulses: Normal pulses.     Heart sounds: Normal heart sounds.  Pulmonary:     Effort: Pulmonary effort is normal.     Breath sounds: Normal breath sounds.  Musculoskeletal:        General: Tenderness present. No swelling or deformity.     Cervical back: Normal range of motion and neck supple.     Right lower leg: No edema.     Left lower leg: No edema.     Comments: No bruising or skin changes.Tenderness to palpation from thoracic to lumbar region of spine and over right external hip. Pain with passive and active ROM of hip and flexion/extension of back. Strength and sensation intact.  Skin:    General: Skin is warm and dry.     Findings: No bruising.  Neurological:     General: No focal deficit present.     Mental Status: She is alert and oriented to person, place, and time.     Sensory: No  sensory deficit.     Motor: No weakness.     Gait: Gait abnormal.     Comments: Patient had some difficulty with heel-toe walk but was able to complete. No pronator drift, negative romberg, neurological exam otherwise unremarkable.   Pain on the hip is on the right lateral aspect, known anterior posterior  Hip and spinal x-rays, await final read from radiology  Assessment & Plan:   Problem List Items Addressed This Visit    None    Visit Diagnoses    Balance disorder    -  Primary  Relevant Orders   Ambulatory referral to Physical Therapy   Recurrent falls       Relevant Orders   Ambulatory referral to Physical Therapy   Acute right-sided low back pain without sciatica       Relevant Orders   DG Thoracic Spine 2 View   Right hip pain       Relevant Orders   DG HIP UNILAT W OR W/O PELVIS 2-3 VIEWS RIGHT      will do physical therapy referral and then await final read from radiology on x-rays.  Instructed patient not to go to physical therapy until she is heard back from [place] on the x-rays. Follow up plan: Return if symptoms worsen or fail to improve.  Back and hip pain most likely musculoskeletal; will obtain X-rays to assess for possible fractures due to patients history of osteoporosis. Recommended patient continue ibuprofen and ice/heat for pain. Patient had unremarkable neuro exam balance issues most likely due to physical deconditioning. Referred patient to PT for strengthening.   Recommended patient follow up with cardiology regarding episodes of left arm pain.   Counseling provided for all of the vaccine components Orders Placed This Encounter  Procedures  . DG Thoracic Spine 2 View  . DG HIP UNILAT W OR W/O PELVIS 2-3 VIEWS RIGHT  . Ambulatory referral to Physical Therapy   patient seen and examined with Luella Cook, PA student, agree with assessment and plan above.  Will await read from x-rays and send patient to physical therapy for balance training and exercise to  prevent falls, likely from deconditioning. Caryl Pina, MD [facility] 06/05/2020, 12:45 PM

## 2020-06-06 ENCOUNTER — Encounter: Payer: Self-pay | Admitting: Family Medicine

## 2020-06-10 ENCOUNTER — Ambulatory Visit (HOSPITAL_COMMUNITY)
Admission: RE | Admit: 2020-06-10 | Discharge: 2020-06-10 | Disposition: A | Discharge status: Home / Self Care | Payer: BC Managed Care – PPO | Source: Ambulatory Visit | Attending: Obstetrics & Gynecology | Admitting: Obstetrics & Gynecology

## 2020-06-10 ENCOUNTER — Other Ambulatory Visit: Payer: Self-pay

## 2020-06-10 DIAGNOSIS — Z1231 Encounter for screening mammogram for malignant neoplasm of breast: Secondary | ICD-10-CM | POA: Insufficient documentation

## 2020-06-21 ENCOUNTER — Encounter: Payer: Self-pay | Admitting: Physical Therapy

## 2020-06-21 ENCOUNTER — Ambulatory Visit: Payer: BC Managed Care – PPO | Attending: Family Medicine | Admitting: Physical Therapy

## 2020-06-21 ENCOUNTER — Other Ambulatory Visit: Payer: Self-pay

## 2020-06-21 DIAGNOSIS — R2681 Unsteadiness on feet: Secondary | ICD-10-CM | POA: Insufficient documentation

## 2020-06-21 DIAGNOSIS — R296 Repeated falls: Secondary | ICD-10-CM

## 2020-06-21 DIAGNOSIS — M6281 Muscle weakness (generalized): Secondary | ICD-10-CM | POA: Diagnosis not present

## 2020-06-21 NOTE — Therapy (Signed)
[facility] [place], [place], [address] Phone: [phone]   Fax:  [phone]  Physical Therapy Evaluation  Patient Details  Name: Lauren Shannon MRN: [id number] Date of Birth: [birth year] Referring Provider (PT): Caryl Pina, MD   Encounter Date: 06/21/2020   PT End of Session - 06/21/20 0911    Visit Number 1    Number of Visits 12    Date for PT Re-Evaluation 08/09/20    PT Start Time 0815    PT Stop Time 0858    PT Time Calculation (min) 43 min    Activity Tolerance Patient tolerated treatment well    Behavior During Therapy [facility] for tasks assessed/performed           Past Medical History:  Diagnosis Date  . Allergy   . Arthritis    ostoarthritis  . Basal cell carcinoma   . Essential hypertension    pt states not currently being treated for this  . Headache   . History of pneumonia 1963  . Low back pain   . Mixed hyperlipidemia   . Routine gynecological examination 08/22/2013  . Type 2 diabetes mellitus ([place])    pt stated not currently being treated for this    Past Surgical History:  Procedure Laterality Date  . APPENDECTOMY    . CHOLECYSTECTOMY    . COLONOSCOPY N/A 01/28/2018   Procedure: COLONOSCOPY;  Surgeon: Danie Binder, MD;  [facility];  Service: Endoscopy;  Laterality: N/A;  1:00  . PLANTAR FASCIA SURGERY Right 2012  . stimulation system implant  2015  . THYROID SURGERY  2005  . TONSILLECTOMY    . TUBAL LIGATION  1993  . Uterine polyp removal      There were no vitals filed for this visit.    Subjective Assessment - 06/21/20 1143    Subjective COVID-19 screening performed upon arrival. Patient arrives to physical therapy with reports of two falls and decreased balance. Patient reports her first fall required assistance to standing. Patient reports since her fall she has had an increase in back pain. Patient reports ability to perform ADLs independently but requires  some assistance from her husband for home activities. Patient's thoracic pain on average is 4/10. Patient's goals are to decrease pain, improve movement, improve ability to perform home activities, and improve balance.    Pertinent History DM, OA; OP; DDD. interstim bladder implanted device (by left glutes and R psis)    Limitations Walking;House hold activities    Diagnostic tests x-ray: normal    Patient Stated Goals improve balance, reduce falls    Currently in Pain? Yes    Pain Score 3     Pain Location Back    Pain Orientation Mid    Pain Descriptors / Indicators Sharp    Pain Type Chronic pain    Pain Onset More than a month ago    Pain Frequency Constant              [facility] PT Assessment - 06/21/20 0001      Assessment   Medical Diagnosis Balance Disorder, Recurrent falls    Referring Provider (PT) Caryl Pina, MD    Onset Date/Surgical Date --   August 2021   Next MD Visit "a month"    Prior Therapy for back      Precautions   Precautions Fall      Restrictions   Weight Bearing Restrictions No      Balance Screen  Has the patient fallen in the past 6 months Yes    How many times? 2    Has the patient had a decrease in activity level because of a fear of falling?  No    Is the patient reluctant to leave their home because of a fear of falling?  No      Home Ecologist residence    Living Arrangements Spouse/significant other      Prior Function   Level of Independence Independent      ROM / Strength   AROM / PROM / Strength Strength      Strength   Overall Strength Deficits    Overall Strength Comments hip abd and add in sitting    Strength Assessment Site Hip;Knee    Right/Left Hip Right;Left    Right Hip Flexion 3+/5    Right Hip ABduction 3+/5    Right Hip ADduction 4-/5    Left Hip Flexion 3+/5    Left Hip ABduction 3+/5    Left Hip ADduction 4-/5    Right/Left Knee Right;Left    Right Knee Flexion 3+/5     Right Knee Extension 4/5    Left Knee Flexion 4-/5    Left Knee Extension 4-/5      Ambulation/Gait   Gait Pattern Step-through pattern;Decreased trunk rotation;Wide base of support      Balance   Balance Assessed Yes      Standardized Balance Assessment   Standardized Balance Assessment Berg Balance Test;Five Times Sit to Stand    Five times sit to stand comments  15.43 seconds hands on knees       Berg Balance Test   Sit to Stand Able to stand  independently using hands    Standing Unsupported Able to stand safely 2 minutes    Sitting with Back Unsupported but Feet Supported on Floor or Stool Able to sit safely and securely 2 minutes    Stand to Sit Controls descent by using hands    Transfers Able to transfer safely, definite need of hands    Standing Unsupported with Eyes Closed Able to stand 10 seconds safely    Standing Unsupported with Feet Together Able to place feet together independently and stand for 1 minute with supervision    From Standing, Reach Forward with Outstretched Arm Can reach forward >5 cm safely (2")    From Standing Position, Pick up Object from Floor Able to pick up shoe, needs supervision    From Standing Position, Turn to Look Behind Over each Shoulder Turn sideways only but maintains balance    Turn 360 Degrees Able to turn 360 degrees safely one side only in 4 seconds or less    Standing Unsupported, Alternately Place Feet on Step/Stool Able to stand independently and complete 8 steps >20 seconds    Standing Unsupported, One Foot in Front Able to place foot tandem independently and hold 30 seconds    Standing on One Leg Able to lift leg independently and hold 5-10 seconds    Total Score 44                      Objective measurements completed on examination: See above findings.               PT Education - 06/21/20 1202    Education Details draw in,alternating marching, heel toe raises    Person(s) Educated Patient     Methods Explanation;Demonstration;Handout  Comprehension Verbalized understanding;Returned demonstration               PT Long Term Goals - 06/21/20 1139      PT LONG TERM GOAL #1   Title Patient will be independent with HEP and progression    Time 6    Period Weeks    Status New      PT LONG TERM GOAL #2   Title Patient will reduce risk of falls as noted by ability to perform modified 5x sit to stand in 11 seconds or less.    Baseline 15.4 seconds    Time 6    Period Weeks    Status New      PT LONG TERM GOAL #3   Title Patient will reduce risk of falls as noted by Merrilee Jansky Balance Score of 50+/56.    Time 6    Period Weeks    Status New      PT LONG TERM GOAL #4   Title Patient will demosntrate 4/5 or greater bilateral LE MMT to improve stability during functional tasks.    Baseline --    Time 6    Period Weeks    Status New                  Plan - 06/21/20 1202    Clinical Impression Statement Patient is a 64 year old female who presents to physical therapy with decreased LE MMT decreased balance. Patient's modified 5x sit to stand time of 15 seconds categorizes her as a fall risk with decreased functional LE strength for her age group. Patient's 44/56 Berg Balance Score categorizes her as a low fall risk. Patient noted with decreased trunk rotation during ambulation but otherwise ambulates WFL. Patient and PT discussed plan of care and discussed HEP to which she reported understanding. Patient would benefit from skilled physical therapy to address deficits and address patient's goals.    Personal Factors and Comorbidities Comorbidity 2    Comorbidities arthritis, DM, osteoporosis    Examination-Activity Limitations Locomotion Level;Transfers;Stand    Stability/Clinical Decision Making Stable/Uncomplicated    Clinical Decision Making Low    Rehab Potential Good    PT Frequency 2x / week    PT Duration 6 weeks    PT Treatment/Interventions ADLs/Self Care Home  Management;Cryotherapy;Electrical Stimulation;Moist Heat;Therapeutic activities;Therapeutic exercise;Functional mobility training;Patient/family education;Manual techniques;Gait training;Stair training;Balance training;Neuromuscular re-education;Passive range of motion    PT Next Visit Plan Nustep, LE strengthening, balance activities in various positions    PT Home Exercise Plan see patient education section.    Consulted and Agree with Plan of Care Patient           Patient will benefit from skilled therapeutic intervention in order to improve the following deficits and impairments:  Decreased activity tolerance, Pain, Decreased balance, Decreased strength, Difficulty walking, Abnormal gait, Postural dysfunction  Visit Diagnosis: Unsteadiness on feet - Plan: PT plan of care cert/re-cert  Repeated falls - Plan: PT plan of care cert/re-cert  Muscle weakness (generalized) - Plan: PT plan of care cert/re-cert     Problem List Patient Active Problem List   Diagnosis Date Noted  . Hypothyroidism 07/25/2019  . Chronic idiopathic constipation 07/25/2019  . Chronic midline thoracic back pain 09/19/2018  . DDD (degenerative disc disease), lumbar 11/12/2017  . Osteoporosis 11/12/2017  . Special screening for malignant neoplasms, colon 05/07/2016  . Mixed hyperlipidemia 09/14/2014  . PVC's (premature ventricular contractions) 09/14/2014  . Type 2 diabetes mellitus ([place]) 08/22/2013  .  Essential hypertension 08/22/2013    Gabriela Eves, PT, DPT 06/21/2020, 12:13 PM  [facility] [address] [place], [place], [address] Phone: [phone]   Fax:  [phone]  Name: Lauren Shannon MRN: [id number] Date of Birth: Feb 06, 1956

## 2020-06-25 ENCOUNTER — Other Ambulatory Visit: Payer: Self-pay

## 2020-06-25 ENCOUNTER — Ambulatory Visit: Payer: BC Managed Care – PPO | Admitting: Physical Therapy

## 2020-06-25 DIAGNOSIS — R296 Repeated falls: Secondary | ICD-10-CM | POA: Diagnosis not present

## 2020-06-25 DIAGNOSIS — M6281 Muscle weakness (generalized): Secondary | ICD-10-CM

## 2020-06-25 DIAGNOSIS — R2681 Unsteadiness on feet: Secondary | ICD-10-CM

## 2020-06-25 NOTE — Therapy (Signed)
[facility] [place], [place], [address] Phone: [phone]   Fax:  [phone]  Physical Therapy Treatment  Patient Details  Name: Lauren Shannon MRN: [id number] Date of Birth: 09/05/1955 Referring Provider (PT): Caryl Pina, MD   Encounter Date: 06/25/2020   PT End of Session - 06/25/20 4967    Visit Number 2    Number of Visits 12    Date for PT Re-Evaluation 08/09/20    PT Start Time 1600    PT Stop Time 1646    PT Time Calculation (min) 46 min    Activity Tolerance Patient tolerated treatment well    Behavior During Therapy [facility] for tasks assessed/performed           Past Medical History:  Diagnosis Date  . Allergy   . Arthritis    ostoarthritis  . Basal cell carcinoma   . Essential hypertension    pt states not currently being treated for this  . Headache   . History of pneumonia 1963  . Low back pain   . Mixed hyperlipidemia   . Routine gynecological examination 08/22/2013  . Type 2 diabetes mellitus ([place])    pt stated not currently being treated for this    Past Surgical History:  Procedure Laterality Date  . APPENDECTOMY    . CHOLECYSTECTOMY    . COLONOSCOPY N/A 01/28/2018   Procedure: COLONOSCOPY;  Surgeon: Danie Binder, MD;  [facility];  Service: Endoscopy;  Laterality: N/A;  1:00  . PLANTAR FASCIA SURGERY Right 2012  . stimulation system implant  2015  . THYROID SURGERY  2005  . TONSILLECTOMY    . TUBAL LIGATION  1993  . Uterine polyp removal      There were no vitals filed for this visit.   Subjective Assessment - 06/25/20 1711    Subjective COVID-19 screening performed upon arrival. Patient reports doing well and brought HEP provided from MD.    Pertinent History DM, OA; OP; DDD. interstim bladder implanted device (by left glutes and R psis)    Limitations Walking;House hold activities    Diagnostic tests x-ray: normal    Patient Stated Goals improve balance,  reduce falls              [facility] PT Assessment - 06/25/20 0001      Assessment   Medical Diagnosis Balance Disorder, Recurrent falls    Referring Provider (PT) Caryl Pina, MD    Next MD Visit "a month"    Prior Therapy for back      Precautions   Precautions [facility] Adult PT Treatment/Exercise - 06/25/20 0001      Exercises   Exercises Knee/Hip      Knee/Hip Exercises: Stretches   Other Knee/Hip Stretches LTR 3x30"      Knee/Hip Exercises: Aerobic   Nustep Level 3 x10 mins      Knee/Hip Exercises: Standing   Hip Abduction Stengthening;Both;2 sets;10 reps;Knee straight    Rocker Board 3 minutes    Other Standing Knee Exercises 6" step taps 2x10 1 UE support               Balance Exercises - 06/25/20 0001      Balance Exercises: Standing   Standing Eyes Opened Narrow base of support (BOS);Foam/compliant surface;Other reps (comment)   x1 min balance on  foam; 2x30" head turns   Standing Eyes Closed Foam/compliant surface;2 reps;30 secs    Sidestepping Other (comment)   x2 mins with yellow theraband     Balance Exercises: Seated   Dynamic Sitting No upper extremity support;Lower extremity support - 2;Anterior/posterior weight shift;Lateral weight shift;Reaching outside base of support   cone reaching x10                 PT Long Term Goals - 06/21/20 1139      PT LONG TERM GOAL #1   Title Patient will be independent with HEP and progression    Time 6    Period Weeks    Status New      PT LONG TERM GOAL #2   Title Patient will reduce risk of falls as noted by ability to perform modified 5x sit to stand in 11 seconds or less.    Baseline 15.4 seconds    Time 6    Period Weeks    Status New      PT LONG TERM GOAL #3   Title Patient will reduce risk of falls as noted by Merrilee Jansky Balance Score of 50+/56.    Time 6    Period Weeks    Status New      PT LONG TERM GOAL #4   Title Patient will demosntrate  4/5 or greater bilateral LE MMT to improve stability during functional tasks.    Baseline --    Time 6    Period Weeks    Status New                 Plan - 06/25/20 1644    Clinical Impression Statement Patient responded well to therapy session with no loss of balance. Patient was guided through strengthening and balance activities with great form. Patient provided with close supervision with all TEs and balance activities. Patient and PT reviewed HEP provided by MD and instructed to hold off on advanced balance activities for now for safety and perform exercises marked by PT. Patient reported understanding.    Personal Factors and Comorbidities Comorbidity 2    Comorbidities arthritis, DM, osteoporosis    Examination-Activity Limitations Locomotion Level;Transfers;Stand    Stability/Clinical Decision Making Stable/Uncomplicated    Clinical Decision Making Low    Rehab Potential Good    PT Frequency 2x / week    PT Duration 6 weeks    PT Treatment/Interventions ADLs/Self Care Home Management;Cryotherapy;Electrical Stimulation;Moist Heat;Therapeutic activities;Therapeutic exercise;Functional mobility training;Patient/family education;Manual techniques;Gait training;Stair training;Balance training;Neuromuscular re-education;Passive range of motion    PT Next Visit Plan Nustep, LE strengthening, balance activities in various positions    PT Home Exercise Plan see patient education section.    Consulted and Agree with Plan of Care Patient           Patient will benefit from skilled therapeutic intervention in order to improve the following deficits and impairments:  Decreased activity tolerance, Pain, Decreased balance, Decreased strength, Difficulty walking, Abnormal gait, Postural dysfunction  Visit Diagnosis: Unsteadiness on feet  Repeated falls  Muscle weakness (generalized)     Problem List Patient Active Problem List   Diagnosis Date Noted  . Hypothyroidism  07/25/2019  . Chronic idiopathic constipation 07/25/2019  . Chronic midline thoracic back pain 09/19/2018  . DDD (degenerative disc disease), lumbar 11/12/2017  . Osteoporosis 11/12/2017  . Special screening for malignant neoplasms, colon 05/07/2016  . Mixed hyperlipidemia 09/14/2014  . PVC's (premature ventricular contractions) 09/14/2014  . Type 2 diabetes mellitus ([place])  08/22/2013  . Essential hypertension 08/22/2013    Gabriela Eves, PT, DPT 06/25/2020, 5:15 PM  [facility] [address] [place], [place], [address] Phone: [phone]   Fax:  [phone]  Name: Lauren Shannon MRN: [id number] Date of Birth: [birth year]

## 2020-06-28 ENCOUNTER — Other Ambulatory Visit: Payer: Self-pay

## 2020-06-28 ENCOUNTER — Ambulatory Visit: Payer: BC Managed Care – PPO | Admitting: Physical Therapy

## 2020-06-28 DIAGNOSIS — R296 Repeated falls: Secondary | ICD-10-CM

## 2020-06-28 DIAGNOSIS — M6281 Muscle weakness (generalized): Secondary | ICD-10-CM | POA: Diagnosis not present

## 2020-06-28 DIAGNOSIS — R2681 Unsteadiness on feet: Secondary | ICD-10-CM

## 2020-06-28 NOTE — Therapy (Signed)
[facility] [place], [place], [address] Phone: [phone]   Fax:  [phone]  Physical Therapy Treatment  Patient Details  Name: Lauren Shannon MRN: [id number] Date of Birth: August 24, 1956 Referring Provider (PT): Caryl Pina, MD   Encounter Date: 06/28/2020   PT End of Session - 06/28/20 0909    Visit Number 3    Number of Visits 12    Date for PT Re-Evaluation 08/09/20    PT Start Time 0900    PT Stop Time 0945    PT Time Calculation (min) 45 min    Activity Tolerance Patient tolerated treatment well    Behavior During Therapy [facility] for tasks assessed/performed           Past Medical History:  Diagnosis Date  . Allergy   . Arthritis    ostoarthritis  . Basal cell carcinoma   . Essential hypertension    pt states not currently being treated for this  . Headache   . History of pneumonia 1963  . Low back pain   . Mixed hyperlipidemia   . Routine gynecological examination 08/22/2013  . Type 2 diabetes mellitus ([place])    pt stated not currently being treated for this    Past Surgical History:  Procedure Laterality Date  . APPENDECTOMY    . CHOLECYSTECTOMY    . COLONOSCOPY N/A 01/28/2018   Procedure: COLONOSCOPY;  Surgeon: Danie Binder, MD;  [facility];  Service: Endoscopy;  Laterality: N/A;  1:00  . PLANTAR FASCIA SURGERY Right 2012  . stimulation system implant  2015  . THYROID SURGERY  2005  . TONSILLECTOMY    . TUBAL LIGATION  1993  . Uterine polyp removal      There were no vitals filed for this visit.   Subjective Assessment - 06/28/20 0908    Subjective COVID-19 screening performed upon arrival. Patient reports no new complaints    Pertinent History DM, OA; OP; DDD. interstim bladder implanted device (by left glutes and R psis)    Limitations Walking;House hold activities    Diagnostic tests x-ray: normal    Patient Stated Goals improve balance, reduce falls    Currently in  Pain? Yes   did not provide number on pain scale             [facility] PT Assessment - 06/28/20 0001      Assessment   Medical Diagnosis Balance Disorder, Recurrent falls    Referring Provider (PT) Caryl Pina, MD    Next MD Visit "a month"    Prior Therapy for back      Precautions   Precautions [facility] Adult PT Treatment/Exercise - 06/28/20 0001      Exercises   Exercises Knee/Hip      Knee/Hip Exercises: Stretches   Passive Hamstring Stretch Both;2 reps;30 seconds      Knee/Hip Exercises: Aerobic   Nustep Level 3 x12 mins      Knee/Hip Exercises: Standing   Heel Raises Both;2 sets;10 reps    Heel Raises Limitations toe raises x20    Rocker Board 3 minutes      Knee/Hip Exercises: Supine   Short Arc Quad Sets Strengthening;Both;2 sets;10 reps    Short Arc Quad Sets Limitations 2#               Balance  Exercises - 06/28/20 0001      Balance Exercises: Standing   Standing Eyes Opened Narrow base of support (BOS);Foam/compliant surface;2 reps   reaching out of BOS; no UE support   Standing Eyes Closed Narrow base of support (BOS);1 rep;Time   1 min   Partial Tandem Stance Eyes open;Cognitive challenge;Time   reaching out of BOS 1-6 A-F x4   Step Over Hurdles / [place] step over balance beam, x2 mins     Marching Foam/compliant surface;Static;20 reps   on balance beam                 PT Long Term Goals - 06/21/20 1139      PT LONG TERM GOAL #1   Title Patient will be independent with HEP and progression    Time 6    Period Weeks    Status New      PT LONG TERM GOAL #2   Title Patient will reduce risk of falls as noted by ability to perform modified 5x sit to stand in 11 seconds or less.    Baseline 15.4 seconds    Time 6    Period Weeks    Status New      PT LONG TERM GOAL #3   Title Patient will reduce risk of falls as noted by Merrilee Jansky Balance Score of 50+/56.    Time 6    Period Weeks    Status  New      PT LONG TERM GOAL #4   Title Patient will demosntrate 4/5 or greater bilateral LE MMT to improve stability during functional tasks.    Baseline --    Time 6    Period Weeks    Status New                 Plan - 06/28/20 1100    Clinical Impression Statement Patient responded well to the progression of balance activities with no loss of balance. Patient reported L knee discomfort with nustep but reported a decrease in soreness after rest. Patient demonstrated excellent use of ankle strategies to mantain balance. Excellent pacing demonstrated with TEs.    Personal Factors and Comorbidities Comorbidity 2    Comorbidities arthritis, DM, osteoporosis    Examination-Activity Limitations Locomotion Level;Transfers;Stand    Stability/Clinical Decision Making Stable/Uncomplicated    Clinical Decision Making Low    Rehab Potential Good    PT Frequency 2x / week    PT Duration 6 weeks    PT Treatment/Interventions ADLs/Self Care Home Management;Cryotherapy;Electrical Stimulation;Moist Heat;Therapeutic activities;Therapeutic exercise;Functional mobility training;Patient/family education;Manual techniques;Gait training;Stair training;Balance training;Neuromuscular re-education;Passive range of motion    PT Next Visit Plan Nustep, LE strengthening, balance activities in various positions    PT Home Exercise Plan see patient education section.    Consulted and Agree with Plan of Care Patient           Patient will benefit from skilled therapeutic intervention in order to improve the following deficits and impairments:  Decreased activity tolerance, Pain, Decreased balance, Decreased strength, Difficulty walking, Abnormal gait, Postural dysfunction  Visit Diagnosis: Unsteadiness on feet  Repeated falls  Muscle weakness (generalized)     Problem List Patient Active Problem List   Diagnosis Date Noted  . Hypothyroidism 07/25/2019  . Chronic idiopathic constipation  07/25/2019  . Chronic midline thoracic back pain 09/19/2018  . DDD (degenerative disc disease), lumbar 11/12/2017  . Osteoporosis 11/12/2017  . Special screening for malignant neoplasms, colon 05/07/2016  . Mixed hyperlipidemia 09/14/2014  .  PVC's (premature ventricular contractions) 09/14/2014  . Type 2 diabetes mellitus ([place]) 08/22/2013  . Essential hypertension 08/22/2013    Gabriela Eves, PT, DPT 06/28/2020, 11:05 AM  [facility] [address] [place], [place], [address] Phone: [phone]   Fax:  [phone]  Name: Lauren Shannon MRN: [id number] Date of Birth: [birth year]

## 2020-07-01 ENCOUNTER — Encounter: Payer: Self-pay | Admitting: Physical Therapy

## 2020-07-01 ENCOUNTER — Ambulatory Visit: Payer: BC Managed Care – PPO | Attending: Family Medicine | Admitting: Physical Therapy

## 2020-07-01 ENCOUNTER — Other Ambulatory Visit: Payer: Self-pay

## 2020-07-01 DIAGNOSIS — R2681 Unsteadiness on feet: Secondary | ICD-10-CM

## 2020-07-01 DIAGNOSIS — M6281 Muscle weakness (generalized): Secondary | ICD-10-CM | POA: Diagnosis not present

## 2020-07-01 DIAGNOSIS — R296 Repeated falls: Secondary | ICD-10-CM

## 2020-07-01 NOTE — Therapy (Signed)
[facility] [place], [place], [address] Phone: [phone]   Fax:  [phone]  Physical Therapy Treatment  Patient Details  Name: Lauren Shannon MRN: [id number] Date of Birth: 02/03/1956 Referring Provider (PT): Caryl Pina, MD   Encounter Date: 07/01/2020   PT End of Session - 07/01/20 1349    Visit Number 4    Number of Visits 12    Date for PT Re-Evaluation 08/09/20    PT Start Time 1300    PT Stop Time 1344    PT Time Calculation (min) 44 min    Activity Tolerance Patient tolerated treatment well    Behavior During Therapy [facility] for tasks assessed/performed           Past Medical History:  Diagnosis Date  . Allergy   . Arthritis    ostoarthritis  . Basal cell carcinoma   . Essential hypertension    pt states not currently being treated for this  . Headache   . History of pneumonia 1963  . Low back pain   . Mixed hyperlipidemia   . Routine gynecological examination 08/22/2013  . Type 2 diabetes mellitus ([place])    pt stated not currently being treated for this    Past Surgical History:  Procedure Laterality Date  . APPENDECTOMY    . CHOLECYSTECTOMY    . COLONOSCOPY N/A 01/28/2018   Procedure: COLONOSCOPY;  Surgeon: Danie Binder, MD;  [facility];  Service: Endoscopy;  Laterality: N/A;  1:00  . PLANTAR FASCIA SURGERY Right 2012  . stimulation system implant  2015  . THYROID SURGERY  2005  . TONSILLECTOMY    . TUBAL LIGATION  1993  . Uterine polyp removal      There were no vitals filed for this visit.   Subjective Assessment - 07/01/20 1308    Subjective COVID-19 screening performed upon arrival. Patient reported some L knee pain but overall doing well.    Pertinent History DM, OA; OP; DDD. interstim bladder implanted device (by left glutes and R psis)    Limitations Walking;House hold activities    Diagnostic tests x-ray: normal    Patient Stated Goals improve balance, reduce  falls    Currently in Pain? Yes    Pain Score 5     Pain Location Knee    Pain Orientation Left    Pain Descriptors / Indicators Throbbing    Pain Type Chronic pain;Acute pain    Pain Onset More than a month ago    Pain Frequency Constant              [facility] PT Assessment - 07/01/20 0001      Assessment   Medical Diagnosis Balance Disorder, Recurrent falls    Referring Provider (PT) Caryl Pina, MD    Next MD Visit "a month"    Prior Therapy for back      Precautions   Precautions [facility] Adult PT Treatment/Exercise - 07/01/20 0001      Exercises   Exercises Knee/Hip;Lumbar      Lumbar Exercises: Seated   Other Seated Lumbar Exercises seated core crunch physioball 2x10      Knee/Hip Exercises: Aerobic   Nustep Level 3 x10 mins      Knee/Hip Exercises: Standing   Rocker Board 3 minutes      Knee/Hip Exercises:  Seated   Long Arc Quad Strengthening;Both;2 sets;10 reps;Weights   3" hold    Long Arc Quad Weight 2 lbs.    Clamshell with TheraBand Red   2x10   Marching Strengthening;Both;2 sets;10 reps    Hamstring Curl Strengthening;Both;2 sets;10 reps               Balance Exercises - 07/01/20 0001      Balance Exercises: Standing   Standing Eyes Opened Wide (BOA);Solid surface   2x22min with ball toss   Marching Foam/compliant surface;Static;20 reps   on airex, 1 UE support   Heel Raises Both;20 reps   on airex   Toe Raise Both;20 reps   on airex   Other Standing Exercises bosu ball balance intermittent support 2x1 min followed by mini squats with UE support                  PT Long Term Goals - 06/21/20 1139      PT LONG TERM GOAL #1   Title Patient will be independent with HEP and progression    Time 6    Period Weeks    Status New      PT LONG TERM GOAL #2   Title Patient will reduce risk of falls as noted by ability to perform modified 5x sit to stand in 11 seconds or less.    Baseline  15.4 seconds    Time 6    Period Weeks    Status New      PT LONG TERM GOAL #3   Title Patient will reduce risk of falls as noted by Merrilee Jansky Balance Score of 50+/56.    Time 6    Period Weeks    Status New      PT LONG TERM GOAL #4   Title Patient will demosntrate 4/5 or greater bilateral LE MMT to improve stability during functional tasks.    Baseline --    Time 6    Period Weeks    Status New                 Plan - 07/01/20 1340    Clinical Impression Statement Patient responded well to therapy session with no reports of increased left knee pain. Balance activities were progressed with excellent use of the appropriate balance strategies. Contact guard provided for bosu balance but with decreasing support to close supervision for second set.    Personal Factors and Comorbidities Comorbidity 2    Comorbidities arthritis, DM, osteoporosis    Examination-Activity Limitations Locomotion Level;Transfers;Stand    Stability/Clinical Decision Making Stable/Uncomplicated    Clinical Decision Making Low    Rehab Potential Good           Patient will benefit from skilled therapeutic intervention in order to improve the following deficits and impairments:  Decreased activity tolerance, Pain, Decreased balance, Decreased strength, Difficulty walking, Abnormal gait, Postural dysfunction  Visit Diagnosis: Unsteadiness on feet  Repeated falls  Muscle weakness (generalized)     Problem List Patient Active Problem List   Diagnosis Date Noted  . Hypothyroidism 07/25/2019  . Chronic idiopathic constipation 07/25/2019  . Chronic midline thoracic back pain 09/19/2018  . DDD (degenerative disc disease), lumbar 11/12/2017  . Osteoporosis 11/12/2017  . Special screening for malignant neoplasms, colon 05/07/2016  . Mixed hyperlipidemia 09/14/2014  . PVC's (premature ventricular contractions) 09/14/2014  . Type 2 diabetes mellitus ([place]) 08/22/2013  . Essential hypertension  08/22/2013    Gabriela Eves, PT, DPT 07/01/2020, 2:58  PM  [facility] [place], [place], [address] Phone: [phone]   Fax:  [phone]  Name: Favor Kreh MRN: [id number] Date of Birth: [birth year]

## 2020-07-03 ENCOUNTER — Encounter: Payer: Self-pay | Admitting: *Deleted

## 2020-07-05 ENCOUNTER — Ambulatory Visit: Payer: BC Managed Care – PPO | Admitting: *Deleted

## 2020-07-05 ENCOUNTER — Other Ambulatory Visit: Payer: Self-pay

## 2020-07-05 DIAGNOSIS — R296 Repeated falls: Secondary | ICD-10-CM | POA: Diagnosis not present

## 2020-07-05 DIAGNOSIS — M6281 Muscle weakness (generalized): Secondary | ICD-10-CM | POA: Diagnosis not present

## 2020-07-05 DIAGNOSIS — R2681 Unsteadiness on feet: Secondary | ICD-10-CM | POA: Diagnosis not present

## 2020-07-05 NOTE — Therapy (Signed)
[facility] [place], [place], [address] Phone: [phone]   Fax:  [phone]  Physical Therapy Treatment  Patient Details  Name: Lauren Shannon MRN: [id number] Date of Birth: 10-Jan-1956 Referring Provider (PT): Caryl Pina, MD   Encounter Date: 07/05/2020   PT End of Session - 07/05/20 0832    Visit Number 5    Number of Visits 12    Date for PT Re-Evaluation 08/09/20    PT Start Time 0821    PT Stop Time 0908    PT Time Calculation (min) 47 min           Past Medical History:  Diagnosis Date  . Allergy   . Arthritis    ostoarthritis  . Basal cell carcinoma   . Essential hypertension    pt states not currently being treated for this  . Headache   . History of pneumonia 1963  . Low back pain   . Mixed hyperlipidemia   . Routine gynecological examination 08/22/2013  . Type 2 diabetes mellitus ([place])    pt stated not currently being treated for this    Past Surgical History:  Procedure Laterality Date  . APPENDECTOMY    . CHOLECYSTECTOMY    . COLONOSCOPY N/A 01/28/2018   Procedure: COLONOSCOPY;  Surgeon: Danie Binder, MD;  [facility];  Service: Endoscopy;  Laterality: N/A;  1:00  . PLANTAR FASCIA SURGERY Right 2012  . stimulation system implant  2015  . THYROID SURGERY  2005  . TONSILLECTOMY    . TUBAL LIGATION  1993  . Uterine polyp removal      There were no vitals filed for this visit.   Subjective Assessment - 07/05/20 0825    Subjective COVID-19 screening performed upon arrival. Patient reported some L knee pain but overall doing well.    Pertinent History DM, OA; OP; DDD. interstim bladder implanted device (by left glutes and R psis)    Limitations Walking;House hold activities    Diagnostic tests x-ray: normal    Patient Stated Goals improve balance, reduce falls    Currently in Pain? Yes    Pain Orientation Left    Pain Descriptors / Indicators Throbbing    Pain Type  Chronic pain;Acute pain    Pain Onset More than a month ago                             [facility] Adult PT Treatment/Exercise - 07/05/20 0001      Exercises   Exercises Knee/Hip;Lumbar      Lumbar Exercises: Standing   Row Strengthening;Both;Theraband   XTS blue 4x10   Shoulder Extension Strengthening;Both;Theraband   XTS 3x10   Other Standing Lumbar Exercises XTS resisted walking XTS blue x 5each way with SBA 4 dirextions      Knee/Hip Exercises: Aerobic   Nustep Level 3 x12 mins      Knee/Hip Exercises: Standing   Rocker Board 5 minutes   PF/DF and balance   Other Standing Knee Exercises Balance: one foot on 6in box holds x 5 each side hol10-20 secs each.                       PT Long Term Goals - 06/21/20 1139      PT LONG TERM GOAL #1   Title Patient will be independent with HEP and progression    Time 6  Period Weeks    Status New      PT LONG TERM GOAL #2   Title Patient will reduce risk of falls as noted by ability to perform modified 5x sit to stand in 11 seconds or less.    Baseline 15.4 seconds    Time 6    Period Weeks    Status New      PT LONG TERM GOAL #3   Title Patient will reduce risk of falls as noted by Merrilee Jansky Balance Score of 50+/56.    Time 6    Period Weeks    Status New      PT LONG TERM GOAL #4   Title Patient will demosntrate 4/5 or greater bilateral LE MMT to improve stability during functional tasks.    Baseline --    Time 6    Period Weeks    Status New                 Plan - 07/05/20 0831    Clinical Impression Statement Pt arrived today doing fairly well and reports that Rxs are helping and her balance i s getting better. Rx focused on core and posture strengthening as well as balancing act.'s Resisted walking added today and did well.    Personal Factors and Comorbidities Comorbidity 2    Comorbidities arthritis, DM, osteoporosis    Examination-Activity Limitations Locomotion  Level;Transfers;Stand    Stability/Clinical Decision Making Stable/Uncomplicated    Rehab Potential Good    PT Frequency 2x / week    PT Treatment/Interventions ADLs/Self Care Home Management;Cryotherapy;Electrical Stimulation;Moist Heat;Therapeutic activities;Therapeutic exercise;Functional mobility training;Patient/family education;Manual techniques;Gait training;Stair training;Balance training;Neuromuscular re-education;Passive range of motion    PT Next Visit Plan Nustep, LE strengthening, balance activities in various positions    PT Home Exercise Plan see patient education section.    Consulted and Agree with Plan of Care Patient           Patient will benefit from skilled therapeutic intervention in order to improve the following deficits and impairments:  Decreased activity tolerance, Pain, Decreased balance, Decreased strength, Difficulty walking, Abnormal gait, Postural dysfunction  Visit Diagnosis: Unsteadiness on feet  Repeated falls  Muscle weakness (generalized)     Problem List Patient Active Problem List   Diagnosis Date Noted  . Hypothyroidism 07/25/2019  . Chronic idiopathic constipation 07/25/2019  . Chronic midline thoracic back pain 09/19/2018  . DDD (degenerative disc disease), lumbar 11/12/2017  . Osteoporosis 11/12/2017  . Special screening for malignant neoplasms, colon 05/07/2016  . Mixed hyperlipidemia 09/14/2014  . PVC's (premature ventricular contractions) 09/14/2014  . Type 2 diabetes mellitus ([place]) 08/22/2013  . Essential hypertension 08/22/2013    Shuan Statzer,CHRIS, PTA 07/05/2020, 11:08 AM  [facility] [address] [place], [place], [address] Phone: [phone]   Fax:  [phone]  Name: Lauren Shannon MRN: [id number] Date of Birth: [birth year]

## 2020-07-06 ENCOUNTER — Other Ambulatory Visit: Payer: Self-pay | Admitting: Family Medicine

## 2020-07-06 DIAGNOSIS — M810 Age-related osteoporosis without current pathological fracture: Secondary | ICD-10-CM

## 2020-07-09 ENCOUNTER — Other Ambulatory Visit: Payer: Self-pay

## 2020-07-09 ENCOUNTER — Ambulatory Visit: Payer: BC Managed Care – PPO | Admitting: Physical Therapy

## 2020-07-09 ENCOUNTER — Encounter: Payer: Self-pay | Admitting: Physical Therapy

## 2020-07-09 DIAGNOSIS — R296 Repeated falls: Secondary | ICD-10-CM | POA: Diagnosis not present

## 2020-07-09 DIAGNOSIS — M6281 Muscle weakness (generalized): Secondary | ICD-10-CM

## 2020-07-09 DIAGNOSIS — R2681 Unsteadiness on feet: Secondary | ICD-10-CM | POA: Diagnosis not present

## 2020-07-09 NOTE — Therapy (Signed)
[facility] [place], [place], [address] Phone: [phone]   Fax:  [phone]  Physical Therapy Treatment  Patient Details  Name: Lauren Shannon MRN: [id number] Date of Birth: [birth year] Referring Provider (PT): Caryl Pina, MD   Encounter Date: 07/09/2020   PT End of Session - 07/09/20 0741    Visit Number 6    Number of Visits 12    Date for PT Re-Evaluation 08/09/20    PT Start Time 0732    PT Stop Time 0813    PT Time Calculation (min) 41 min    Activity Tolerance Patient tolerated treatment well    Behavior During Therapy [facility] for tasks assessed/performed           Past Medical History:  Diagnosis Date  . Allergy   . Arthritis    ostoarthritis  . Basal cell carcinoma   . Essential hypertension    pt states not currently being treated for this  . Headache   . History of pneumonia 1963  . Low back pain   . Mixed hyperlipidemia   . Routine gynecological examination 08/22/2013  . Type 2 diabetes mellitus ([place])    pt stated not currently being treated for this    Past Surgical History:  Procedure Laterality Date  . APPENDECTOMY    . CHOLECYSTECTOMY    . COLONOSCOPY N/A 01/28/2018   Procedure: COLONOSCOPY;  Surgeon: Danie Binder, MD;  [facility];  Service: Endoscopy;  Laterality: N/A;  1:00  . PLANTAR FASCIA SURGERY Right 2012  . stimulation system implant  2015  . THYROID SURGERY  2005  . TONSILLECTOMY    . TUBAL LIGATION  1993  . Uterine polyp removal      There were no vitals filed for this visit.   Subjective Assessment - 07/09/20 0740    Subjective COVID-19 screening performed upon arrival. Patient denies any pain or discomfort.    Pertinent History DM, OA; OP; DDD. interstim bladder implanted device (by left glutes and R psis)    Limitations Walking;House hold activities    Diagnostic tests x-ray: normal    Patient Stated Goals improve balance, reduce falls    Currently  in Pain? No/denies              [facility] PT Assessment - 07/09/20 0001      Assessment   Medical Diagnosis Balance Disorder, Recurrent falls    Referring Provider (PT) Caryl Pina, MD    Next MD Visit "a month"    Prior Therapy for back      Precautions   Precautions Bernerd Limbo Adult PT Treatment/Exercise - 07/09/20 0001      Lumbar Exercises: Standing   Row Strengthening;Both;15 reps    Row Limitations Green XTS    Shoulder Extension Strengthening;Both;15 reps;Limitations    Shoulder Extension Limitations Green XTS    Other Standing Lumbar Exercises Chop/lift green XTS x15 reps      Knee/Hip Exercises: Aerobic   Nustep L5 x12 min      Knee/Hip Exercises: Standing   Walking with Sports Cord 4D Blue XTS walking x5 reps each               Balance Exercises - 07/09/20 0001      Balance Exercises: Standing   Standing Eyes Opened Narrow base of support (BOS);Foam/compliant surface;Limitations  Standing Eyes Opened Limitations with diagonals x15 rep    Standing Eyes Closed Narrow base of support (BOS);Foam/compliant surface;1 rep;Time    Standing Eyes Closed Time 1 min    Tandem Stance Eyes open;Foam/compliant surface;Time   x2 min   Tandem Stance Limitations semitandem    Standing, One Foot on a Step Eyes open;6 inch;Limitations    Standing, One Foot on a Step Limitations with reachouts x15 reps    Sidestepping Foam/compliant support;5 reps    Other Standing Exercises Across midline reaching on airex x15 reps                  PT Long Term Goals - 07/09/20 2376      PT LONG TERM GOAL #1   Title Patient will be independent with HEP and progression    Time 6    Period Weeks    Status Achieved      PT LONG TERM GOAL #2   Title Patient will reduce risk of falls as noted by ability to perform modified 5x sit to stand in 11 seconds or less.    Baseline 15.4 seconds    Time 6    Period Weeks    Status On-going       PT LONG TERM GOAL #3   Title Patient will reduce risk of falls as noted by Merrilee Jansky Balance Score of 50+/56.    Time 6    Period Weeks    Status On-going      PT LONG TERM GOAL #4   Title Patient will demosntrate 4/5 or greater bilateral LE MMT to improve stability during functional tasks.    Baseline --    Time 6    Period Weeks    Status On-going                 Plan - 07/09/20 0819    Clinical Impression Statement Patient presented in clinic with no complaints of pain. Patient did indicate beginning hip soreness with resisted sidestepping. Primary focus of standing exercise was for core activation and stability. Appropriate ankle strategy noted during all balance activities regardless of solid or uneven surface.    Personal Factors and Comorbidities Comorbidity 2    Comorbidities arthritis, DM, osteoporosis    Examination-Activity Limitations Locomotion Level;Transfers;Stand    Stability/Clinical Decision Making Stable/Uncomplicated    Rehab Potential Good    PT Frequency 2x / week    PT Duration 6 weeks    PT Treatment/Interventions ADLs/Self Care Home Management;Cryotherapy;Electrical Stimulation;Moist Heat;Therapeutic activities;Therapeutic exercise;Functional mobility training;Patient/family education;Manual techniques;Gait training;Stair training;Balance training;Neuromuscular re-education;Passive range of motion    PT Next Visit Plan Nustep, LE strengthening, balance activities in various positions    PT Home Exercise Plan see patient education section.    Consulted and Agree with Plan of Care Patient           Patient will benefit from skilled therapeutic intervention in order to improve the following deficits and impairments:  Decreased activity tolerance, Pain, Decreased balance, Decreased strength, Difficulty walking, Abnormal gait, Postural dysfunction  Visit Diagnosis: Unsteadiness on feet  Repeated falls  Muscle weakness (generalized)     Problem  List Patient Active Problem List   Diagnosis Date Noted  . Hypothyroidism 07/25/2019  . Chronic idiopathic constipation 07/25/2019  . Chronic midline thoracic back pain 09/19/2018  . DDD (degenerative disc disease), lumbar 11/12/2017  . Osteoporosis 11/12/2017  . Special screening for malignant neoplasms, colon 05/07/2016  . Mixed hyperlipidemia 09/14/2014  . PVC's (premature  ventricular contractions) 09/14/2014  . Type 2 diabetes mellitus ([place]) 08/22/2013  . Essential hypertension 08/22/2013    Standley Brooking, PTA 07/09/2020, 8:23 AM  [facility] [address] [place], [place], [address] Phone: [phone]   Fax:  [phone]  Name: Lauren Shannon MRN: [id number] Date of Birth: [birth year]

## 2020-07-12 ENCOUNTER — Other Ambulatory Visit: Payer: BC Managed Care – PPO

## 2020-07-12 ENCOUNTER — Encounter: Payer: Self-pay | Admitting: Physical Therapy

## 2020-07-12 ENCOUNTER — Ambulatory Visit: Payer: BC Managed Care – PPO | Admitting: Physical Therapy

## 2020-07-12 ENCOUNTER — Other Ambulatory Visit: Payer: Self-pay

## 2020-07-12 DIAGNOSIS — M6281 Muscle weakness (generalized): Secondary | ICD-10-CM

## 2020-07-12 DIAGNOSIS — E782 Mixed hyperlipidemia: Secondary | ICD-10-CM

## 2020-07-12 DIAGNOSIS — R296 Repeated falls: Secondary | ICD-10-CM

## 2020-07-12 DIAGNOSIS — E119 Type 2 diabetes mellitus without complications: Secondary | ICD-10-CM | POA: Diagnosis not present

## 2020-07-12 DIAGNOSIS — R2681 Unsteadiness on feet: Secondary | ICD-10-CM | POA: Diagnosis not present

## 2020-07-12 DIAGNOSIS — I10 Essential (primary) hypertension: Secondary | ICD-10-CM

## 2020-07-12 DIAGNOSIS — E039 Hypothyroidism, unspecified: Secondary | ICD-10-CM | POA: Diagnosis not present

## 2020-07-12 LAB — BAYER DCA HB A1C WAIVED: HB A1C (BAYER DCA - WAIVED): 5.6 % (ref <7.0)

## 2020-07-12 NOTE — Therapy (Signed)
[facility] [place], [place], [address] Phone: [phone]   Fax:  [phone]  Physical Therapy Treatment  Patient Details  Name: Lauren Shannon MRN: [id number] Date of Birth: 09/07/1955 Referring Provider (PT): Caryl Pina, MD   Encounter Date: 07/12/2020   PT End of Session - 07/12/20 0823    Visit Number 7    Number of Visits 12    Date for PT Re-Evaluation 08/09/20    PT Start Time 0730    PT Stop Time 0817    PT Time Calculation (min) 47 min    Activity Tolerance Patient tolerated treatment well    Behavior During Therapy [facility] for tasks assessed/performed           Past Medical History:  Diagnosis Date  . Allergy   . Arthritis    ostoarthritis  . Basal cell carcinoma   . Essential hypertension    pt states not currently being treated for this  . Headache   . History of pneumonia 1963  . Low back pain   . Mixed hyperlipidemia   . Routine gynecological examination 08/22/2013  . Type 2 diabetes mellitus ([place])    pt stated not currently being treated for this    Past Surgical History:  Procedure Laterality Date  . APPENDECTOMY    . CHOLECYSTECTOMY    . COLONOSCOPY N/A 01/28/2018   Procedure: COLONOSCOPY;  Surgeon: Danie Binder, MD;  [facility];  Service: Endoscopy;  Laterality: N/A;  1:00  . PLANTAR FASCIA SURGERY Right 2012  . stimulation system implant  2015  . THYROID SURGERY  2005  . TONSILLECTOMY    . TUBAL LIGATION  1993  . Uterine polyp removal      There were no vitals filed for this visit.   Subjective Assessment - 07/12/20 0823    Subjective COVID-19 screening performed upon arrival. Patient reported doing well.    Pertinent History DM, OA; OP; DDD. interstim bladder implanted device (by left glutes and R psis)    Limitations Walking;House hold activities    Diagnostic tests x-ray: normal    Patient Stated Goals improve balance, reduce falls    Currently in Pain?  No/denies              [facility] PT Assessment - 07/12/20 0001      Assessment   Medical Diagnosis Balance Disorder, Recurrent falls    Referring Provider (PT) Caryl Pina, MD    Next MD Visit "a month"    Prior Therapy for back      Precautions   Precautions Bernerd Limbo Adult PT Treatment/Exercise - 07/12/20 0001      Exercises   Exercises Knee/Hip;Lumbar      Lumbar Exercises: Standing   Row Strengthening;Both;20 reps    Row Limitations Blue XTS    Shoulder Extension Strengthening;Both;Limitations;20 reps    Shoulder Extension Limitations Blue XTS    Other Standing Lumbar Exercises Chop/lift blue XTS x20 reps      Knee/Hip Exercises: Aerobic   Nustep L4 x12 min      Knee/Hip Exercises: Machines for Strengthening   Cybex Knee Extension 10# x20    Cybex Knee Flexion 10# x20      Knee/Hip Exercises: Standing   Rocker Board 3 minutes  Balance Exercises - 07/12/20 0001      Balance Exercises: Standing   Standing Eyes Opened Narrow base of support (BOS);Foam/compliant surface;2 reps;Time   x1 minute with reach outs to sticky notes   SLS Eyes open;Solid surface;30 secs;4 reps    Rockerboard Anterior/posterior;Lateral;Other time (comment);EO   x1 minute each   Tandem Gait Forward;Intermittent upper extremity support;Foam/compliant surface;2 reps   x1 on beam; x1 on floor   Retro Gait Other (comment)   x1 minute   Sidestepping Foam/compliant support;Other (comment)   x3 minutes                 PT Long Term Goals - 07/09/20 7619      PT LONG TERM GOAL #1   Title Patient will be independent with HEP and progression    Time 6    Period Weeks    Status Achieved      PT LONG TERM GOAL #2   Title Patient will reduce risk of falls as noted by ability to perform modified 5x sit to stand in 11 seconds or less.    Baseline 15.4 seconds    Time 6    Period Weeks    Status On-going      PT LONG TERM  GOAL #3   Title Patient will reduce risk of falls as noted by Merrilee Jansky Balance Score of 50+/56.    Time 6    Period Weeks    Status On-going      PT LONG TERM GOAL #4   Title Patient will demosntrate 4/5 or greater bilateral LE MMT to improve stability during functional tasks.    Baseline --    Time 6    Period Weeks    Status On-going                 Plan - 07/12/20 5093    Clinical Impression Statement Patient tolerated treatment well no reports of pain. Patient guided though new balance activities in which patinet was able to complete with stand by assistance. Excellent use of ankle strategies with balance activities. Core activation activities performed with good form and technique.    Personal Factors and Comorbidities Comorbidity 2    Comorbidities arthritis, DM, osteoporosis    Examination-Activity Limitations Locomotion Level;Transfers;Stand    Stability/Clinical Decision Making Stable/Uncomplicated    Clinical Decision Making Low    Rehab Potential Good    PT Frequency 2x / week    PT Duration 6 weeks    PT Treatment/Interventions ADLs/Self Care Home Management;Cryotherapy;Electrical Stimulation;Moist Heat;Therapeutic activities;Therapeutic exercise;Functional mobility training;Patient/family education;Manual techniques;Gait training;Stair training;Balance training;Neuromuscular re-education;Passive range of motion    PT Next Visit Plan Nustep, LE strengthening, balance activities in various positions    PT Home Exercise Plan see patient education section.    Consulted and Agree with Plan of Care Patient           Patient will benefit from skilled therapeutic intervention in order to improve the following deficits and impairments:  Decreased activity tolerance, Pain, Decreased balance, Decreased strength, Difficulty walking, Abnormal gait, Postural dysfunction  Visit Diagnosis: Unsteadiness on feet  Repeated falls  Muscle weakness (generalized)     Problem  List Patient Active Problem List   Diagnosis Date Noted  . Hypothyroidism 07/25/2019  . Chronic idiopathic constipation 07/25/2019  . Chronic midline thoracic back pain 09/19/2018  . DDD (degenerative disc disease), lumbar 11/12/2017  . Osteoporosis 11/12/2017  . Special screening for malignant neoplasms, colon 05/07/2016  . Mixed hyperlipidemia 09/14/2014  .  PVC's (premature ventricular contractions) 09/14/2014  . Type 2 diabetes mellitus ([place]) 08/22/2013  . Essential hypertension 08/22/2013    Gabriela Eves, PT, DPT 07/12/2020, 8:30 AM  [facility] [address] [place], [place], [address] Phone: [phone]   Fax:  [phone]  Name: Nijah Tejera MRN: [id number] Date of Birth: [birth year]

## 2020-07-13 LAB — LIPID PANEL
Chol/HDL Ratio: 2.2 ratio (ref 0.0–4.4)
Cholesterol, Total: 170 mg/dL (ref 100–199)
HDL: 79 mg/dL (ref >39)
LDL Chol Calc (NIH): 75 mg/dL (ref 0–99)
Triglycerides: 91 mg/dL (ref 0–149)
VLDL Cholesterol Cal: 16 mg/dL (ref 5–40)

## 2020-07-13 LAB — CMP14+EGFR
ALT: 26 IU/L (ref 0–32)
AST: 20 IU/L (ref 0–40)
Albumin/Globulin Ratio: 2.1 (ref 1.2–2.2)
Albumin: 4.7 g/dL (ref 3.8–4.8)
Alkaline Phosphatase: 57 IU/L (ref 44–121)
BUN/Creatinine Ratio: 20 (ref 12–28)
BUN: 15 mg/dL (ref 8–27)
Bilirubin Total: 0.9 mg/dL (ref 0.0–1.2)
CO2: 26 mmol/L (ref 20–29)
Calcium: 9.6 mg/dL (ref 8.7–10.3)
Chloride: 103 mmol/L (ref 96–106)
Creatinine, Ser: 0.74 mg/dL (ref 0.57–1.00)
GFR calc Af Amer: 100 mL/min/{1.73_m2} (ref >59)
GFR calc non Af Amer: 86 mL/min/{1.73_m2} (ref >59)
Globulin, Total: 2.2 g/dL (ref 1.5–4.5)
Glucose: 109 mg/dL — ABNORMAL HIGH (ref 65–99)
Potassium: 4.5 mmol/L (ref 3.5–5.2)
Sodium: 140 mmol/L (ref 134–144)
Total Protein: 6.9 g/dL (ref 6.0–8.5)

## 2020-07-13 LAB — CBC WITH DIFFERENTIAL/PLATELET
Basophils Absolute: 0.1 10*3/uL (ref 0.0–0.2)
Basos: 1 %
EOS (ABSOLUTE): 0.1 10*3/uL (ref 0.0–0.4)
Eos: 1 %
Hematocrit: 43.3 % (ref 34.0–46.6)
Hemoglobin: 14.5 g/dL (ref 11.1–15.9)
Immature Grans (Abs): 0 10*3/uL (ref 0.0–0.1)
Immature Granulocytes: 0 %
Lymphocytes Absolute: 1.7 10*3/uL (ref 0.7–3.1)
Lymphs: 32 %
MCH: 31.4 pg (ref 26.6–33.0)
MCHC: 33.5 g/dL (ref 31.5–35.7)
MCV: 94 fL (ref 79–97)
Monocytes Absolute: 0.5 10*3/uL (ref 0.1–0.9)
Monocytes: 10 %
Neutrophils Absolute: 3 10*3/uL (ref 1.4–7.0)
Neutrophils: 56 %
Platelets: 228 10*3/uL (ref 150–450)
RBC: 4.62 x10E6/uL (ref 3.77–5.28)
RDW: 11.7 % (ref 11.7–15.4)
WBC: 5.3 10*3/uL (ref 3.4–10.8)

## 2020-07-13 LAB — THYROID PANEL WITH TSH
Free Thyroxine Index: 1.9 (ref 1.2–4.9)
T3 Uptake Ratio: 30 % (ref 24–39)
T4, Total: 6.2 ug/dL (ref 4.5–12.0)
TSH: 1.7 u[IU]/mL (ref 0.450–4.500)

## 2020-07-16 ENCOUNTER — Ambulatory Visit: Payer: BC Managed Care – PPO | Admitting: Physical Therapy

## 2020-07-19 ENCOUNTER — Other Ambulatory Visit: Payer: Self-pay

## 2020-07-19 ENCOUNTER — Ambulatory Visit: Payer: BC Managed Care – PPO | Admitting: Physical Therapy

## 2020-07-19 DIAGNOSIS — R2681 Unsteadiness on feet: Secondary | ICD-10-CM | POA: Diagnosis not present

## 2020-07-19 DIAGNOSIS — R296 Repeated falls: Secondary | ICD-10-CM | POA: Diagnosis not present

## 2020-07-19 DIAGNOSIS — M6281 Muscle weakness (generalized): Secondary | ICD-10-CM

## 2020-07-19 NOTE — Therapy (Signed)
[facility] [place], [place], [address] Phone: [phone]   Fax:  [phone]  Physical Therapy Treatment  Patient Details  Name: Lauren Shannon MRN: [id number] Date of Birth: 16-Nov-1955 Referring Provider (PT): Caryl Pina, MD   Encounter Date: 07/19/2020   PT End of Session - 07/19/20 0739    Visit Number 8    Number of Visits 12    Date for PT Re-Evaluation 08/09/20    PT Start Time 0730    PT Stop Time 0815    PT Time Calculation (min) 45 min    Activity Tolerance Patient tolerated treatment well    Behavior During Therapy [facility] for tasks assessed/performed           Past Medical History:  Diagnosis Date  . Allergy   . Arthritis    ostoarthritis  . Basal cell carcinoma   . Essential hypertension    pt states not currently being treated for this  . Headache   . History of pneumonia 1963  . Low back pain   . Mixed hyperlipidemia   . Routine gynecological examination 08/22/2013  . Type 2 diabetes mellitus ([place])    pt stated not currently being treated for this    Past Surgical History:  Procedure Laterality Date  . APPENDECTOMY    . CHOLECYSTECTOMY    . COLONOSCOPY N/A 01/28/2018   Procedure: COLONOSCOPY;  Surgeon: Danie Binder, MD;  [facility];  Service: Endoscopy;  Laterality: N/A;  1:00  . PLANTAR FASCIA SURGERY Right 2012  . stimulation system implant  2015  . THYROID SURGERY  2005  . TONSILLECTOMY    . TUBAL LIGATION  1993  . Uterine polyp removal      There were no vitals filed for this visit.   Subjective Assessment - 07/19/20 0737    Subjective COVID-19 screening performed upon arrival. Patient reports doing well just has some discomfort in left upper arm possibly due the XTS last visit    Pertinent History DM, OA; OP; DDD. interstim bladder implanted device (by left glutes and R psis)    Limitations Walking;House hold activities    Diagnostic tests x-ray: normal     Patient Stated Goals improve balance, reduce falls    Currently in Pain? No/denies              [facility] PT Assessment - 07/19/20 0001      Assessment   Medical Diagnosis Balance Disorder, Recurrent falls    Referring Provider (PT) Caryl Pina, MD    Next MD Visit "a month"    Prior Therapy for back      Precautions   Precautions Bernerd Limbo Adult PT Treatment/Exercise - 07/19/20 0001      Exercises   Exercises Knee/Hip;Lumbar      Knee/Hip Exercises: Aerobic   Nustep L4 x12 min      Knee/Hip Exercises: Machines for Strengthening   Cybex Knee Extension 10# x20    Cybex Knee Flexion 20# x20    Cybex Leg Press 1 plate 2x10      Knee/Hip Exercises: Standing   Rocker Board 3 minutes               Balance Exercises - 07/19/20 0001      Balance Exercises: Standing   Tandem Stance Eyes open;Foam/compliant surface;Time;2 reps  1 min each   SLS with Vectors Solid surface;Intermittent upper extremity assist;2 reps;Time   reaching to pods x10 each   Rockerboard Anterior/posterior;Lateral;Other time (comment);Intermittent UE support   x1 min each with cognitive task   Tandem Gait Forward;Intermittent upper extremity support;Foam/compliant surface;2 reps   x3 mins   Sidestepping Foam/compliant support;Other (comment)   x3 mins                 PT Long Term Goals - 07/09/20 1950      PT LONG TERM GOAL #1   Title Patient will be independent with HEP and progression    Time 6    Period Weeks    Status Achieved      PT LONG TERM GOAL #2   Title Patient will reduce risk of falls as noted by ability to perform modified 5x sit to stand in 11 seconds or less.    Baseline 15.4 seconds    Time 6    Period Weeks    Status On-going      PT LONG TERM GOAL #3   Title Patient will reduce risk of falls as noted by Merrilee Jansky Balance Score of 50+/56.    Time 6    Period Weeks    Status On-going      PT LONG TERM GOAL #4   Title  Patient will demosntrate 4/5 or greater bilateral LE MMT to improve stability during functional tasks.    Baseline --    Time 6    Period Weeks    Status On-going                 Plan - 07/19/20 9326    Clinical Impression Statement Patient arrives to physical therapy feeling good and with improvements in balance and strength. Patient able to complete balance activities with intermittent UE support and good use of ankle strategies. Patient noted with slight discomfort in left hip with SLS but overall was able to tolerate exercise. No adverse affects upon removal of modalities.    Personal Factors and Comorbidities Comorbidity 2    Comorbidities arthritis, DM, osteoporosis    Examination-Activity Limitations Locomotion Level;Transfers;Stand    Stability/Clinical Decision Making Stable/Uncomplicated    Clinical Decision Making Low    Rehab Potential Good    PT Frequency 2x / week    PT Duration 6 weeks    PT Treatment/Interventions ADLs/Self Care Home Management;Cryotherapy;Electrical Stimulation;Moist Heat;Therapeutic activities;Therapeutic exercise;Functional mobility training;Patient/family education;Manual techniques;Gait training;Stair training;Balance training;Neuromuscular re-education;Passive range of motion    PT Next Visit Plan Nustep, LE strengthening, balance activities in various positions    PT Home Exercise Plan see patient education section.    Consulted and Agree with Plan of Care Patient           Patient will benefit from skilled therapeutic intervention in order to improve the following deficits and impairments:  Decreased activity tolerance, Pain, Decreased balance, Decreased strength, Difficulty walking, Abnormal gait, Postural dysfunction  Visit Diagnosis: Unsteadiness on feet  Repeated falls  Muscle weakness (generalized)     Problem List Patient Active Problem List   Diagnosis Date Noted  . Hypothyroidism 07/25/2019  . Chronic idiopathic  constipation 07/25/2019  . Chronic midline thoracic back pain 09/19/2018  . DDD (degenerative disc disease), lumbar 11/12/2017  . Osteoporosis 11/12/2017  . Special screening for malignant neoplasms, colon 05/07/2016  . Mixed hyperlipidemia 09/14/2014  . PVC's (premature ventricular contractions) 09/14/2014  . Type 2 diabetes mellitus ([place]) 08/22/2013  . Essential hypertension 08/22/2013  Gabriela Eves, PT, DPT 07/19/2020, 8:25 AM  [facility] [place], [place], [address] Phone: [phone]   Fax:  [phone]  Name: Lauren Shannon MRN: [id number] Date of Birth: 04-24-1956

## 2020-07-24 ENCOUNTER — Ambulatory Visit: Payer: BC Managed Care – PPO | Admitting: Family Medicine

## 2020-07-24 ENCOUNTER — Other Ambulatory Visit: Payer: Self-pay

## 2020-07-24 ENCOUNTER — Encounter: Payer: Self-pay | Admitting: Family Medicine

## 2020-07-24 ENCOUNTER — Ambulatory Visit: Payer: BC Managed Care – PPO | Admitting: Physical Therapy

## 2020-07-24 ENCOUNTER — Encounter: Payer: Self-pay | Admitting: Physical Therapy

## 2020-07-24 ENCOUNTER — Ambulatory Visit (INDEPENDENT_AMBULATORY_CARE_PROVIDER_SITE_OTHER): Payer: BC Managed Care – PPO

## 2020-07-24 VITALS — BP 113/66 | HR 73 | Temp 97.2°F | Resp 20 | Ht 65.0 in | Wt 204.1 lb

## 2020-07-24 DIAGNOSIS — R2681 Unsteadiness on feet: Secondary | ICD-10-CM

## 2020-07-24 DIAGNOSIS — R296 Repeated falls: Secondary | ICD-10-CM | POA: Diagnosis not present

## 2020-07-24 DIAGNOSIS — G4700 Insomnia, unspecified: Secondary | ICD-10-CM

## 2020-07-24 DIAGNOSIS — E119 Type 2 diabetes mellitus without complications: Secondary | ICD-10-CM | POA: Diagnosis not present

## 2020-07-24 DIAGNOSIS — G8929 Other chronic pain: Secondary | ICD-10-CM

## 2020-07-24 DIAGNOSIS — Z23 Encounter for immunization: Secondary | ICD-10-CM

## 2020-07-24 DIAGNOSIS — M810 Age-related osteoporosis without current pathological fracture: Secondary | ICD-10-CM

## 2020-07-24 DIAGNOSIS — M6281 Muscle weakness (generalized): Secondary | ICD-10-CM | POA: Diagnosis not present

## 2020-07-24 DIAGNOSIS — E039 Hypothyroidism, unspecified: Secondary | ICD-10-CM

## 2020-07-24 DIAGNOSIS — N3941 Urge incontinence: Secondary | ICD-10-CM

## 2020-07-24 DIAGNOSIS — M546 Pain in thoracic spine: Secondary | ICD-10-CM

## 2020-07-24 DIAGNOSIS — E782 Mixed hyperlipidemia: Secondary | ICD-10-CM

## 2020-07-24 DIAGNOSIS — I10 Essential (primary) hypertension: Secondary | ICD-10-CM

## 2020-07-24 MED ORDER — ATORVASTATIN CALCIUM 40 MG PO TABS: 40.0000 mg | ORAL_TABLET | Freq: Every day | ORAL | 0 refills | Status: DC

## 2020-07-24 MED ORDER — PREGABALIN 200 MG PO CAPS
ORAL_CAPSULE | ORAL | 0 refills | Status: DC
Start: 2020-07-24 — End: 2020-12-25

## 2020-07-24 MED ORDER — TRAZODONE HCL 150 MG PO TABS: ORAL_TABLET | ORAL | 0 refills | Status: DC

## 2020-07-24 MED ORDER — MIRABEGRON ER 50 MG PO TB24: 50.0000 mg | ORAL_TABLET | Freq: Every day | ORAL | 1 refills | Status: DC

## 2020-07-24 MED ORDER — TRULICITY 0.75 MG/0.5ML ~~LOC~~ SOAJ: 0.7500 mg | SUBCUTANEOUS | 2 refills | Status: DC

## 2020-07-24 MED ORDER — ALENDRONATE SODIUM 70 MG PO TABS: 70.0000 mg | ORAL_TABLET | ORAL | 11 refills | Status: DC

## 2020-07-24 MED ORDER — LEVOTHYROXINE SODIUM 50 MCG PO TABS: 50.0000 ug | ORAL_TABLET | Freq: Every day | ORAL | 0 refills | Status: DC

## 2020-07-24 NOTE — Therapy (Signed)
[facility] [place], [place], [address] Phone: [phone]   Fax:  [phone]  Physical Therapy Treatment  Patient Details  Name: Lauren Shannon MRN: [id number] Date of Birth: April 26, 1956 Referring Provider (PT): Caryl Pina, MD   Encounter Date: 07/24/2020   PT End of Session - 07/24/20 0755    Visit Number 9    Number of Visits 12    Date for PT Re-Evaluation 08/09/20    PT Start Time 0733    PT Stop Time 0815    PT Time Calculation (min) 42 min    Activity Tolerance Patient tolerated treatment well    Behavior During Therapy [facility] for tasks assessed/performed           Past Medical History:  Diagnosis Date  . Allergy   . Arthritis    ostoarthritis  . Basal cell carcinoma   . Essential hypertension    pt states not currently being treated for this  . Headache   . History of pneumonia 1963  . Low back pain   . Mixed hyperlipidemia   . Routine gynecological examination 08/22/2013  . Type 2 diabetes mellitus ([place])    pt stated not currently being treated for this    Past Surgical History:  Procedure Laterality Date  . APPENDECTOMY    . CHOLECYSTECTOMY    . COLONOSCOPY N/A 01/28/2018   Procedure: COLONOSCOPY;  Surgeon: Danie Binder, MD;  [facility];  Service: Endoscopy;  Laterality: N/A;  1:00  . PLANTAR FASCIA SURGERY Right 2012  . stimulation system implant  2015  . THYROID SURGERY  2005  . TONSILLECTOMY    . TUBAL LIGATION  1993  . Uterine polyp removal      There were no vitals filed for this visit.   Subjective Assessment - 07/24/20 0746    Subjective COVID-19 screening performed upon arrival. No new complaints.    Pertinent History DM, OA; OP; DDD. interstim bladder implanted device (by left glutes and R psis)    Limitations Walking;House hold activities    Diagnostic tests x-ray: normal    Patient Stated Goals improve balance, reduce falls    Currently in Pain? No/denies               [facility] PT Assessment - 07/24/20 0001      Assessment   Medical Diagnosis Balance Disorder, Recurrent falls    Referring Provider (PT) Caryl Pina, MD    Next MD Visit "a month"    Prior Therapy for back      Precautions   Precautions Fall                         [place] Adult PT Treatment/Exercise - 07/24/20 0001      Knee/Hip Exercises: Aerobic   Nustep L4 x12 min      Knee/Hip Exercises: Machines for Strengthening   Cybex Knee Extension 10# x30    Cybex Knee Flexion 20# x30    Cybex Leg Press 1 plate 3x10               Balance Exercises - 07/24/20 0001      Balance Exercises: Standing   Standing Eyes Opened Narrow base of support (BOS);Head turns;Foam/compliant surface;Time    Standing Eyes Opened Time x4 min    Tandem Stance Eyes open;Foam/compliant surface;Time    Tandem Stance Limitations semitandem    SLS Eyes open;Solid surface;Intermittent upper extremity support;2 reps;Time  SLS Time 1 min each    Tandem Gait Forward;Retro;Intermittent upper extremity support;Foam/compliant surface;5 reps    Sidestepping Foam/compliant support;5 reps                  PT Long Term Goals - 07/09/20 5809      PT LONG TERM GOAL #1   Title Patient will be independent with HEP and progression    Time 6    Period Weeks    Status Achieved      PT LONG TERM GOAL #2   Title Patient will reduce risk of falls as noted by ability to perform modified 5x sit to stand in 11 seconds or less.    Baseline 15.4 seconds    Time 6    Period Weeks    Status On-going      PT LONG TERM GOAL #3   Title Patient will reduce risk of falls as noted by Merrilee Jansky Balance Score of 50+/56.    Time 6    Period Weeks    Status On-going      PT LONG TERM GOAL #4   Title Patient will demosntrate 4/5 or greater bilateral LE MMT to improve stability during functional tasks.    Baseline --    Time 6    Period Weeks    Status On-going                  Plan - 07/24/20 9833    Clinical Impression Statement Patient presneted in clinic with reports of no new falls. Patient guided through light resistance strengthening. Patient guided through balance activities while on uneven surfaces with appropriate ankle strategies. Intermittant UE support required for uneven surfaces.    Personal Factors and Comorbidities Comorbidity 2    Comorbidities arthritis, DM, osteoporosis    Examination-Activity Limitations Locomotion Level;Transfers;Stand    Stability/Clinical Decision Making Stable/Uncomplicated    Rehab Potential Good    PT Frequency 2x / week    PT Duration 6 weeks    PT Treatment/Interventions ADLs/Self Care Home Management;Cryotherapy;Electrical Stimulation;Moist Heat;Therapeutic activities;Therapeutic exercise;Functional mobility training;Patient/family education;Manual techniques;Gait training;Stair training;Balance training;Neuromuscular re-education;Passive range of motion    PT Next Visit Plan Nustep, LE strengthening, balance activities in various positions    PT Home Exercise Plan see patient education section.    Consulted and Agree with Plan of Care Patient           Patient will benefit from skilled therapeutic intervention in order to improve the following deficits and impairments:  Decreased activity tolerance, Pain, Decreased balance, Decreased strength, Difficulty walking, Abnormal gait, Postural dysfunction  Visit Diagnosis: Unsteadiness on feet  Repeated falls  Muscle weakness (generalized)     Problem List Patient Active Problem List   Diagnosis Date Noted  . Hypothyroidism 07/25/2019  . Chronic idiopathic constipation 07/25/2019  . Chronic midline thoracic back pain 09/19/2018  . DDD (degenerative disc disease), lumbar 11/12/2017  . Osteoporosis 11/12/2017  . Special screening for malignant neoplasms, colon 05/07/2016  . Mixed hyperlipidemia 09/14/2014  . PVC's (premature ventricular  contractions) 09/14/2014  . Type 2 diabetes mellitus ([place]) 08/22/2013  . Essential hypertension 08/22/2013    Standley Brooking, PTA 07/24/2020, 8:32 AM  [facility] [address] [place], [place], [address] Phone: [phone]   Fax:  [phone]  Name: Lauren Shannon MRN: [id number] Date of Birth: August 13, 1956

## 2020-07-28 NOTE — Progress Notes (Signed)
Subjective:  Patient ID: Lauren Shannon,  female    DOB: Mar 13, 1956  Age: 64 y.o.    CC: Medical Management of Chronic Issues   HPI Lauren Shannon presents for  follow-up of hypertension. Patient has no history of headache chest pain or shortness of breath or recent cough. Patient also denies symptoms of TIA such as numbness weakness lateralizing. Patient denies side effects from medication. States taking it regularly.  Patient also  in for follow-up of elevated cholesterol. Doing well without complaints on current medication. Denies side effects  including myalgia and arthralgia and nausea. Also in today for liver function testing. Currently no chest pain, shortness of breath or other cardiovascular related symptoms noted.  Follow-up of diabetes.She is having trouble controlling her appetite.  Patient denies symptoms such asurinary frequency, polydipsia, nausea No significant hypoglycemic spells noted. Medications reviewed. Pt reports taking them regularly. Pt. denies complication/adverse reaction today.   Depression screen [facility] 2/9 07/24/2020 07/24/2020 06/05/2020 05/07/2020 02/29/2020  Decreased Interest _0 0 0  Down, Depressed, Hopeless _1 0 0  PHQ - 2 Score _2 0 0  Altered sleeping 0 - 0 - -  Tired, decreased energy 3 - 1 - -  Change in appetite 1 - 0 - -  Feeling bad or failure about yourself  0 - 0 - -  Trouble concentrating 2 - 0 - -  Moving slowly or fidgety/restless 0 - 0 - -  Suicidal thoughts 0 - 0 - -  PHQ-9 Score 10 - 3 - -  Some recent data might be hidden    History Lauren Shannon has a past medical history of Allergy, Arthritis, Basal cell carcinoma, Essential hypertension, Headache, History of pneumonia (1963), Low back pain, Mixed hyperlipidemia, Routine gynecological examination (08/22/2013), and Type 2 diabetes mellitus ([place]).   She has a past surgical history that includes Thyroid surgery (2005); Uterine polyp removal; Tubal ligation (1993); Cholecystectomy;  Appendectomy; Tonsillectomy; Plantar fascia surgery (Right, 2012); stimulation system implant (2015); and Colonoscopy (N/A, 01/28/2018).   Her family history includes Diabetes in her mother; Heart disease in her brother and father; Hypertension in her mother.She reports that she has never smoked. She has never used smokeless tobacco. She reports current alcohol use. She reports that she does not use drugs.  Current Outpatient Medications on File Prior to Visit  Medication Sig Dispense Refill  . aspirin 81 MG tablet Take 81 mg by mouth every evening.     . blood glucose meter kit and supplies KIT Dispense based on patient and insurance preference. Use up to four times daily as directed. (FOR ICD-10 : E11.9 1 each 0  . Calcium Citrate-Vitamin D (CALCIUM + D PO) Take 1 tablet by mouth daily.    . Multiple Vitamin (MULTIVITAMIN) capsule Take 1 capsule by mouth daily.    . vitamin B-12 (CYANOCOBALAMIN) 1000 MCG tablet Take 2,000 mcg by mouth daily.     No current facility-administered medications on file prior to visit.    ROS Review of Systems  Constitutional: Negative.   HENT: Negative.   Eyes: Negative for visual disturbance.  Respiratory: Negative for shortness of breath.   Cardiovascular: Negative for chest pain.  Gastrointestinal: Negative for abdominal pain.  Musculoskeletal: Negative for arthralgias.    Objective:  BP 113/66   Pulse 73   Temp (!) 97.2 F (36.2 C) (Temporal)   Resp 20   Ht _3  (1.651 m)   Wt 204 lb 2 oz (92.6 kg)  SpO2 100%   BMI 33.97 kg/m   BP Readings from Last 3 Encounters:  07/24/20 113/66  06/05/20 112/81  05/07/20 120/76    Wt Readings from Last 3 Encounters:  07/24/20 204 lb 2 oz (92.6 kg)  06/05/20 201 lb (91.2 kg)  05/07/20 199 lb 6.4 oz (90.4 kg)     Physical Exam Constitutional:      General: She is not in acute distress.    Appearance: She is well-developed.  HENT:     Head: Normocephalic and atraumatic.  Eyes:      Conjunctiva/sclera: Conjunctivae normal.     Pupils: Pupils are equal, round, and reactive to light.  Neck:     Thyroid: No thyromegaly.  Cardiovascular:     Rate and Rhythm: Normal rate and regular rhythm.     Heart sounds: Normal heart sounds. No murmur heard.   Pulmonary:     Effort: Pulmonary effort is normal. No respiratory distress.     Breath sounds: Normal breath sounds. No wheezing or rales.  Abdominal:     General: Bowel sounds are normal. There is no distension.     Palpations: Abdomen is soft.     Tenderness: There is no abdominal tenderness.  Musculoskeletal:        General: Normal range of motion.     Cervical back: Normal range of motion and neck supple.  Lymphadenopathy:     Cervical: No cervical adenopathy.  Skin:    General: Skin is warm and dry.  Neurological:     Mental Status: She is alert and oriented to person, place, and time.  Psychiatric:        Behavior: Behavior normal.        Thought Content: Thought content normal.        Judgment: Judgment normal.     Diabetic Foot Exam - Simple   No data filed        Assessment & Plan:   Lauren Shannon was seen today for medical management of chronic issues.  Diagnoses and all orders for this visit:  Type 2 diabetes mellitus without complication, without long-term current use of insulin (HCC) -     atorvastatin (LIPITOR) 40 MG tablet; Take 1 tablet (40 mg total) by mouth daily. -     Dulaglutide (TRULICITY) 8.09 XI/3.3AS SOPN; Inject 0.75 mg into the skin once a week.  Insomnia, unspecified type -     traZODone (DESYREL) 150 MG tablet; Take 1 to 2 tablets at bedtime for sleep  Urge incontinence -     mirabegron ER (MYRBETRIQ) 50 MG TB24 tablet; Take 1 tablet (50 mg total) by mouth daily.  Need for immunization against influenza -     Flu Vaccine QUAD 36+ mos IM  Essential hypertension  Mixed hyperlipidemia -     atorvastatin (LIPITOR) 40 MG tablet; Take 1 tablet (40 mg total) by mouth  daily.  Hypothyroidism, unspecified type -     levothyroxine (SYNTHROID) 50 MCG tablet; Take 1 tablet (50 mcg total) by mouth daily.  Osteoporosis, unspecified osteoporosis type, unspecified pathological fracture presence -     alendronate (FOSAMAX) 70 MG tablet; Take 1 tablet (70 mg total) by mouth every 7 (seven) days. Take with a full glass of water on an empty stomach. Do not lay down for at least 2 hours -     DG Bone Density; Future  Chronic midline thoracic back pain  Other orders -     pregabalin (LYRICA) 200 MG capsule; TAKE ONE  TABLET AT BEDTIME   I have discontinued Lauren Shannon's risedronate. I have also changed her levothyroxine and atorvastatin. Additionally, I am having her start on Trulicity and alendronate. Lastly, I am having her maintain her multivitamin, aspirin, Calcium Citrate-Vitamin D (CALCIUM + D PO), blood glucose meter kit and supplies, vitamin B-12, traZODone, pregabalin, and mirabegron ER.  Meds ordered this encounter  Medications  . traZODone (DESYREL) 150 MG tablet    Sig: Take 1 to 2 tablets at bedtime for sleep    Dispense:  180 tablet    Refill:  0  . pregabalin (LYRICA) 200 MG capsule    Sig: TAKE ONE TABLET AT BEDTIME    Dispense:  90 capsule    Refill:  0  . mirabegron ER (MYRBETRIQ) 50 MG TB24 tablet    Sig: Take 1 tablet (50 mg total) by mouth daily.    Dispense:  90 tablet    Refill:  1  . levothyroxine (SYNTHROID) 50 MCG tablet    Sig: Take 1 tablet (50 mcg total) by mouth daily.    Dispense:  90 tablet    Refill:  0  . atorvastatin (LIPITOR) 40 MG tablet    Sig: Take 1 tablet (40 mg total) by mouth daily.    Dispense:  90 tablet    Refill:  0  . Dulaglutide (TRULICITY) 6.63 YO/4.5TX SOPN    Sig: Inject 0.75 mg into the skin once a week.    Dispense:  2 mL    Refill:  2  . alendronate (FOSAMAX) 70 MG tablet    Sig: Take 1 tablet (70 mg total) by mouth every 7 (seven) days. Take with a full glass of water on an empty stomach. Do not  lay down for at least 2 hours    Dispense:  4 tablet    Refill:  11   Pt. Educated about proper use of trulicity by injection for DM  Control. First injection administered here.  Follow-up: Return in about 6 weeks (around 09/04/2020).  [name], M.D.

## 2020-07-31 ENCOUNTER — Encounter: Payer: Self-pay | Admitting: Physical Therapy

## 2020-07-31 ENCOUNTER — Ambulatory Visit: Payer: BC Managed Care – PPO | Attending: Family Medicine | Admitting: Physical Therapy

## 2020-07-31 ENCOUNTER — Other Ambulatory Visit: Payer: Self-pay

## 2020-07-31 DIAGNOSIS — R2681 Unsteadiness on feet: Secondary | ICD-10-CM | POA: Insufficient documentation

## 2020-07-31 DIAGNOSIS — R296 Repeated falls: Secondary | ICD-10-CM | POA: Diagnosis not present

## 2020-07-31 DIAGNOSIS — M6281 Muscle weakness (generalized): Secondary | ICD-10-CM | POA: Diagnosis not present

## 2020-07-31 NOTE — Therapy (Signed)
[facility] [place], [place], [address] Phone: [phone]   Fax:  [phone]  Physical Therapy Treatment  Progress Note Reporting Period 06/21/2020 to 07/31/2020  See note below for Objective Data and Assessment of Progress/Goals. Patient responding well to therapy with improvements with function and improved balance.     Patient Details  Name: Lauren Shannon MRN: [id number] Date of Birth: 1955-10-12 Referring Provider (PT): Caryl Pina, MD   Encounter Date: 07/31/2020   PT End of Session - 07/31/20 0746    Visit Number 10    Number of Visits 12    Date for PT Re-Evaluation 08/09/20    PT Start Time 0730    PT Stop Time 0816    PT Time Calculation (min) 46 min    Activity Tolerance Patient tolerated treatment well    Behavior During Therapy [facility] for tasks assessed/performed           Past Medical History:  Diagnosis Date  . Allergy   . Arthritis    ostoarthritis  . Basal cell carcinoma   . Essential hypertension    pt states not currently being treated for this  . Headache   . History of pneumonia 1963  . Low back pain   . Mixed hyperlipidemia   . Routine gynecological examination 08/22/2013  . Type 2 diabetes mellitus ([place])    pt stated not currently being treated for this    Past Surgical History:  Procedure Laterality Date  . APPENDECTOMY    . CHOLECYSTECTOMY    . COLONOSCOPY N/A 01/28/2018   Procedure: COLONOSCOPY;  Surgeon: Danie Binder, MD;  [facility];  Service: Endoscopy;  Laterality: N/A;  1:00  . PLANTAR FASCIA SURGERY Right 2012  . stimulation system implant  2015  . THYROID SURGERY  2005  . TONSILLECTOMY    . TUBAL LIGATION  1993  . Uterine polyp removal      There were no vitals filed for this visit.   Subjective Assessment - 07/31/20 0746    Subjective COVID-19 screening performed upon arrival. No new complaints.    Pertinent History DM, OA; OP; DDD.  interstim bladder implanted device (by left glutes and R psis)    Limitations Walking;House hold activities    Diagnostic tests x-ray: normal    Patient Stated Goals improve balance, reduce falls    Currently in Pain? No/denies              [facility] PT Assessment - 07/31/20 0001      Assessment   Medical Diagnosis Balance Disorder, Recurrent falls    Referring Provider (PT) Caryl Pina, MD    Next MD Visit "a month"    Prior Therapy for back      Precautions   Precautions [place] [facility] Adult PT Treatment/Exercise - 07/31/20 0001      Knee/Hip Exercises: Aerobic   Nustep L4 x12 min      Knee/Hip Exercises: Machines for Strengthening   Cybex Knee Extension 20# x30    Cybex Knee Flexion 30# x30    Cybex Leg Press --      Knee/Hip Exercises: Standing   Walking with Sports Cord 3D Orange XTS x5 reps each               Balance Exercises - 07/31/20 0001  Balance Exercises: Standing   Standing Eyes Opened Narrow base of support (BOS);Head turns;Foam/compliant surface;Time    Standing Eyes Opened Limitations with 4# ball D2    Tandem Stance Eyes open;Foam/compliant surface;Time   x2 min   Tandem Stance Limitations semitandem    Standing, One Foot on a Step Eyes open;6 inch;1 rep;Time    Standing, One Foot on a Step Time x2 min each    Sidestepping Foam/compliant support;5 reps    Heel Raises Both;20 reps    Toe Raise Both;20 reps                  PT Long Term Goals - 07/09/20 8099      PT LONG TERM GOAL #1   Title Patient will be independent with HEP and progression    Time 6    Period Weeks    Status Achieved      PT LONG TERM GOAL #2   Title Patient will reduce risk of falls as noted by ability to perform modified 5x sit to stand in 11 seconds or less.    Baseline 15.4 seconds    Time 6    Period Weeks    Status On-going      PT LONG TERM GOAL #3   Title Patient will reduce risk of falls as noted by  Merrilee Jansky Balance Score of 50+/56.    Time 6    Period Weeks    Status On-going      PT LONG TERM GOAL #4   Title Patient will demosntrate 4/5 or greater bilateral LE MMT to improve stability during functional tasks.    Baseline --    Time 6    Period Weeks    Status On-going                 Plan - 07/31/20 0859    Clinical Impression Statement Patient presented in clinic with no new complaints of falls or pain. Patient able to stabilize better on uneven surfaces with balance activities. RLE simulated SLS restricted due to R hip discomfort.    Personal Factors and Comorbidities Comorbidity 2    Comorbidities arthritis, DM, osteoporosis    Examination-Activity Limitations Locomotion Level;Transfers;Stand    Stability/Clinical Decision Making Stable/Uncomplicated    Rehab Potential Good    PT Frequency 2x / week    PT Duration 6 weeks    PT Treatment/Interventions ADLs/Self Care Home Management;Cryotherapy;Electrical Stimulation;Moist Heat;Therapeutic activities;Therapeutic exercise;Functional mobility training;Patient/family education;Manual techniques;Gait training;Stair training;Balance training;Neuromuscular re-education;Passive range of motion    PT Next Visit Plan BERG score, sit stand assessement for goals.    PT Home Exercise Plan see patient education section.    Consulted and Agree with Plan of Care Patient           Patient will benefit from skilled therapeutic intervention in order to improve the following deficits and impairments:  Decreased activity tolerance, Pain, Decreased balance, Decreased strength, Difficulty walking, Abnormal gait, Postural dysfunction  Visit Diagnosis: Unsteadiness on feet  Repeated falls  Muscle weakness (generalized)     Problem List Patient Active Problem List   Diagnosis Date Noted  . Hypothyroidism 07/25/2019  . Chronic idiopathic constipation 07/25/2019  . Chronic midline thoracic back pain 09/19/2018  . DDD (degenerative  disc disease), lumbar 11/12/2017  . Osteoporosis 11/12/2017  . Mixed hyperlipidemia 09/14/2014  . PVC's (premature ventricular contractions) 09/14/2014  . Type 2 diabetes mellitus ([place]) 08/22/2013  . Essential hypertension 08/22/2013    Standley Brooking, PTA 07/31/2020,  9:14 AM  [facility] [place], [place], [address] Phone: [phone]   Fax:  [phone]  Name: Lauren Shannon MRN: [id number] Date of Birth: [birth year]

## 2020-08-02 ENCOUNTER — Ambulatory Visit: Payer: BC Managed Care – PPO | Admitting: Physical Therapy

## 2020-08-02 ENCOUNTER — Other Ambulatory Visit: Payer: Self-pay

## 2020-08-02 ENCOUNTER — Encounter: Payer: Self-pay | Admitting: Physical Therapy

## 2020-08-02 DIAGNOSIS — M6281 Muscle weakness (generalized): Secondary | ICD-10-CM

## 2020-08-02 DIAGNOSIS — R296 Repeated falls: Secondary | ICD-10-CM | POA: Diagnosis not present

## 2020-08-02 DIAGNOSIS — R2681 Unsteadiness on feet: Secondary | ICD-10-CM

## 2020-08-02 NOTE — Therapy (Signed)
[facility] [place], [place], [address] Phone: [phone]   Fax:  [phone]  Physical Therapy Treatment  Patient Details  Name: Joycelin Radloff MRN: [id number] Date of Birth: [birth year] Referring Provider (PT): Caryl Pina, MD   Encounter Date: 08/02/2020   PT End of Session - 08/02/20 0736    Visit Number 11    Number of Visits 12    Date for PT Re-Evaluation 08/09/20    PT Start Time 0731    PT Stop Time 0814    PT Time Calculation (min) 43 min    Activity Tolerance Patient tolerated treatment well    Behavior During Therapy [facility] for tasks assessed/performed           Past Medical History:  Diagnosis Date  . Allergy   . Arthritis    ostoarthritis  . Basal cell carcinoma   . Essential hypertension    pt states not currently being treated for this  . Headache   . History of pneumonia 1963  . Low back pain   . Mixed hyperlipidemia   . Routine gynecological examination 08/22/2013  . Type 2 diabetes mellitus ([place])    pt stated not currently being treated for this    Past Surgical History:  Procedure Laterality Date  . APPENDECTOMY    . CHOLECYSTECTOMY    . COLONOSCOPY N/A 01/28/2018   Procedure: COLONOSCOPY;  Surgeon: Danie Binder, MD;  [facility];  Service: Endoscopy;  Laterality: N/A;  1:00  . PLANTAR FASCIA SURGERY Right 2012  . stimulation system implant  2015  . THYROID SURGERY  2005  . TONSILLECTOMY    . TUBAL LIGATION  1993  . Uterine polyp removal      There were no vitals filed for this visit.   Subjective Assessment - 08/02/20 0736    Subjective COVID-19 screening performed upon arrival. No new complaints.    Pertinent History DM, OA; OP; DDD. interstim bladder implanted device (by left glutes and R psis)    Limitations Walking;House hold activities    Diagnostic tests x-ray: normal    Patient Stated Goals improve balance, reduce falls    Currently in Pain? No/denies                [facility] PT Assessment - 08/02/20 0001      Assessment   Medical Diagnosis Balance Disorder, Recurrent falls    Referring Provider (PT) Caryl Pina, MD    Next MD Visit "a month"    Prior Therapy for back      Precautions   Precautions Fall                         [facility] Adult PT Treatment/Exercise - 08/02/20 0001      Standardized Balance Assessment   Standardized Balance Assessment Berg Balance Test      Berg Balance Test   Sit to Stand Able to stand without using hands and stabilize independently    Standing Unsupported Able to stand safely 2 minutes    Sitting with Back Unsupported but Feet Supported on Floor or Stool Able to sit safely and securely 2 minutes    Stand to Sit Sits safely with minimal use of hands    Transfers Able to transfer safely, minor use of hands    Standing Unsupported with Eyes Closed Able to stand 10 seconds safely    Standing Ubsupported with Feet Together Able to place feet  together independently and stand 1 minute safely    From Standing, Reach Forward with Outstretched Arm Can reach confidently >25 cm (10")    From Standing Position, Pick up Object from [place] to pick up shoe safely and easily    From Standing Position, Turn to Look Behind Over each Shoulder Looks behind one side only/other side shows less weight shift    Turn 360 Degrees Able to turn 360 degrees safely in 4 seconds or less    Standing Unsupported, Alternately Place Feet on Step/Stool Able to stand independently and safely and complete 8 steps in 20 seconds    Standing Unsupported, One Foot in [place] to place foot tandem independently and hold 30 seconds    Standing on One Leg Able to lift leg independently and hold > 10 seconds    Total Score 55      Lumbar Exercises: Seated   Sit to Stand 10 reps   for goal assessment; x2 trials     Knee/Hip Exercises: Aerobic   Nustep L4 x12 min               Balance Exercises - 08/02/20 0001       Balance Exercises: Standing   Standing Eyes Opened Narrow base of support (BOS);Foam/compliant surface;Time;Cognitive challenge    Standing Eyes Opened Time x4 min on inverted BOSU    Tandem Stance Eyes open;Foam/compliant surface;Time;Cognitive challenge    Tandem Stance Limitations x4 min    Other Standing Exercises DLS on horizontal beam for reaching x4 min                  PT Long Term Goals - 08/02/20 0746      PT LONG TERM GOAL #1   Title Patient will be independent with HEP and progression    Time 6    Period Weeks    Status Achieved      PT LONG TERM GOAL #2   Title Patient will reduce risk of falls as noted by ability to perform modified 5x sit to stand in 11 seconds or less.    Baseline 15.4 seconds    Time 6    Period Weeks    Status On-going   12 sec 08/02/2020     PT LONG TERM GOAL #3   Title Patient will reduce risk of falls as noted by Merrilee Jansky Balance Score of 50+/56.    Time 6    Period Weeks    Status Achieved      PT LONG TERM GOAL #4   Title Patient will demosntrate 4/5 or greater bilateral LE MMT to improve stability during functional tasks.    Baseline --    Time 6    Period Weeks    Status On-going                 Plan - 08/02/20 0825    Clinical Impression Statement Patient presented in clinic with no new complaints. Patient able to achieve a great BERG score of 55/56. Patient has some limitation with L rotation due to prior history. Patient able to tolerate uneven surfaces with cognitive challenges well with appropriate ankle strategy.    Personal Factors and Comorbidities Comorbidity 2    Comorbidities arthritis, DM, osteoporosis    Examination-Activity Limitations Locomotion Level;Transfers;Stand    Stability/Clinical Decision Making Stable/Uncomplicated    Rehab Potential Good    PT Frequency 2x / week    PT Duration 6 weeks    PT Treatment/Interventions  ADLs/Self Care Home Management;Cryotherapy;Electrical Stimulation;Moist  Heat;Therapeutic activities;Therapeutic exercise;Functional mobility training;Patient/family education;Manual techniques;Gait training;Stair training;Balance training;Neuromuscular re-education;Passive range of motion    PT Next Visit Plan Goal assessment for D/C.    PT Home Exercise Plan see patient education section.    Consulted and Agree with Plan of Care Patient           Patient will benefit from skilled therapeutic intervention in order to improve the following deficits and impairments:  Decreased activity tolerance, Pain, Decreased balance, Decreased strength, Difficulty walking, Abnormal gait, Postural dysfunction  Visit Diagnosis: Unsteadiness on feet  Repeated falls  Muscle weakness (generalized)     Problem List Patient Active Problem List   Diagnosis Date Noted  . Hypothyroidism 07/25/2019  . Chronic idiopathic constipation 07/25/2019  . Chronic midline thoracic back pain 09/19/2018  . DDD (degenerative disc disease), lumbar 11/12/2017  . Osteoporosis 11/12/2017  . Mixed hyperlipidemia 09/14/2014  . PVC's (premature ventricular contractions) 09/14/2014  . Type 2 diabetes mellitus ([place]) 08/22/2013  . Essential hypertension 08/22/2013    Standley Brooking, PTA 08/02/2020, 8:42 AM  [facility] [address] [place], [place], [address] Phone: [phone]   Fax:  [phone]  Name: Shawntia Mangal MRN: [id number] Date of Birth: [birth year]

## 2020-08-08 ENCOUNTER — Telehealth: Payer: Self-pay

## 2020-08-08 ENCOUNTER — Encounter: Payer: Self-pay | Admitting: Family Medicine

## 2020-08-08 NOTE — Telephone Encounter (Signed)
Key: [id number] Need help? Call us at [phone]  Outcome Approvedtoday Effective from 08/08/2020 through 08/07/2021.  Drug Trulicity 0.75MG /0.5ML pen-injectors  Approval faxed to [facility]

## 2020-08-09 ENCOUNTER — Other Ambulatory Visit: Payer: Self-pay

## 2020-08-09 ENCOUNTER — Ambulatory Visit: Payer: BC Managed Care – PPO | Admitting: Physical Therapy

## 2020-08-09 ENCOUNTER — Encounter: Payer: Self-pay | Admitting: Physical Therapy

## 2020-08-09 DIAGNOSIS — R2681 Unsteadiness on feet: Secondary | ICD-10-CM | POA: Diagnosis not present

## 2020-08-09 DIAGNOSIS — R296 Repeated falls: Secondary | ICD-10-CM | POA: Diagnosis not present

## 2020-08-09 DIAGNOSIS — M6281 Muscle weakness (generalized): Secondary | ICD-10-CM | POA: Diagnosis not present

## 2020-08-09 NOTE — Therapy (Signed)
[facility] [place], [place], [address] Phone: [phone]   Fax:  [phone]  Physical Therapy Treatment PHYSICAL THERAPY DISCHARGE SUMMARY  Visits from Start of Care: 12  Current functional level related to goals / functional outcomes: See below   Remaining deficits: All goals met   Education / Equipment: HEP Plan: Patient agrees to discharge.  Patient goals were met. Patient is being discharged due to meeting the stated rehab goals.  ?????    Gabriela Eves, PT, DPT 08/09/20   Patient Details  Name: Lauren Shannon MRN: [id number] Date of Birth: 01/22/1956 Referring Provider (PT): Caryl Pina, MD   Encounter Date: 08/09/2020   PT End of Session - 08/09/20 0734    Visit Number 12    Number of Visits 12    Date for PT Re-Evaluation 08/09/20    PT Start Time 0732    PT Stop Time 0814    PT Time Calculation (min) 42 min    Activity Tolerance Patient tolerated treatment well    Behavior During Therapy [facility] for tasks assessed/performed           Past Medical History:  Diagnosis Date  . Allergy   . Arthritis    ostoarthritis  . Basal cell carcinoma   . Essential hypertension    pt states not currently being treated for this  . Headache   . History of pneumonia 1963  . Low back pain   . Mixed hyperlipidemia   . Routine gynecological examination 08/22/2013  . Type 2 diabetes mellitus ([place])    pt stated not currently being treated for this    Past Surgical History:  Procedure Laterality Date  . APPENDECTOMY    . CHOLECYSTECTOMY    . COLONOSCOPY N/A 01/28/2018   Procedure: COLONOSCOPY;  Surgeon: Danie Binder, MD;  [facility];  Service: Endoscopy;  Laterality: N/A;  1:00  . PLANTAR FASCIA SURGERY Right 2012  . stimulation system implant  2015  . THYROID SURGERY  2005  . TONSILLECTOMY    . TUBAL LIGATION  1993  . Uterine polyp removal      There were no vitals filed for  this visit.   Subjective Assessment - 08/09/20 0734    Subjective COVID-19 screening performed upon arrival. No new complaints.    Pertinent History DM, OA; OP; DDD. interstim bladder implanted device (by left glutes and R psis)    Limitations Walking;House hold activities    Diagnostic tests x-ray: normal    Patient Stated Goals improve balance, reduce falls    Currently in Pain? No/denies              [facility] PT Assessment - 08/09/20 0001      Assessment   Medical Diagnosis Balance Disorder, Recurrent falls    Referring Provider (PT) Caryl Pina, MD    Next MD Visit "a month"    Prior Therapy for back      Precautions   Precautions Fall      Restrictions   Weight Bearing Restrictions No      ROM / Strength   AROM / PROM / Strength Strength      Strength   Strength Assessment Site Hip;Knee;Ankle    Right/Left Hip Right;Left    Right Hip Flexion 4/5    Left Hip Flexion 4/5    Right/Left Knee Right;Left    Right Knee Flexion 4+/5    Right Knee Extension 4+/5    Left Knee Flexion  4+/5    Left Knee Extension 4+/5    Right/Left Ankle Right;Left    Right Ankle Dorsiflexion 5/5    Right Ankle Plantar Flexion 5/5    Left Ankle Dorsiflexion 5/5    Left Ankle Plantar Flexion 5/5                         [facility] Adult PT Treatment/Exercise - 08/09/20 0001      Knee/Hip Exercises: Aerobic   Nustep L4 x12 min      Knee/Hip Exercises: Machines for Strengthening   Cybex Knee Extension 20# x20    Cybex Knee Flexion 30# x20      Knee/Hip Exercises: Seated   Sit to Sand 5 reps;without UE support   for goal achievement              Balance Exercises - 08/09/20 0001      Balance Exercises: Standing   SLS Eyes open;Solid surface;5 reps;Cognitive challenge   forward alternating taps; side to side   SLS with Vectors Solid surface;Time    SLS with Vectors Time x3 min total; forward, B side balance pod touches    Sidestepping 2 reps    Cone Rotation  Foam/compliant surface;R/L;Limitations    Cone Rotation Limitations different heights x2    Heel Raises Both;15 reps   with glute squeeze   Toe Raise Both;15 reps   with glute squeeze   Sit to Stand Standard surface;Without upper extremity support;Foam/compliant surface   x10 reps   Other Standing Exercises [place] x 1 RT                  PT Long Term Goals - 08/09/20 0748      PT LONG TERM GOAL #1   Title Patient will be independent with HEP and progression    Time 6    Period Weeks    Status Achieved      PT LONG TERM GOAL #2   Title Patient will reduce risk of falls as noted by ability to perform modified 5x sit to stand in 11 seconds or less.    Baseline 15.4 seconds    Time 6    Period Weeks    Status Achieved   12 sec 08/02/2020     PT LONG TERM GOAL #3   Title Patient will reduce risk of falls as noted by Merrilee Jansky Balance Score of 50+/56.    Time 6    Period Weeks    Status Achieved      PT LONG TERM GOAL #4   Title Patient will demosntrate 4/5 or greater bilateral LE MMT to improve stability during functional tasks.    Baseline --    Time 6    Period Weeks    Status Achieved                 Plan - 08/09/20 4742    Clinical Impression Statement Patient has tolerated PT very well with achieval of all goals. Patient's LE strength has improved as well as patient's ability to self stabilize with any balance wavering. Patient encouraged to utilize core activation and glute squeeze to assist with stabilization. Patient reports not having but two rugs in her home.    Personal Factors and Comorbidities Comorbidity 2    Comorbidities arthritis, DM, osteoporosis    Examination-Activity Limitations Locomotion Level;Transfers;Stand    Stability/Clinical Decision Making Stable/Uncomplicated    Rehab Potential Good    PT Frequency 2x /  week    PT Duration 6 weeks    PT Treatment/Interventions ADLs/Self Care Home Management;Cryotherapy;Electrical Stimulation;Moist  Heat;Therapeutic activities;Therapeutic exercise;Functional mobility training;Patient/family education;Manual techniques;Gait training;Stair training;Balance training;Neuromuscular re-education;Passive range of motion    PT Next Visit Plan D/C summary.    PT Home Exercise Plan see patient education section.    Consulted and Agree with Plan of Care Patient           Patient will benefit from skilled therapeutic intervention in order to improve the following deficits and impairments:  Decreased activity tolerance,Pain,Decreased balance,Decreased strength,Difficulty walking,Abnormal gait,Postural dysfunction  Visit Diagnosis: Unsteadiness on feet  Repeated falls  Muscle weakness (generalized)     Problem List Patient Active Problem List   Diagnosis Date Noted  . Hypothyroidism 07/25/2019  . Chronic idiopathic constipation 07/25/2019  . Chronic midline thoracic back pain 09/19/2018  . DDD (degenerative disc disease), lumbar 11/12/2017  . Osteoporosis 11/12/2017  . Mixed hyperlipidemia 09/14/2014  . PVC's (premature ventricular contractions) 09/14/2014  . Type 2 diabetes mellitus ([place]) 08/22/2013  . Essential hypertension 08/22/2013   Standley Brooking, PTA 08/09/20 8:38 AM   [facility] [address] [place], [place], [address] Phone: [phone]   Fax:  [phone]  Name: Lauren Shannon MRN: [id number] Date of Birth: 1955-09-19

## 2020-08-16 DIAGNOSIS — R350 Frequency of micturition: Secondary | ICD-10-CM | POA: Diagnosis not present

## 2020-08-16 DIAGNOSIS — N3941 Urge incontinence: Secondary | ICD-10-CM | POA: Diagnosis not present

## 2020-08-16 DIAGNOSIS — R8271 Bacteriuria: Secondary | ICD-10-CM | POA: Diagnosis not present

## 2020-08-31 DIAGNOSIS — U071 COVID-19: Secondary | ICD-10-CM

## 2020-08-31 HISTORY — DX: COVID-19: U07.1

## 2020-09-03 ENCOUNTER — Ambulatory Visit: Payer: BC Managed Care – PPO | Admitting: Family Medicine

## 2020-09-05 DIAGNOSIS — N3941 Urge incontinence: Secondary | ICD-10-CM | POA: Diagnosis not present

## 2020-09-05 DIAGNOSIS — R351 Nocturia: Secondary | ICD-10-CM | POA: Diagnosis not present

## 2020-09-09 ENCOUNTER — Ambulatory Visit (INDEPENDENT_AMBULATORY_CARE_PROVIDER_SITE_OTHER): Payer: BC Managed Care – PPO | Admitting: Nurse Practitioner

## 2020-09-09 ENCOUNTER — Encounter: Payer: Self-pay | Admitting: Nurse Practitioner

## 2020-09-09 DIAGNOSIS — J00 Acute nasopharyngitis [common cold]: Secondary | ICD-10-CM | POA: Diagnosis not present

## 2020-09-09 NOTE — Progress Notes (Signed)
Virtual Visit via telephone Note Due to COVID-19 pandemic this visit was conducted virtually. This visit type was conducted due to national recommendations for restrictions regarding the COVID-19 Pandemic (e.g. social distancing, sheltering in place) in an effort to limit this patient's exposure and mitigate transmission in our community. All issues noted in this document were discussed and addressed.  A physical exam was not performed with this format.  I connected with Lauren Shannon on 09/09/20 at 9:40 by telephone and verified that I am speaking with the correct person using two identifiers. Lauren Shannon is currently located at hime and no one is currently with her during visit. The provider, Mary-Margaret Hassell Done, FNP is located in their office at time of visit.  I discussed the limitations, risks, security and privacy concerns of performing an evaluation and management service by telephone and the availability of in person appointments. I also discussed with the patient that there may be a patient responsible charge related to this service. The patient expressed understanding and agreed to proceed.   History and Present Illness:   Chief Complaint: Covid Exposure   HPI Patient having a telephone visit for possible covid. She developed congestion, runy nose, sore throat  And cough. Her neighbor tested positive for covid Saturday and she has been around her.   Review of Systems  Constitutional: Negative for chills, fever and malaise/fatigue.  HENT: Positive for congestion and sore throat. Negative for sinus pain.   Respiratory: Positive for cough and sputum production.   Gastrointestinal: Negative for nausea and vomiting.  Musculoskeletal: Negative for myalgias.  Neurological: Negative for dizziness and headaches.  All other systems reviewed and are negative.    Observations/Objective: Alert and oriented- answers all questions appropriately No distress    Assessment and  Plan: Lauren Shannon in today with chief complaint of Covid Exposure   1. Acute nasopharyngitis 1. Take meds as prescribed 2. Use a cool mist humidifier especially during the winter months and when heat has been humid. 3. Use saline nose sprays frequently 4. Saline irrigations of the nose can be very helpful if done frequently.  * 4X daily for 1 week*  * Use of a nettie pot can be helpful with this. Follow directions with this* 5. Drink plenty of fluids 6. Keep thermostat turn down low 7.For any cough or congestion  Use plain Mucinex- regular strength or max strength is fine   * Children- consult with Pharmacist for dosing 8. For fever or aces or pains- take tylenol or ibuprofen appropriate for age and weight.  * for fevers greater than 101 orally you may alternate ibuprofen and tylenol every  3 hours.    - SARS-CoV-2 Semi-Quantitative Total Antibody, Spike; Future    Follow Up Instructions: prn    I discussed the assessment and treatment plan with the patient. The patient was provided an opportunity to ask questions and all were answered. The patient agreed with the plan and demonstrated an understanding of the instructions.   The patient was advised to call back or seek an in-person evaluation if the symptoms worsen or if the condition fails to improve as anticipated.  The above assessment and management plan was discussed with the patient. The patient verbalized understanding of and has agreed to the management plan. Patient is aware to call the clinic if symptoms persist or worsen. Patient is aware when to return to the clinic for a follow-up visit. Patient educated on when it is appropriate to go to the emergency department.  Time call ended:  9:53  I provided 13 minutes of non-face-to-face time during this encounter.    Mary-Margaret Hassell Done, FNP

## 2020-09-09 NOTE — Addendum Note (Signed)
Addended by: Rolena Infante on: 09/09/2020 11:20 AM   Modules accepted: Orders

## 2020-09-10 LAB — NOVEL CORONAVIRUS, NAA: SARS-CoV-2, NAA: NOT DETECTED

## 2020-09-10 LAB — SARS-COV-2, NAA 2 DAY TAT

## 2020-09-18 ENCOUNTER — Encounter: Payer: Self-pay | Admitting: Family Medicine

## 2020-09-18 ENCOUNTER — Ambulatory Visit (INDEPENDENT_AMBULATORY_CARE_PROVIDER_SITE_OTHER): Payer: BC Managed Care – PPO | Admitting: Family Medicine

## 2020-09-18 ENCOUNTER — Telehealth: Payer: Self-pay

## 2020-09-18 DIAGNOSIS — J069 Acute upper respiratory infection, unspecified: Secondary | ICD-10-CM

## 2020-09-18 DIAGNOSIS — R059 Cough, unspecified: Secondary | ICD-10-CM | POA: Diagnosis not present

## 2020-09-18 LAB — VERITOR FLU A/B WAIVED
Influenza A: NEGATIVE
Influenza B: NEGATIVE

## 2020-09-18 MED ORDER — BENZONATATE 100 MG PO CAPS: 200.0000 mg | ORAL_CAPSULE | Freq: Three times a day (TID) | ORAL | 1 refills | Status: DC | PRN

## 2020-09-18 MED ORDER — AZITHROMYCIN 250 MG PO TABS: ORAL_TABLET | ORAL | 0 refills | Status: DC

## 2020-09-18 MED ORDER — BENZONATATE 200 MG PO CAPS
200.0000 mg | ORAL_CAPSULE | Freq: Three times a day (TID) | ORAL | 1 refills | Status: DC | PRN
Start: 2020-09-18 — End: 2020-10-18

## 2020-09-18 MED ORDER — DEXAMETHASONE 6 MG PO TABS: 6.0000 mg | ORAL_TABLET | Freq: Every day | ORAL | 0 refills | Status: AC

## 2020-09-18 MED ORDER — ALBUTEROL SULFATE HFA 108 (90 BASE) MCG/ACT IN AERS: 2.0000 | INHALATION_SPRAY | Freq: Four times a day (QID) | RESPIRATORY_TRACT | 1 refills | Status: DC | PRN

## 2020-09-18 NOTE — Addendum Note (Signed)
Addended by: Liliane Bade on: 09/18/2020 02:56 PM   Modules accepted: Orders

## 2020-09-18 NOTE — Telephone Encounter (Signed)
New prescription sent for 200 mg capsules.

## 2020-09-18 NOTE — Progress Notes (Signed)
Virtual Visit via Telephone Note  I connected with Lauren Shannon on 09/18/20 at 2:04 PM by telephone and verified that I am speaking with the correct person using two identifiers. Lauren Shannon is currently located at home and nobody is currently with her during this visit. The provider, Loman Brooklyn, FNP is located in their office at time of visit.  I discussed the limitations, risks, security and privacy concerns of performing an evaluation and management service by telephone and the availability of in person appointments. I also discussed with the patient that there may be a patient responsible charge related to this service. The patient expressed understanding and agreed to proceed.  Subjective: PCP: [name], MD  Chief Complaint  Patient presents with  . URI   Patient complains of cough with production of thick mucus, sore throat (resolved this AM), fever (resolved) and weakness. Additional symptoms include head/chest congestion, headache (resolved), right ear pain, and wheezing (resolved). Onset of symptoms was 5 days ago, gradually improving since that time. She is drinking plenty of fluids. Evaluation to date: none. Treatment to date: Mucinex and Nyquil. She has a history of allergies. She does not smoke.  She has been vaccinated against influenza but not COVID-19.   ROS: Per HPI  Current Outpatient Medications:  .  alendronate (FOSAMAX) 70 MG tablet, Take 1 tablet (70 mg total) by mouth every 7 (seven) days. Take with a full glass of water on an empty stomach. Do not lay down for at least 2 hours, Disp: 4 tablet, Rfl: 11 .  aspirin 81 MG tablet, Take 81 mg by mouth every evening. , Disp: , Rfl:  .  atorvastatin (LIPITOR) 40 MG tablet, Take 1 tablet (40 mg total) by mouth daily., Disp: 90 tablet, Rfl: 0 .  blood glucose meter kit and supplies KIT, Dispense based on patient and insurance preference. Use up to four times daily as directed. (FOR ICD-10 : E11.9, Disp: 1 each,  Rfl: 0 .  Calcium Citrate-Vitamin D (CALCIUM + D PO), Take 1 tablet by mouth daily., Disp: , Rfl:  .  Dulaglutide (TRULICITY) 5.46 TK/3.5WS SOPN, Inject 0.75 mg into the skin once a week., Disp: 2 mL, Rfl: 2 .  levothyroxine (SYNTHROID) 50 MCG tablet, Take 1 tablet (50 mcg total) by mouth daily., Disp: 90 tablet, Rfl: 0 .  mirabegron ER (MYRBETRIQ) 50 MG TB24 tablet, Take 1 tablet (50 mg total) by mouth daily., Disp: 90 tablet, Rfl: 1 .  Multiple Vitamin (MULTIVITAMIN) capsule, Take 1 capsule by mouth daily., Disp: , Rfl:  .  pregabalin (LYRICA) 200 MG capsule, TAKE ONE TABLET AT BEDTIME, Disp: 90 capsule, Rfl: 0 .  traZODone (DESYREL) 150 MG tablet, Take 1 to 2 tablets at bedtime for sleep, Disp: 180 tablet, Rfl: 0 .  vitamin B-12 (CYANOCOBALAMIN) 1000 MCG tablet, Take 2,000 mcg by mouth daily., Disp: , Rfl:   Allergies  Allergen Reactions  . Oxybutynin Shortness Of Breath and Palpitations   Past Medical History:  Diagnosis Date  . Allergy   . Arthritis    ostoarthritis  . Basal cell carcinoma   . Essential hypertension    pt states not currently being treated for this  . Headache   . History of pneumonia 1963  . Low back pain   . Mixed hyperlipidemia   . Routine gynecological examination 08/22/2013  . Type 2 diabetes mellitus ([place])    pt stated not currently being treated for this    Observations/Objective: A&O  No  respiratory distress or wheezing audible over the phone Mood, judgement, and thought processes all WNL  Assessment and Plan: 1. Upper respiratory tract infection, unspecified type - Discussed symptom management and what should prompt her to go to the ER. - azithromycin (ZITHROMAX Z-PAK) 250 MG tablet; Take 2 tablets (500 mg) PO today, then 1 tablet (250 mg) PO daily x4 days.  Dispense: 6 tablet; Refill: 0 - benzonatate (TESSALON PERLES) 100 MG capsule; Take 2 capsules (200 mg total) by mouth 3 (three) times daily as needed for cough.  Dispense: 30 capsule; Refill:  1 - dexamethasone (DECADRON) 6 MG tablet; Take 1 tablet (6 mg total) by mouth daily for 7 days.  Dispense: 7 tablet; Refill: 0 - albuterol (VENTOLIN HFA) 108 (90 Base) MCG/ACT inhaler; Inhale 2 puffs into the lungs every 6 (six) hours as needed for wheezing or shortness of breath.  Dispense: 18 g; Refill: 1  2. Cough - azithromycin (ZITHROMAX Z-PAK) 250 MG tablet; Take 2 tablets (500 mg) PO today, then 1 tablet (250 mg) PO daily x4 days.  Dispense: 6 tablet; Refill: 0 - benzonatate (TESSALON PERLES) 100 MG capsule; Take 2 capsules (200 mg total) by mouth 3 (three) times daily as needed for cough.  Dispense: 30 capsule; Refill: 1 - dexamethasone (DECADRON) 6 MG tablet; Take 1 tablet (6 mg total) by mouth daily for 7 days.  Dispense: 7 tablet; Refill: 0 - albuterol (VENTOLIN HFA) 108 (90 Base) MCG/ACT inhaler; Inhale 2 puffs into the lungs every 6 (six) hours as needed for wheezing or shortness of breath.  Dispense: 18 g; Refill: 1 - Novel Coronavirus, NAA ([facility]); Future - Veritor Flu A/B Waived; Future   Follow Up Instructions:  I discussed the assessment and treatment plan with the patient. The patient was provided an opportunity to ask questions and all were answered. The patient agreed with the plan and demonstrated an understanding of the instructions.   The patient was advised to call back or seek an in-person evaluation if the symptoms worsen or if the condition fails to improve as anticipated.  The above assessment and management plan was discussed with the patient. The patient verbalized understanding of and has agreed to the management plan. Patient is aware to call the clinic if symptoms persist or worsen. Patient is aware when to return to the clinic for a follow-up visit. Patient educated on when it is appropriate to go to the emergency department.   Time call ended: 2:16 PM  I provided 14 minutes of non-face-to-face time during this encounter.  Hendricks Limes, MSN, APRN,  FNP-C [facility] 09/18/20

## 2020-09-20 ENCOUNTER — Encounter: Payer: Self-pay | Admitting: Family Medicine

## 2020-09-20 LAB — NOVEL CORONAVIRUS, NAA: SARS-CoV-2, NAA: DETECTED — AB

## 2020-09-20 LAB — SARS-COV-2, NAA 2 DAY TAT

## 2020-09-25 ENCOUNTER — Encounter: Payer: Self-pay | Admitting: Family Medicine

## 2020-09-25 ENCOUNTER — Ambulatory Visit: Payer: BC Managed Care – PPO | Admitting: Family Medicine

## 2020-10-18 ENCOUNTER — Other Ambulatory Visit: Payer: Self-pay | Admitting: Family Medicine

## 2020-10-18 ENCOUNTER — Ambulatory Visit: Payer: BC Managed Care – PPO | Admitting: Family Medicine

## 2020-10-18 ENCOUNTER — Other Ambulatory Visit: Payer: Self-pay

## 2020-10-18 ENCOUNTER — Encounter: Payer: Self-pay | Admitting: Family Medicine

## 2020-10-18 VITALS — BP 111/76 | HR 97 | Temp 97.0°F | Resp 20 | Ht 65.0 in | Wt 201.0 lb

## 2020-10-18 DIAGNOSIS — E782 Mixed hyperlipidemia: Secondary | ICD-10-CM | POA: Diagnosis not present

## 2020-10-18 DIAGNOSIS — I10 Essential (primary) hypertension: Secondary | ICD-10-CM

## 2020-10-18 DIAGNOSIS — E119 Type 2 diabetes mellitus without complications: Secondary | ICD-10-CM

## 2020-10-18 DIAGNOSIS — E039 Hypothyroidism, unspecified: Secondary | ICD-10-CM

## 2020-10-18 LAB — BAYER DCA HB A1C WAIVED: HB A1C (BAYER DCA - WAIVED): 5.3 % (ref <7.0)

## 2020-10-18 NOTE — Progress Notes (Signed)
Subjective:  Patient ID: Lauren Shannon, female    DOB: 01/12/1956  Age: 65 y.o. MRN: [id number]  CC: 6 week follow up   HPI Lauren Shannon presents forFollow-up of diabetes. Patient not checking blood sugar at home.  Patient denies symptoms such as polyuria, polydipsia, excessive hunger, nausea No significant hypoglycemic spells noted.  She is tolerating the Trulicity well.  She has no nausea. Medications reviewed. Pt reports taking them regularly without complication/adverse reaction being reported today.  Patient presents for follow-up on  thyroid. The patient has a history of hypothyroidism for many years. It has been stable recently. Pt. denies any change in  voice, loss of hair, heat or cold intolerance. . Patient denies constipation and diarrhea. No myxedema. Medication is as noted below. Verified that pt is taking it daily on an empty stomach. Well tolerated.  Patient had Covid diagnosed on January 19.  It made her extremely weak for about 2 weeks.  She was somewhat dehydrated just because she felt too weak to get up and get herself something to drink.  Her husband did help take care of her though and she has gotten a lot better.  Her only symptom now is mild to moderate fatigue.  History Lauren Shannon has a past medical history of Allergy, Arthritis, Basal cell carcinoma, COVID-19 (08/2020), Essential hypertension, Headache, History of pneumonia (1963), Low back pain, Mixed hyperlipidemia, Routine gynecological examination (08/22/2013), and Type 2 diabetes mellitus ([place]).   She has a past surgical history that includes Thyroid surgery (2005); Uterine polyp removal; Tubal ligation (1993); Cholecystectomy; Appendectomy; Tonsillectomy; Plantar fascia surgery (Right, 2012); stimulation system implant (2015); and Colonoscopy (N/A, 01/28/2018).   Her family history includes Diabetes in her mother; Heart disease in her brother and father; Hypertension in her mother.She reports that she has never smoked.  She has never used smokeless tobacco. She reports current alcohol use. She reports that she does not use drugs.  Current Outpatient Medications on File Prior to Visit  Medication Sig Dispense Refill  . albuterol (VENTOLIN HFA) 108 (90 Base) MCG/ACT inhaler Inhale 2 puffs into the lungs every 6 (six) hours as needed for wheezing or shortness of breath. 18 g 1  . alendronate (FOSAMAX) 70 MG tablet Take 1 tablet (70 mg total) by mouth every 7 (seven) days. Take with a full glass of water on an empty stomach. Do not lay down for at least 2 hours 4 tablet 11  . aspirin 81 MG tablet Take 81 mg by mouth every evening.     Marland Kitchen atorvastatin (LIPITOR) 40 MG tablet Take 1 tablet (40 mg total) by mouth daily. 90 tablet 0  . blood glucose meter kit and supplies KIT Dispense based on patient and insurance preference. Use up to four times daily as directed. (FOR ICD-10 : E11.9 1 each 0  . Calcium Citrate-Vitamin D (CALCIUM + D PO) Take 1 tablet by mouth daily.    . Dulaglutide (TRULICITY) 0.07 MA/2.6JF SOPN Inject 0.75 mg into the skin once a week. 2 mL 2  . levothyroxine (SYNTHROID) 50 MCG tablet Take 1 tablet (50 mcg total) by mouth daily. 90 tablet 0  . mirabegron ER (MYRBETRIQ) 50 MG TB24 tablet Take 1 tablet (50 mg total) by mouth daily. 90 tablet 1  . Multiple Vitamin (MULTIVITAMIN) capsule Take 1 capsule by mouth daily.    . pregabalin (LYRICA) 200 MG capsule TAKE ONE TABLET AT BEDTIME 90 capsule 0  . traZODone (DESYREL) 150 MG tablet Take 1 to 2 tablets  at bedtime for sleep 180 tablet 0  . vitamin B-12 (CYANOCOBALAMIN) 1000 MCG tablet Take 2,000 mcg by mouth daily.     No current facility-administered medications on file prior to visit.    ROS Review of Systems  Constitutional: Negative.   HENT: Negative.   Eyes: Negative for visual disturbance.  Respiratory: Negative for shortness of breath.   Cardiovascular: Negative for chest pain.  Gastrointestinal: Negative for abdominal pain.   Musculoskeletal: Negative for arthralgias.    Objective:  BP 111/76   Pulse 97   Temp (!) 97 F (36.1 C) (Temporal)   Resp 20   Ht _0  (1.651 m)   Wt 201 lb (91.2 kg)   SpO2 94%   BMI 33.45 kg/m   BP Readings from Last 3 Encounters:  10/18/20 111/76  07/24/20 113/66  06/05/20 112/81    Wt Readings from Last 3 Encounters:  10/18/20 201 lb (91.2 kg)  07/24/20 204 lb 2 oz (92.6 kg)  06/05/20 201 lb (91.2 kg)     Physical Exam Constitutional:      General: She is not in acute distress.    Appearance: She is well-developed.  HENT:     Head: Normocephalic and atraumatic.  Eyes:     Conjunctiva/sclera: Conjunctivae normal.     Pupils: Pupils are equal, round, and reactive to light.  Neck:     Thyroid: No thyromegaly.  Cardiovascular:     Rate and Rhythm: Normal rate and regular rhythm.     Heart sounds: Normal heart sounds. No murmur heard.   Pulmonary:     Effort: Pulmonary effort is normal. No respiratory distress.     Breath sounds: Normal breath sounds. No wheezing or rales.  Abdominal:     General: Bowel sounds are normal. There is no distension.     Palpations: Abdomen is soft.     Tenderness: There is no abdominal tenderness.  Musculoskeletal:        General: Normal range of motion.     Cervical back: Normal range of motion and neck supple.  Lymphadenopathy:     Cervical: No cervical adenopathy.  Skin:    General: Skin is warm and dry.  Neurological:     Mental Status: She is alert and oriented to person, place, and time.  Psychiatric:        Behavior: Behavior normal.        Thought Content: Thought content normal.        Judgment: Judgment normal.       Assessment & Plan:   Lauren Shannon was seen today for 6 week follow up.  Diagnoses and all orders for this visit:  Type 2 diabetes mellitus without complication, without long-term current use of insulin (HCC) -     Microalbumin / creatinine urine ratio -     Bayer DCA Hb A1c Waived -     CBC  with Differential/Platelet -     CMP14+EGFR -     Lipid panel  Essential hypertension -     CBC with Differential/Platelet -     CMP14+EGFR -     Lipid panel  Mixed hyperlipidemia -     CBC with Differential/Platelet -     CMP14+EGFR -     Lipid panel  Hypothyroidism, unspecified type -     CBC with Differential/Platelet -     CMP14+EGFR -     Lipid panel -     Thyroid Panel With TSH      I  have discontinued Lauren Shannon's azithromycin and benzonatate. I am also having her maintain her multivitamin, aspirin, Calcium Citrate-Vitamin D (CALCIUM + D PO), blood glucose meter kit and supplies, vitamin B-12, traZODone, pregabalin, mirabegron ER, levothyroxine, atorvastatin, Trulicity, alendronate, and albuterol.  No orders of the defined types were placed in this encounter.    Follow-up: Return in about 3 months (around 01/15/2021).  [name], M.D.

## 2020-10-19 LAB — LIPID PANEL
Chol/HDL Ratio: 2.5 ratio (ref 0.0–4.4)
Cholesterol, Total: 155 mg/dL (ref 100–199)
HDL: 63 mg/dL (ref >39)
LDL Chol Calc (NIH): 73 mg/dL (ref 0–99)
Triglycerides: 104 mg/dL (ref 0–149)
VLDL Cholesterol Cal: 19 mg/dL (ref 5–40)

## 2020-10-19 LAB — CMP14+EGFR
ALT: 19 IU/L (ref 0–32)
AST: 20 IU/L (ref 0–40)
Albumin/Globulin Ratio: 1.7 (ref 1.2–2.2)
Albumin: 4 g/dL (ref 3.8–4.8)
Alkaline Phosphatase: 54 IU/L (ref 44–121)
BUN/Creatinine Ratio: 14 (ref 12–28)
BUN: 10 mg/dL (ref 8–27)
Bilirubin Total: 0.8 mg/dL (ref 0.0–1.2)
CO2: 24 mmol/L (ref 20–29)
Calcium: 9.1 mg/dL (ref 8.7–10.3)
Chloride: 99 mmol/L (ref 96–106)
Creatinine, Ser: 0.71 mg/dL (ref 0.57–1.00)
GFR calc Af Amer: 105 mL/min/{1.73_m2} (ref >59)
GFR calc non Af Amer: 91 mL/min/{1.73_m2} (ref >59)
Globulin, Total: 2.3 g/dL (ref 1.5–4.5)
Glucose: 86 mg/dL (ref 65–99)
Potassium: 4.2 mmol/L (ref 3.5–5.2)
Sodium: 136 mmol/L (ref 134–144)
Total Protein: 6.3 g/dL (ref 6.0–8.5)

## 2020-10-19 LAB — CBC WITH DIFFERENTIAL/PLATELET
Basophils Absolute: 0 10*3/uL (ref 0.0–0.2)
Basos: 1 %
EOS (ABSOLUTE): 0.1 10*3/uL (ref 0.0–0.4)
Eos: 1 %
Hematocrit: 40.9 % (ref 34.0–46.6)
Hemoglobin: 13.3 g/dL (ref 11.1–15.9)
Immature Grans (Abs): 0 10*3/uL (ref 0.0–0.1)
Immature Granulocytes: 0 %
Lymphocytes Absolute: 1.7 10*3/uL (ref 0.7–3.1)
Lymphs: 43 %
MCH: 30.9 pg (ref 26.6–33.0)
MCHC: 32.5 g/dL (ref 31.5–35.7)
MCV: 95 fL (ref 79–97)
Monocytes Absolute: 0.4 10*3/uL (ref 0.1–0.9)
Monocytes: 11 %
Neutrophils Absolute: 1.7 10*3/uL (ref 1.4–7.0)
Neutrophils: 44 %
Platelets: 226 10*3/uL (ref 150–450)
RBC: 4.3 x10E6/uL (ref 3.77–5.28)
RDW: 12.6 % (ref 11.7–15.4)
WBC: 4 10*3/uL (ref 3.4–10.8)

## 2020-10-19 LAB — THYROID PANEL WITH TSH
Free Thyroxine Index: 1.9 (ref 1.2–4.9)
T3 Uptake Ratio: 30 % (ref 24–39)
T4, Total: 6.4 ug/dL (ref 4.5–12.0)
TSH: 1.28 u[IU]/mL (ref 0.450–4.500)

## 2020-10-19 LAB — MICROALBUMIN / CREATININE URINE RATIO
Creatinine, Urine: 49.9 mg/dL
Microalb/Creat Ratio: 14 mg/g creat (ref 0–29)
Microalbumin, Urine: 7.1 ug/mL

## 2020-10-20 NOTE — Progress Notes (Signed)
Hello Maryjo,  Your lab result is normal and/or stable.Some minor variations that are not significant are commonly marked abnormal, but do not represent any medical problem for you.  Best regards, [name], M.D.

## 2020-11-06 DIAGNOSIS — T85840A Pain due to nervous system prosthetic devices, implants and grafts, initial encounter: Secondary | ICD-10-CM | POA: Diagnosis not present

## 2020-11-06 DIAGNOSIS — N3941 Urge incontinence: Secondary | ICD-10-CM | POA: Diagnosis not present

## 2020-11-14 DIAGNOSIS — N3941 Urge incontinence: Secondary | ICD-10-CM | POA: Diagnosis not present

## 2020-12-07 ENCOUNTER — Other Ambulatory Visit: Payer: Self-pay | Admitting: Family Medicine

## 2020-12-07 DIAGNOSIS — E119 Type 2 diabetes mellitus without complications: Secondary | ICD-10-CM

## 2020-12-18 DIAGNOSIS — N3941 Urge incontinence: Secondary | ICD-10-CM | POA: Diagnosis not present

## 2020-12-18 DIAGNOSIS — R351 Nocturia: Secondary | ICD-10-CM | POA: Diagnosis not present

## 2020-12-24 ENCOUNTER — Other Ambulatory Visit: Payer: Self-pay | Admitting: Family Medicine

## 2021-01-16 ENCOUNTER — Other Ambulatory Visit: Payer: Self-pay | Admitting: Family Medicine

## 2021-01-16 DIAGNOSIS — E039 Hypothyroidism, unspecified: Secondary | ICD-10-CM

## 2021-01-16 DIAGNOSIS — E119 Type 2 diabetes mellitus without complications: Secondary | ICD-10-CM

## 2021-01-16 DIAGNOSIS — E782 Mixed hyperlipidemia: Secondary | ICD-10-CM

## 2021-01-20 ENCOUNTER — Ambulatory Visit: Payer: BC Managed Care – PPO | Admitting: Family Medicine

## 2021-01-20 ENCOUNTER — Encounter: Payer: Self-pay | Admitting: Family Medicine

## 2021-01-20 ENCOUNTER — Ambulatory Visit (INDEPENDENT_AMBULATORY_CARE_PROVIDER_SITE_OTHER): Payer: BC Managed Care – PPO

## 2021-01-20 ENCOUNTER — Other Ambulatory Visit: Payer: Self-pay

## 2021-01-20 VITALS — BP 95/65 | HR 83 | Temp 97.3°F | Ht 65.0 in | Wt 200.8 lb

## 2021-01-20 DIAGNOSIS — E119 Type 2 diabetes mellitus without complications: Secondary | ICD-10-CM

## 2021-01-20 DIAGNOSIS — M25562 Pain in left knee: Secondary | ICD-10-CM | POA: Diagnosis not present

## 2021-01-20 DIAGNOSIS — E039 Hypothyroidism, unspecified: Secondary | ICD-10-CM | POA: Diagnosis not present

## 2021-01-20 DIAGNOSIS — E782 Mixed hyperlipidemia: Secondary | ICD-10-CM | POA: Diagnosis not present

## 2021-01-20 DIAGNOSIS — M1712 Unilateral primary osteoarthritis, left knee: Secondary | ICD-10-CM | POA: Diagnosis not present

## 2021-01-20 LAB — BAYER DCA HB A1C WAIVED: HB A1C (BAYER DCA - WAIVED): 5.5 % (ref <7.0)

## 2021-01-20 MED ORDER — CELECOXIB 200 MG PO CAPS: 200.0000 mg | ORAL_CAPSULE | Freq: Every day | ORAL | 5 refills | Status: DC

## 2021-01-20 NOTE — Progress Notes (Signed)
Subjective:  Patient ID: Lauren Shannon, female    DOB: [birth year]  Age: 65 y.o. MRN: [id number]  CC: Medical Management of Chronic Issues   HPI Lauren Shannon presents forFollow-up of diabetes. Patient doesn't check blood sugar at home.   Patient denies symptoms such as polyuria, polydipsia, excessive hunger, nausea No significant hypoglycemic spells noted. Medications reviewed. Pt reports taking them regularly without complication/adverse reaction being reported today.    Left knee swelling for 3 weeks. Painful. Pops. No relief by positioning.    follow-up on  thyroid. The patient has a history of hypothyroidism for many years. It has been stable recently. Pt. denies any change in  voice, loss of hair, heat or cold intolerance. Energy level has been adequate to good. Patient denies constipation and diarrhea. No myxedema. Medication is as noted below. Verified that pt is taking it daily on an empty stomach. Well tolerated.   History Lauren Shannon has a past medical history of Allergy, Arthritis, Basal cell carcinoma, COVID-19 (08/2020), Essential hypertension, Headache, History of pneumonia (1963), Low back pain, Mixed hyperlipidemia, Routine gynecological examination (08/22/2013), and Type 2 diabetes mellitus ([place]).   Lauren Shannon has a past surgical history that includes Thyroid surgery (2005); Uterine polyp removal; Tubal ligation (1993); Cholecystectomy; Appendectomy; Tonsillectomy; Plantar fascia surgery (Right, 2012); stimulation system implant (2015); and Colonoscopy (N/A, 01/28/2018).   Lauren Shannon family history includes Diabetes in Lauren Shannon mother; Heart disease in Lauren Shannon brother and father; Hypertension in Lauren Shannon mother.Lauren Shannon reports that Lauren Shannon has never smoked. Lauren Shannon has never used smokeless tobacco. Lauren Shannon reports current alcohol use. Lauren Shannon reports that Lauren Shannon does not use drugs.  Current Outpatient Medications on File Prior to Visit  Medication Sig Dispense Refill  . albuterol (VENTOLIN HFA) 108 (90 Base) MCG/ACT inhaler  Inhale 2 puffs into the lungs every 6 (six) hours as needed for wheezing or shortness of breath. 18 g 1  . alendronate (FOSAMAX) 70 MG tablet Take 1 tablet (70 mg total) by mouth every 7 (seven) days. Take with a full glass of water on an empty stomach. Do not lay down for at least 2 hours 4 tablet 11  . aspirin 81 MG tablet Take 81 mg by mouth every evening.     Marland Kitchen atorvastatin (LIPITOR) 40 MG tablet TAKE 1 TABLET DAILY 90 tablet 0  . blood glucose meter kit and supplies KIT Dispense based on patient and insurance preference. Use up to four times daily as directed. (FOR ICD-10 : E11.9 1 each 0  . Calcium Citrate-Vitamin D (CALCIUM + D PO) Take 1 tablet by mouth daily.    Marland Kitchen levothyroxine (SYNTHROID) 50 MCG tablet TAKE 1 TABLET DAILY 90 tablet 2  . mirabegron ER (MYRBETRIQ) 50 MG TB24 tablet Take 1 tablet (50 mg total) by mouth daily. 90 tablet 1  . Multiple Vitamin (MULTIVITAMIN) capsule Take 1 capsule by mouth daily.    . pregabalin (LYRICA) 200 MG capsule TAKE ONE TABLET AT BEDTIME 90 capsule 0  . traZODone (DESYREL) 150 MG tablet Take 1 to 2 tablets at bedtime for sleep 425 tablet 0  . TRULICITY 9.56 LO/7.5IE SOPN INJECT 0.75MG ONCE A WEEK 2 mL 1  . vitamin B-12 (CYANOCOBALAMIN) 1000 MCG tablet Take 2,000 mcg by mouth daily.     No current facility-administered medications on file prior to visit.    ROS Review of Systems  Constitutional: Negative.   HENT: Negative.   Eyes: Negative for visual disturbance.  Respiratory: Negative for shortness of breath.   Cardiovascular: Negative for chest pain.  Gastrointestinal: Negative for abdominal pain.  Musculoskeletal: Positive for arthralgias.    Objective:  BP 95/65   Pulse 83   Temp (!) 97.3 F (36.3 C)   Ht _0  (1.651 m)   Wt 200 lb 12.8 oz (91.1 kg)   SpO2 99%   BMI 33.41 kg/m   BP Readings from Last 3 Encounters:  01/20/21 95/65  10/18/20 111/76  07/24/20 113/66    Wt Readings from Last 3 Encounters:  01/20/21 200 lb  12.8 oz (91.1 kg)  10/18/20 201 lb (91.2 kg)  07/24/20 204 lb 2 oz (92.6 kg)     Physical Exam Constitutional:      General: Lauren Shannon is not in acute distress.    Appearance: Lauren Shannon is well-developed.  Cardiovascular:     Rate and Rhythm: Normal rate and regular rhythm.  Pulmonary:     Breath sounds: Normal breath sounds.  Musculoskeletal:        General: Tenderness present. Normal range of motion.  Skin:    General: Skin is warm and dry.  Neurological:     Mental Status: Lauren Shannon is alert and oriented to person, place, and time.       Assessment & Plan:   Lauren Shannon was seen today for medical management of chronic issues.  Diagnoses and all orders for this visit:  Type 2 diabetes mellitus without complication, without long-term current use of insulin (HCC) -     Bayer DCA Hb A1c Waived -     CBC with Differential/Platelet -     CMP14+EGFR  Mixed hyperlipidemia -     Lipid panel  Hypothyroidism, unspecified type -     Thyroid Panel With TSH  Acute pain of left knee -     DG Knee 1-2 Views Left; Future  Other orders -     celecoxib (CELEBREX) 200 MG capsule; Take 1 capsule (200 mg total) by mouth daily. With food      I am having Lauren Shannon start on celecoxib. I am also having Lauren Shannon maintain Lauren Shannon multivitamin, aspirin, Calcium Citrate-Vitamin D (CALCIUM + D PO), blood glucose meter kit and supplies, vitamin B-12, traZODone, mirabegron ER, alendronate, albuterol, Trulicity, pregabalin, levothyroxine, and atorvastatin.  Meds ordered this encounter  Medications  . celecoxib (CELEBREX) 200 MG capsule    Sig: Take 1 capsule (200 mg total) by mouth daily. With food    Dispense:  30 capsule    Refill:  5     Follow-up: Return in about 6 months (around 07/23/2021).  [name], M.D.

## 2021-01-21 LAB — CBC WITH DIFFERENTIAL/PLATELET
Basophils Absolute: 0 10*3/uL (ref 0.0–0.2)
Basos: 1 %
EOS (ABSOLUTE): 0 10*3/uL (ref 0.0–0.4)
Eos: 1 %
Hematocrit: 43.8 % (ref 34.0–46.6)
Hemoglobin: 14.6 g/dL (ref 11.1–15.9)
Immature Grans (Abs): 0 10*3/uL (ref 0.0–0.1)
Immature Granulocytes: 0 %
Lymphocytes Absolute: 1.5 10*3/uL (ref 0.7–3.1)
Lymphs: 33 %
MCH: 31.6 pg (ref 26.6–33.0)
MCHC: 33.3 g/dL (ref 31.5–35.7)
MCV: 95 fL (ref 79–97)
Monocytes Absolute: 0.4 10*3/uL (ref 0.1–0.9)
Monocytes: 9 %
Neutrophils Absolute: 2.5 10*3/uL (ref 1.4–7.0)
Neutrophils: 56 %
Platelets: 195 10*3/uL (ref 150–450)
RBC: 4.62 x10E6/uL (ref 3.77–5.28)
RDW: 11.5 % — ABNORMAL LOW (ref 11.7–15.4)
WBC: 4.5 10*3/uL (ref 3.4–10.8)

## 2021-01-21 LAB — CMP14+EGFR
ALT: 25 IU/L (ref 0–32)
AST: 23 IU/L (ref 0–40)
Albumin/Globulin Ratio: 1.9 (ref 1.2–2.2)
Albumin: 4.3 g/dL (ref 3.8–4.8)
Alkaline Phosphatase: 47 IU/L (ref 44–121)
BUN/Creatinine Ratio: 20 (ref 12–28)
BUN: 14 mg/dL (ref 8–27)
Bilirubin Total: 0.6 mg/dL (ref 0.0–1.2)
CO2: 24 mmol/L (ref 20–29)
Calcium: 9.1 mg/dL (ref 8.7–10.3)
Chloride: 102 mmol/L (ref 96–106)
Creatinine, Ser: 0.71 mg/dL (ref 0.57–1.00)
Globulin, Total: 2.3 g/dL (ref 1.5–4.5)
Glucose: 98 mg/dL (ref 65–99)
Potassium: 4.7 mmol/L (ref 3.5–5.2)
Sodium: 139 mmol/L (ref 134–144)
Total Protein: 6.6 g/dL (ref 6.0–8.5)
eGFR: 95 mL/min/{1.73_m2} (ref >59)

## 2021-01-21 LAB — LIPID PANEL
Chol/HDL Ratio: 2.1 ratio (ref 0.0–4.4)
Cholesterol, Total: 158 mg/dL (ref 100–199)
HDL: 74 mg/dL (ref >39)
LDL Chol Calc (NIH): 69 mg/dL (ref 0–99)
Triglycerides: 79 mg/dL (ref 0–149)
VLDL Cholesterol Cal: 15 mg/dL (ref 5–40)

## 2021-01-21 LAB — THYROID PANEL WITH TSH
Free Thyroxine Index: 2.1 (ref 1.2–4.9)
T3 Uptake Ratio: 30 % (ref 24–39)
T4, Total: 6.9 ug/dL (ref 4.5–12.0)
TSH: 1.33 u[IU]/mL (ref 0.450–4.500)

## 2021-01-22 NOTE — Progress Notes (Signed)
Hello Lauren Shannon,  Your lab result is normal and/or stable.Some minor variations that are not significant are commonly marked abnormal, but do not represent any medical problem for you.  Best regards, [name], M.D.

## 2021-02-03 ENCOUNTER — Other Ambulatory Visit: Payer: Self-pay | Admitting: Family Medicine

## 2021-02-03 DIAGNOSIS — E119 Type 2 diabetes mellitus without complications: Secondary | ICD-10-CM

## 2021-02-07 LAB — HM DIABETES EYE EXAM

## 2021-02-10 ENCOUNTER — Other Ambulatory Visit: Payer: BC Managed Care – PPO | Admitting: Obstetrics & Gynecology

## 2021-02-13 ENCOUNTER — Encounter: Payer: Self-pay | Admitting: Internal Medicine

## 2021-02-19 ENCOUNTER — Encounter: Payer: Self-pay | Admitting: Family Medicine

## 2021-02-19 ENCOUNTER — Ambulatory Visit (INDEPENDENT_AMBULATORY_CARE_PROVIDER_SITE_OTHER): Payer: BC Managed Care – PPO | Admitting: Family Medicine

## 2021-02-19 ENCOUNTER — Other Ambulatory Visit: Payer: Self-pay

## 2021-02-19 VITALS — BP 108/69 | HR 82 | Temp 97.8°F | Ht 65.0 in | Wt 203.2 lb

## 2021-02-19 DIAGNOSIS — G8929 Other chronic pain: Secondary | ICD-10-CM | POA: Diagnosis not present

## 2021-02-19 DIAGNOSIS — R300 Dysuria: Secondary | ICD-10-CM

## 2021-02-19 DIAGNOSIS — M546 Pain in thoracic spine: Secondary | ICD-10-CM

## 2021-02-19 LAB — MICROSCOPIC EXAMINATION: RBC, Urine: NONE SEEN /hpf (ref 0–2)

## 2021-02-19 LAB — URINALYSIS, COMPLETE
Bilirubin, UA: NEGATIVE
Glucose, UA: NEGATIVE
Ketones, UA: NEGATIVE
Nitrite, UA: POSITIVE — AB
Protein,UA: NEGATIVE
Specific Gravity, UA: 1.01 (ref 1.005–1.030)
Urobilinogen, Ur: 0.2 mg/dL (ref 0.2–1.0)
pH, UA: 5.5 (ref 5.0–7.5)

## 2021-02-19 MED ORDER — SULFAMETHOXAZOLE-TRIMETHOPRIM 800-160 MG PO TABS: 1.0000 | ORAL_TABLET | Freq: Two times a day (BID) | ORAL | 0 refills | Status: DC

## 2021-02-19 MED ORDER — CELECOXIB 400 MG PO CAPS: 400.0000 mg | ORAL_CAPSULE | Freq: Every day | ORAL | 1 refills | Status: DC

## 2021-02-19 NOTE — Progress Notes (Signed)
Subjective:  Patient ID: Lauren Shannon, female    DOB: July 16, 1956  Age: 65 y.o. MRN: [id number]  CC: Dysuria   HPI Kristyanna Barcelo presents for burning with urination and frequency for 5 days. Urine is cloudy.feeling a lot of pressure.  She has used Azo with partial relief of the pressure.  Denies fever . No flank pain. No nausea, vomiting.   Depression screen [facility] 2/9 02/19/2021 02/19/2021 01/20/2021  Decreased Interest 0 0 0  Down, Depressed, Hopeless 0 0 0  PHQ - 2 Score 0 0 0  Altered sleeping 1 - 1  Tired, decreased energy 1 - 1  Change in appetite 0 - 1  Feeling bad or failure about yourself  0 - 0  Trouble concentrating 0 - 0  Moving slowly or fidgety/restless 0 - 1  Suicidal thoughts 0 - 0  PHQ-9 Score 2 - 4  Difficult doing work/chores Not difficult at all - Somewhat difficult  Some recent data might be hidden    History Morganna has a past medical history of Allergy, Arthritis, Basal cell carcinoma, COVID-19 (08/2020), Essential hypertension, Headache, History of pneumonia (1963), Low back pain, Mixed hyperlipidemia, Routine gynecological examination (08/22/2013), and Type 2 diabetes mellitus ([place]).   She has a past surgical history that includes Thyroid surgery (2005); Uterine polyp removal; Tubal ligation (1993); Cholecystectomy; Appendectomy; Tonsillectomy; Plantar fascia surgery (Right, 2012); stimulation system implant (2015); and Colonoscopy (N/A, 01/28/2018).   Her family history includes Diabetes in her mother; Heart disease in her brother and father; Hypertension in her mother.She reports that she has never smoked. She has never used smokeless tobacco. She reports current alcohol use. She reports that she does not use drugs.    ROS Review of Systems  Constitutional:  Negative for chills, diaphoresis and fever.  HENT:  Negative for congestion.   Eyes:  Negative for visual disturbance.  Respiratory:  Negative for cough and shortness of breath.   Cardiovascular:   Negative for chest pain and palpitations.  Gastrointestinal:  Negative for constipation, diarrhea and nausea.  Genitourinary:  Positive for dysuria, frequency and urgency. Negative for decreased urine volume, flank pain, hematuria, menstrual problem and pelvic pain.  Musculoskeletal:  Positive for arthralgias and back pain (The Celebrex seemed to work well at first.  However after a few weeks it seems to just get her through the night the pain flares the next morning until she takes celebrex the following evening.). Negative for joint swelling.  Skin:  Negative for rash.  Neurological:  Negative for dizziness and numbness.   Objective:  BP 108/69   Pulse 82   Temp 97.8 F (36.6 C)   Ht 5' 5" (1.651 m)   Wt 203 lb 3.2 oz (92.2 kg)   SpO2 96%   BMI 33.81 kg/m   BP Readings from Last 3 Encounters:  02/19/21 108/69  01/20/21 95/65  10/18/20 111/76    Wt Readings from Last 3 Encounters:  02/19/21 203 lb 3.2 oz (92.2 kg)  01/20/21 200 lb 12.8 oz (91.1 kg)  10/18/20 201 lb (91.2 kg)     Physical Exam Constitutional:      Appearance: She is well-developed.  HENT:     Head: Normocephalic and atraumatic.  Cardiovascular:     Rate and Rhythm: Normal rate and regular rhythm.     Heart sounds: No murmur heard. Pulmonary:     Effort: Pulmonary effort is normal.     Breath sounds: Normal breath sounds.  Abdominal:  General: Bowel sounds are normal.     Palpations: Abdomen is soft. There is no mass.     Tenderness: There is no abdominal tenderness. There is no guarding or rebound.  Musculoskeletal:        General: No tenderness.  Skin:    General: Skin is warm and dry.  Neurological:     Mental Status: She is alert and oriented to person, place, and time.  Psychiatric:        Behavior: Behavior normal.      Assessment & Plan:   Madisyn was seen today for dysuria.  Diagnoses and all orders for this visit:  Dysuria -     Urinalysis, Complete  Chronic midline thoracic  back pain  Other orders -     sulfamethoxazole-trimethoprim (BACTRIM DS) 800-160 MG tablet; Take 1 tablet by mouth 2 (two) times daily. -     celecoxib (CELEBREX) 400 MG capsule; Take 1 capsule (400 mg total) by mouth daily. With food      I have changed Airiel Sladek's celecoxib. I am also having her start on sulfamethoxazole-trimethoprim. Additionally, I am having her maintain her multivitamin, aspirin, Calcium Citrate-Vitamin D (CALCIUM + D PO), blood glucose meter kit and supplies, vitamin B-12, traZODone, mirabegron ER, alendronate, albuterol, pregabalin, levothyroxine, atorvastatin, and Trulicity.  Allergies as of 02/19/2021       Reactions   Oxybutynin Shortness Of Breath, Palpitations        Medication List        Accurate as of February 19, 2021  4:26 PM. If you have any questions, ask your nurse or doctor.          albuterol 108 (90 Base) MCG/ACT inhaler Commonly known as: VENTOLIN HFA Inhale 2 puffs into the lungs every 6 (six) hours as needed for wheezing or shortness of breath.   alendronate 70 MG tablet Commonly known as: FOSAMAX Take 1 tablet (70 mg total) by mouth every 7 (seven) days. Take with a full glass of water on an empty stomach. Do not lay down for at least 2 hours   aspirin 81 MG tablet Take 81 mg by mouth every evening.   atorvastatin 40 MG tablet Commonly known as: LIPITOR TAKE 1 TABLET DAILY   blood glucose meter kit and supplies Kit Dispense based on patient and insurance preference. Use up to four times daily as directed. (FOR ICD-10 : E11.9   CALCIUM + D PO Take 1 tablet by mouth daily.   celecoxib 400 MG capsule Commonly known as: CeleBREX Take 1 capsule (400 mg total) by mouth daily. With food What changed:  medication strength how much to take Changed by: [name], MD   levothyroxine 50 MCG tablet Commonly known as: SYNTHROID TAKE 1 TABLET DAILY   mirabegron ER 50 MG Tb24 tablet Commonly known as: Myrbetriq Take 1  tablet (50 mg total) by mouth daily.   multivitamin capsule Take 1 capsule by mouth daily.   pregabalin 200 MG capsule Commonly known as: LYRICA TAKE ONE TABLET AT BEDTIME   sulfamethoxazole-trimethoprim 800-160 MG tablet Commonly known as: BACTRIM DS Take 1 tablet by mouth 2 (two) times daily. Started by: [name], MD   traZODone 150 MG tablet Commonly known as: DESYREL Take 1 to 2 tablets at bedtime for sleep   Trulicity 4.12 RK/4.7TV Sopn Generic drug: Dulaglutide INJECT 0.75MG ONCE A WEEK   vitamin B-12 1000 MCG tablet Commonly known as: CYANOCOBALAMIN Take 2,000 mcg by mouth daily.  Follow-up: No follow-ups on file.  [name], M.D.

## 2021-02-19 NOTE — Addendum Note (Signed)
Addended by: Christia Reading on: 02/19/2021 04:39 PM   Modules accepted: Orders

## 2021-02-23 LAB — URINE CULTURE

## 2021-02-28 ENCOUNTER — Other Ambulatory Visit (HOSPITAL_COMMUNITY)
Admission: RE | Admit: 2021-02-28 | Discharge: 2021-02-28 | Disposition: A | Discharge status: Home / Self Care | Payer: BC Managed Care – PPO | Source: Ambulatory Visit | Attending: Obstetrics & Gynecology | Admitting: Obstetrics & Gynecology

## 2021-02-28 ENCOUNTER — Ambulatory Visit (INDEPENDENT_AMBULATORY_CARE_PROVIDER_SITE_OTHER): Payer: BC Managed Care – PPO | Admitting: Obstetrics & Gynecology

## 2021-02-28 ENCOUNTER — Other Ambulatory Visit: Payer: Self-pay

## 2021-02-28 ENCOUNTER — Encounter: Payer: Self-pay | Admitting: Obstetrics & Gynecology

## 2021-02-28 VITALS — BP 104/70 | HR 76 | Ht 65.2 in | Wt 203.0 lb

## 2021-02-28 DIAGNOSIS — Z1212 Encounter for screening for malignant neoplasm of rectum: Secondary | ICD-10-CM

## 2021-02-28 DIAGNOSIS — Z01419 Encounter for gynecological examination (general) (routine) without abnormal findings: Secondary | ICD-10-CM

## 2021-02-28 DIAGNOSIS — Z1211 Encounter for screening for malignant neoplasm of colon: Secondary | ICD-10-CM | POA: Diagnosis not present

## 2021-02-28 LAB — HEMOCCULT GUIAC POC 1CARD (OFFICE): Fecal Occult Blood, POC: NEGATIVE

## 2021-02-28 NOTE — Addendum Note (Signed)
Addended by: Linton Rump on: 02/28/2021 12:52 PM   Modules accepted: Orders

## 2021-02-28 NOTE — Progress Notes (Signed)
Subjective:     Lauren Shannon is a 65 y.o. female here for a routine exam.  No LMP recorded. Patient is postmenopausal. [id number] Birth Control Method:  menopause Menstrual Calendar(currently): amenorrhea  Current complaints: left knee arthritis.   Current acute medical issues:  arthritis   Recent Gynecologic History No LMP recorded. Patient is postmenopausal. Last Pap: 2020,  normal Last mammogram: 2021,  normal  Past Medical History:  Diagnosis Date   Allergy    Arthritis    ostoarthritis   Basal cell carcinoma    COVID-19 08/2020   Essential hypertension    pt states not currently being treated for this   Headache    History of pneumonia 1963   Low back pain    Mixed hyperlipidemia    Routine gynecological examination 08/22/2013   Type 2 diabetes mellitus ([place])    pt stated not currently being treated for this    Past Surgical History:  Procedure Laterality Date   APPENDECTOMY     CHOLECYSTECTOMY     COLONOSCOPY N/A 01/28/2018   Procedure: COLONOSCOPY;  Surgeon: Danie Binder, MD;  Location: [facility];  Service: Endoscopy;  Laterality: N/A;  1:00   PLANTAR FASCIA SURGERY Right 2012   stimulation system implant  2015   THYROID SURGERY  2005   TONSILLECTOMY     TUBAL LIGATION  1993   Uterine polyp removal      OB History     Gravida  4   Para      Term      Preterm      AB  2   Living  2      SAB      IAB  2   Ectopic      Multiple      Live Births              Social History   Socioeconomic History   Marital status: Married    Spouse name: Not on file   Number of children: 2   Years of education: Not on file   Highest education level: Not on file  Occupational History   Not on file  Tobacco Use   Smoking status: Never   Smokeless tobacco: Never  Vaping Use   Vaping Use: Never used  Substance and Sexual Activity   Alcohol use: Yes    Alcohol/week: 0.0 standard drinks    Comment: 6 per year   Drug use: No   Sexual  activity: Not Currently    Partners: Male  Other Topics Concern   Not on file  Social History Narrative   Not on file   Social Determinants of Health   Financial Resource Strain: Low Risk    Difficulty of Paying Living Expenses: Not very hard  Food Insecurity: No Food Insecurity   Worried About Running Out of Food in the Last Year: Never true   Ran Out of Food in the Last Year: Never true  Transportation Needs: No Transportation Needs   Lack of Transportation (Medical): No   Lack of Transportation (Non-Medical): No  Physical Activity: Inactive   Days of Exercise per Week: 0 days   Minutes of Exercise per Session: 0 min  Stress: Stress Concern Present   Feeling of Stress : To some extent  Social Connections: Moderately Integrated   Frequency of Communication with Friends and Family: Once a week   Frequency of Social Gatherings with Friends and Family: Once a week   Attends  Religious Services: More than 4 times per year   Active Member of Clubs or Organizations: Yes   Attends Archivist Meetings: More than 4 times per year   Marital Status: Married    Family History  Problem Relation Age of Onset   Diabetes Mother    Hypertension Mother    Heart disease Father    Heart disease Brother    Colon cancer Neg Hx    Colon polyps Neg Hx      Current Outpatient Medications:    alendronate (FOSAMAX) 70 MG tablet, Take 1 tablet (70 mg total) by mouth every 7 (seven) days. Take with a full glass of water on an empty stomach. Do not lay down for at least 2 hours, Disp: 4 tablet, Rfl: 11   aspirin 81 MG tablet, Take 81 mg by mouth every evening. , Disp: , Rfl:    atorvastatin (LIPITOR) 40 MG tablet, TAKE 1 TABLET DAILY, Disp: 90 tablet, Rfl: 0   blood glucose meter kit and supplies KIT, Dispense based on patient and insurance preference. Use up to four times daily as directed. (FOR ICD-10 : E11.9, Disp: 1 each, Rfl: 0   Calcium Citrate-Vitamin D (CALCIUM + D PO), Take 1  tablet by mouth daily., Disp: , Rfl:    celecoxib (CELEBREX) 400 MG capsule, Take 1 capsule (400 mg total) by mouth daily. With food, Disp: 90 capsule, Rfl: 1   levothyroxine (SYNTHROID) 50 MCG tablet, TAKE 1 TABLET DAILY, Disp: 90 tablet, Rfl: 2   mirabegron ER (MYRBETRIQ) 50 MG TB24 tablet, Take 1 tablet (50 mg total) by mouth daily., Disp: 90 tablet, Rfl: 1   Multiple Vitamin (MULTIVITAMIN) capsule, Take 1 capsule by mouth daily., Disp: , Rfl:    pregabalin (LYRICA) 200 MG capsule, TAKE ONE TABLET AT BEDTIME, Disp: 90 capsule, Rfl: 0   traZODone (DESYREL) 150 MG tablet, Take 1 to 2 tablets at bedtime for sleep, Disp: 180 tablet, Rfl: 0   TRULICITY 4.46 KM/6.3OT SOPN, INJECT 0.75MG ONCE A WEEK, Disp: 2 mL, Rfl: 2   vitamin B-12 (CYANOCOBALAMIN) 1000 MCG tablet, Take 2,000 mcg by mouth daily., Disp: , Rfl:    albuterol (VENTOLIN HFA) 108 (90 Base) MCG/ACT inhaler, Inhale 2 puffs into the lungs every 6 (six) hours as needed for wheezing or shortness of breath. (Patient not taking: Reported on 02/28/2021), Disp: 18 g, Rfl: 1   sulfamethoxazole-trimethoprim (BACTRIM DS) 800-160 MG tablet, Take 1 tablet by mouth 2 (two) times daily. (Patient not taking: Reported on 02/28/2021), Disp: 14 tablet, Rfl: 0  Review of Systems  Review of Systems  Constitutional: Negative for fever, chills, weight loss, malaise/fatigue and diaphoresis.  HENT: Negative for hearing loss, ear pain, nosebleeds, congestion, sore throat, neck pain, tinnitus and ear discharge.   Eyes: Negative for blurred vision, double vision, photophobia, pain, discharge and redness.  Respiratory: Negative for cough, hemoptysis, sputum production, shortness of breath, wheezing and stridor.   Cardiovascular: Negative for chest pain, palpitations, orthopnea, claudication, leg swelling and PND.  Gastrointestinal: negative for abdominal pain. Negative for heartburn, nausea, vomiting, diarrhea, constipation, blood in stool and melena.  Genitourinary:  Negative for dysuria, urgency, frequency, hematuria and flank pain.  Musculoskeletal: Negative for myalgias, back pain, joint pain and falls.  Skin: Negative for itching and rash.  Neurological: Negative for dizziness, tingling, tremors, sensory change, speech change, focal weakness, seizures, loss of consciousness, weakness and headaches.  Endo/Heme/Allergies: Negative for environmental allergies and polydipsia. Does not bruise/bleed easily.  Psychiatric/Behavioral: Negative  for depression, suicidal ideas, hallucinations, memory loss and substance abuse. The patient is not nervous/anxious and does not have insomnia.        Objective:  Blood pressure 104/70, pulse 76, height 5' 5.2" (1.656 m), weight 203 lb (92.1 kg).   Physical Exam  Vitals reviewed. Constitutional: She is oriented to person, place, and time. She appears well-developed and well-nourished.  HENT:  Head: Normocephalic and atraumatic.        Right Ear: External ear normal.  Left Ear: External ear normal.  Nose: Nose normal.  Mouth/Throat: Oropharynx is clear and moist.  Eyes: Conjunctivae and EOM are normal. Pupils are equal, round, and reactive to light. Right eye exhibits no discharge. Left eye exhibits no discharge. No scleral icterus.  Neck: Normal range of motion. Neck supple. No tracheal deviation present. No thyromegaly present.  Cardiovascular: Normal rate, regular rhythm, normal heart sounds and intact distal pulses.  Exam reveals no gallop and no friction rub.   No murmur heard. Respiratory: Effort normal and breath sounds normal. No respiratory distress. She has no wheezes. She has no rales. She exhibits no tenderness.  GI: Soft. Bowel sounds are normal. She exhibits no distension and no mass. There is no tenderness. There is no rebound and no guarding.  Genitourinary:  Breasts no masses skin changes or nipple changes bilaterally      Vulva is normal without lesions Vagina is pink moist without discharge Cervix  normal in appearance and pap is done Uterus is normal size shape and contour Adnexa is negative with normal sized ovaries  {Rectal    hemoccult negative, normal tone, no masses  Musculoskeletal: Normal range of motion. She exhibits no edema and no tenderness.  Neurological: She is alert and oriented to person, place, and time. She has normal reflexes. She displays normal reflexes. No cranial nerve deficit. She exhibits normal muscle tone. Coordination normal.  Skin: Skin is warm and dry. No rash noted. No erythema. No pallor.  Psychiatric: She has a normal mood and affect. Her behavior is normal. Judgment and thought content normal.       Medications Ordered at today's visit: No orders of the defined types were placed in this encounter.   Other orders placed at today's visit: No orders of the defined types were placed in this encounter.     Assessment:    Normal Gyn exam.    Plan:    Contraception: post menopausal status. Follow up in: 3 years.     Return in about 3 years (around 02/29/2024) for yearly.

## 2021-03-06 LAB — CYTOLOGY - PAP
Comment: NEGATIVE
Diagnosis: NEGATIVE
High risk HPV: NEGATIVE

## 2021-03-21 ENCOUNTER — Other Ambulatory Visit: Payer: Self-pay | Admitting: Family Medicine

## 2021-04-07 DIAGNOSIS — M1712 Unilateral primary osteoarthritis, left knee: Secondary | ICD-10-CM | POA: Diagnosis not present

## 2021-04-07 DIAGNOSIS — S83242A Other tear of medial meniscus, current injury, left knee, initial encounter: Secondary | ICD-10-CM | POA: Diagnosis not present

## 2021-04-07 DIAGNOSIS — M25562 Pain in left knee: Secondary | ICD-10-CM | POA: Diagnosis not present

## 2021-04-16 DIAGNOSIS — M25562 Pain in left knee: Secondary | ICD-10-CM | POA: Diagnosis not present

## 2021-04-18 ENCOUNTER — Other Ambulatory Visit: Payer: Self-pay | Admitting: Family Medicine

## 2021-04-18 DIAGNOSIS — E782 Mixed hyperlipidemia: Secondary | ICD-10-CM

## 2021-04-18 DIAGNOSIS — E119 Type 2 diabetes mellitus without complications: Secondary | ICD-10-CM

## 2021-04-23 DIAGNOSIS — M25562 Pain in left knee: Secondary | ICD-10-CM | POA: Diagnosis not present

## 2021-04-25 DIAGNOSIS — Z1211 Encounter for screening for malignant neoplasm of colon: Secondary | ICD-10-CM | POA: Diagnosis not present

## 2021-04-25 DIAGNOSIS — Z86010 Personal history of colonic polyps: Secondary | ICD-10-CM | POA: Diagnosis not present

## 2021-04-25 DIAGNOSIS — K648 Other hemorrhoids: Secondary | ICD-10-CM | POA: Diagnosis not present

## 2021-04-25 DIAGNOSIS — K635 Polyp of colon: Secondary | ICD-10-CM | POA: Diagnosis not present

## 2021-04-25 DIAGNOSIS — D122 Benign neoplasm of ascending colon: Secondary | ICD-10-CM | POA: Diagnosis not present

## 2021-04-29 ENCOUNTER — Encounter: Payer: Self-pay | Admitting: Family Medicine

## 2021-04-30 DIAGNOSIS — M25562 Pain in left knee: Secondary | ICD-10-CM | POA: Diagnosis not present

## 2021-05-02 DIAGNOSIS — M25562 Pain in left knee: Secondary | ICD-10-CM | POA: Diagnosis not present

## 2021-05-06 ENCOUNTER — Other Ambulatory Visit: Payer: Self-pay | Admitting: Family Medicine

## 2021-05-06 DIAGNOSIS — E119 Type 2 diabetes mellitus without complications: Secondary | ICD-10-CM

## 2021-05-06 DIAGNOSIS — M25562 Pain in left knee: Secondary | ICD-10-CM | POA: Diagnosis not present

## 2021-05-07 DIAGNOSIS — M25562 Pain in left knee: Secondary | ICD-10-CM | POA: Diagnosis not present

## 2021-05-07 DIAGNOSIS — M23204 Derangement of unspecified medial meniscus due to old tear or injury, left knee: Secondary | ICD-10-CM | POA: Diagnosis not present

## 2021-05-09 DIAGNOSIS — M25562 Pain in left knee: Secondary | ICD-10-CM | POA: Diagnosis not present

## 2021-05-13 DIAGNOSIS — M25562 Pain in left knee: Secondary | ICD-10-CM | POA: Diagnosis not present

## 2021-05-14 ENCOUNTER — Other Ambulatory Visit (HOSPITAL_COMMUNITY): Payer: Self-pay | Admitting: Obstetrics & Gynecology

## 2021-05-14 DIAGNOSIS — Z1231 Encounter for screening mammogram for malignant neoplasm of breast: Secondary | ICD-10-CM

## 2021-05-16 DIAGNOSIS — M25562 Pain in left knee: Secondary | ICD-10-CM | POA: Diagnosis not present

## 2021-05-23 DIAGNOSIS — M25562 Pain in left knee: Secondary | ICD-10-CM | POA: Diagnosis not present

## 2021-06-06 ENCOUNTER — Encounter: Payer: Self-pay | Admitting: Family Medicine

## 2021-06-06 DIAGNOSIS — M1712 Unilateral primary osteoarthritis, left knee: Secondary | ICD-10-CM | POA: Diagnosis not present

## 2021-06-06 DIAGNOSIS — E782 Mixed hyperlipidemia: Secondary | ICD-10-CM

## 2021-06-06 DIAGNOSIS — I10 Essential (primary) hypertension: Secondary | ICD-10-CM

## 2021-06-06 DIAGNOSIS — E039 Hypothyroidism, unspecified: Secondary | ICD-10-CM

## 2021-06-06 DIAGNOSIS — E119 Type 2 diabetes mellitus without complications: Secondary | ICD-10-CM

## 2021-06-10 ENCOUNTER — Other Ambulatory Visit: Payer: BC Managed Care – PPO

## 2021-06-10 ENCOUNTER — Other Ambulatory Visit: Payer: Self-pay

## 2021-06-10 DIAGNOSIS — I10 Essential (primary) hypertension: Secondary | ICD-10-CM

## 2021-06-10 DIAGNOSIS — E119 Type 2 diabetes mellitus without complications: Secondary | ICD-10-CM

## 2021-06-10 DIAGNOSIS — E782 Mixed hyperlipidemia: Secondary | ICD-10-CM

## 2021-06-10 DIAGNOSIS — E039 Hypothyroidism, unspecified: Secondary | ICD-10-CM | POA: Diagnosis not present

## 2021-06-10 LAB — BAYER DCA HB A1C WAIVED: HB A1C (BAYER DCA - WAIVED): 4.9 % (ref 4.8–5.6)

## 2021-06-11 LAB — CBC WITH DIFFERENTIAL/PLATELET
Basophils Absolute: 0 10*3/uL (ref 0.0–0.2)
Basos: 1 %
EOS (ABSOLUTE): 0.1 10*3/uL (ref 0.0–0.4)
Eos: 1 %
Hematocrit: 46.8 % — ABNORMAL HIGH (ref 34.0–46.6)
Hemoglobin: 15.4 g/dL (ref 11.1–15.9)
Immature Grans (Abs): 0 10*3/uL (ref 0.0–0.1)
Immature Granulocytes: 0 %
Lymphocytes Absolute: 1.8 10*3/uL (ref 0.7–3.1)
Lymphs: 35 %
MCH: 31.4 pg (ref 26.6–33.0)
MCHC: 32.9 g/dL (ref 31.5–35.7)
MCV: 95 fL (ref 79–97)
Monocytes Absolute: 0.5 10*3/uL (ref 0.1–0.9)
Monocytes: 9 %
Neutrophils Absolute: 2.8 10*3/uL (ref 1.4–7.0)
Neutrophils: 54 %
Platelets: 236 10*3/uL (ref 150–450)
RBC: 4.91 x10E6/uL (ref 3.77–5.28)
RDW: 11.7 % (ref 11.7–15.4)
WBC: 5.1 10*3/uL (ref 3.4–10.8)

## 2021-06-11 LAB — CMP14+EGFR
ALT: 32 IU/L (ref 0–32)
AST: 23 IU/L (ref 0–40)
Albumin/Globulin Ratio: 2.2 (ref 1.2–2.2)
Albumin: 4.7 g/dL (ref 3.8–4.8)
Alkaline Phosphatase: 45 IU/L (ref 44–121)
BUN/Creatinine Ratio: 23 (ref 12–28)
BUN: 16 mg/dL (ref 8–27)
Bilirubin Total: 1.1 mg/dL (ref 0.0–1.2)
CO2: 24 mmol/L (ref 20–29)
Calcium: 9.7 mg/dL (ref 8.7–10.3)
Chloride: 100 mmol/L (ref 96–106)
Creatinine, Ser: 0.71 mg/dL (ref 0.57–1.00)
Globulin, Total: 2.1 g/dL (ref 1.5–4.5)
Glucose: 87 mg/dL (ref 70–99)
Potassium: 4.4 mmol/L (ref 3.5–5.2)
Sodium: 139 mmol/L (ref 134–144)
Total Protein: 6.8 g/dL (ref 6.0–8.5)
eGFR: 95 mL/min/{1.73_m2} (ref >59)

## 2021-06-11 LAB — THYROID PANEL WITH TSH
Free Thyroxine Index: 2.4 (ref 1.2–4.9)
T3 Uptake Ratio: 31 % (ref 24–39)
T4, Total: 7.9 ug/dL (ref 4.5–12.0)
TSH: 1.45 u[IU]/mL (ref 0.450–4.500)

## 2021-06-11 LAB — LIPID PANEL
Chol/HDL Ratio: 2.5 ratio (ref 0.0–4.4)
Cholesterol, Total: 198 mg/dL (ref 100–199)
HDL: 78 mg/dL (ref >39)
LDL Chol Calc (NIH): 100 mg/dL — ABNORMAL HIGH (ref 0–99)
Triglycerides: 118 mg/dL (ref 0–149)
VLDL Cholesterol Cal: 20 mg/dL (ref 5–40)

## 2021-06-11 NOTE — Progress Notes (Signed)
Hello Maryjo,  Your lab result is normal and/or stable.Some minor variations that are not significant are commonly marked abnormal, but do not represent any medical problem for you.  Best regards, [name], M.D.

## 2021-06-13 ENCOUNTER — Other Ambulatory Visit: Payer: Self-pay

## 2021-06-13 ENCOUNTER — Ambulatory Visit (INDEPENDENT_AMBULATORY_CARE_PROVIDER_SITE_OTHER): Payer: BC Managed Care – PPO | Admitting: Family Medicine

## 2021-06-13 ENCOUNTER — Encounter: Payer: Self-pay | Admitting: Family Medicine

## 2021-06-13 VITALS — BP 118/76 | HR 79 | Temp 97.7°F | Ht 65.2 in | Wt 199.8 lb

## 2021-06-13 DIAGNOSIS — I10 Essential (primary) hypertension: Secondary | ICD-10-CM | POA: Diagnosis not present

## 2021-06-13 DIAGNOSIS — E782 Mixed hyperlipidemia: Secondary | ICD-10-CM | POA: Diagnosis not present

## 2021-06-13 DIAGNOSIS — Z01818 Encounter for other preprocedural examination: Secondary | ICD-10-CM | POA: Diagnosis not present

## 2021-06-13 DIAGNOSIS — E119 Type 2 diabetes mellitus without complications: Secondary | ICD-10-CM

## 2021-06-13 DIAGNOSIS — Z23 Encounter for immunization: Secondary | ICD-10-CM | POA: Diagnosis not present

## 2021-06-13 NOTE — Progress Notes (Signed)
Subjective:  Patient ID: Lauren Shannon, female    DOB: [birth year]  Age: 65 y.o. MRN: [id number]  CC: Pre-op Exam   HPI Lauren Shannon presents for preoperative clearance. Planing left TKA for next month. Having a lot of pain. Using the celebrex, but dropped Lyrica. Doing well without it. She takes ASA 81 mg daily.  Follow-up of diabetes. Patient denies symptoms such as polyuria, polydipsia, excessive hunger, nausea. No significant hypoglycemic spells noted. Medications reviewed. Pt reports taking them regularly without complication/adverse reaction being reported today.  Checking feet daily.   History Flannery has a past medical history of Allergy, Arthritis, Basal cell carcinoma, COVID-19 (08/2020), Essential hypertension, Headache, History of pneumonia (1963), Low back pain, Mixed hyperlipidemia, Routine gynecological examination (08/22/2013), and Type 2 diabetes mellitus ([name]).   She has a past surgical history that includes Thyroid surgery (2005); Uterine polyp removal; Tubal ligation (1993); Cholecystectomy; Appendectomy; Tonsillectomy; Plantar fascia surgery (Right, 2012); stimulation system implant (2015); and Colonoscopy (N/A, 01/28/2018).   Her family history includes Diabetes in her mother; Heart disease in her brother and father; Hypertension in her mother.She reports that she has never smoked. She has never used smokeless tobacco. She reports current alcohol use. She reports that she does not use drugs.  Current Outpatient Medications on File Prior to Visit  Medication Sig Dispense Refill   albuterol (VENTOLIN HFA) 108 (90 Base) MCG/ACT inhaler Inhale 2 puffs into the lungs every 6 (six) hours as needed for wheezing or shortness of breath. 18 g 1   alendronate (FOSAMAX) 70 MG tablet Take 1 tablet (70 mg total) by mouth every 7 (seven) days. Take with a full glass of water on an empty stomach. Do not lay down for at least 2 hours 4 tablet 11   aspirin 81 MG tablet Take 81 mg by mouth  every evening.      atorvastatin (LIPITOR) 40 MG tablet TAKE 1 TABLET DAILY 90 tablet 0   blood glucose meter kit and supplies KIT Dispense based on patient and insurance preference. Use up to four times daily as directed. (FOR ICD-10 : E11.9 1 each 0   Calcium Citrate-Vitamin D (CALCIUM + D PO) Take 1 tablet by mouth daily.     celecoxib (CELEBREX) 400 MG capsule Take 1 capsule (400 mg total) by mouth daily. With food 90 capsule 1   levothyroxine (SYNTHROID) 50 MCG tablet TAKE 1 TABLET DAILY 90 tablet 2   mirabegron ER (MYRBETRIQ) 50 MG TB24 tablet Take 1 tablet (50 mg total) by mouth daily. 90 tablet 1   Multiple Vitamin (MULTIVITAMIN) capsule Take 1 capsule by mouth daily.     TRULICITY 8.54 OE/7.0JJ SOPN INJECT 0.75MG ONCE A WEEK 2 mL 2   vitamin B-12 (CYANOCOBALAMIN) 1000 MCG tablet Take 2,000 mcg by mouth daily.     No current facility-administered medications on file prior to visit.    ROS Review of Systems  Constitutional: Negative.   HENT: Negative.    Eyes:  Negative for visual disturbance.  Respiratory:  Negative for shortness of breath.   Cardiovascular:  Negative for chest pain.  Gastrointestinal:  Negative for abdominal pain.  Genitourinary:  Negative for difficulty urinating.  Musculoskeletal:  Positive for arthralgias. Negative for myalgias.  Neurological:  Negative for headaches.  Psychiatric/Behavioral:  Negative for sleep disturbance.    Objective:  BP 118/76   Pulse 79   Temp 97.7 F (36.5 C)   Ht 5' 5.2" (1.656 m)   Wt 199 lb 12.8 oz (90.6  kg)   SpO2 97%   BMI 33.04 kg/m   BP Readings from Last 3 Encounters:  06/13/21 118/76  02/28/21 104/70  02/19/21 108/69    Wt Readings from Last 3 Encounters:  06/13/21 199 lb 12.8 oz (90.6 kg)  02/28/21 203 lb (92.1 kg)  02/19/21 203 lb 3.2 oz (92.2 kg)     Physical Exam Constitutional:      General: She is not in acute distress.    Appearance: She is well-developed.  HENT:     Head: Normocephalic and  atraumatic.  Eyes:     Conjunctiva/sclera: Conjunctivae normal.     Pupils: Pupils are equal, round, and reactive to light.  Neck:     Thyroid: No thyromegaly.  Cardiovascular:     Rate and Rhythm: Normal rate and regular rhythm.     Heart sounds: Normal heart sounds. No murmur heard. Pulmonary:     Effort: Pulmonary effort is normal. No respiratory distress.     Breath sounds: Normal breath sounds. No wheezing or rales.  Abdominal:     General: Bowel sounds are normal. There is no distension.     Palpations: Abdomen is soft.     Tenderness: There is no abdominal tenderness.  Musculoskeletal:        General: Normal range of motion.     Cervical back: Normal range of motion and neck supple.  Lymphadenopathy:     Cervical: No cervical adenopathy.  Skin:    General: Skin is warm and dry.  Neurological:     Mental Status: She is alert and oriented to person, place, and time.  Psychiatric:        Behavior: Behavior normal.        Thought Content: Thought content normal.        Judgment: Judgment normal.   Results for orders placed or performed in visit on 06/10/21  Thyroid Panel With TSH  Result Value Ref Range   TSH 1.450 0.450 - 4.500 uIU/mL   T4, Total 7.9 4.5 - 12.0 ug/dL   T3 Uptake Ratio 31 24 - 39 %   Free Thyroxine Index 2.4 1.2 - 4.9  Lipid panel  Result Value Ref Range   Cholesterol, Total 198 100 - 199 mg/dL   Triglycerides 118 0 - 149 mg/dL   HDL 78 >39 mg/dL   VLDL Cholesterol Cal 20 5 - 40 mg/dL   LDL Chol Calc (NIH) 100 (H) 0 - 99 mg/dL   Chol/HDL Ratio 2.5 0.0 - 4.4 ratio  CMP14+EGFR  Result Value Ref Range   Glucose 87 70 - 99 mg/dL   BUN 16 8 - 27 mg/dL   Creatinine, Ser 0.71 0.57 - 1.00 mg/dL   eGFR 95 >59 mL/min/1.73   BUN/Creatinine Ratio 23 12 - 28   Sodium 139 134 - 144 mmol/L   Potassium 4.4 3.5 - 5.2 mmol/L   Chloride 100 96 - 106 mmol/L   CO2 24 20 - 29 mmol/L   Calcium 9.7 8.7 - 10.3 mg/dL   Total Protein 6.8 6.0 - 8.5 g/dL   Albumin 4.7  3.8 - 4.8 g/dL   Globulin, Total 2.1 1.5 - 4.5 g/dL   Albumin/Globulin Ratio 2.2 1.2 - 2.2   Bilirubin Total 1.1 0.0 - 1.2 mg/dL   Alkaline Phosphatase 45 44 - 121 IU/L   AST 23 0 - 40 IU/L   ALT 32 0 - 32 IU/L  CBC with Differential/Platelet  Result Value Ref Range   WBC 5.1 3.4 -  10.8 x10E3/uL   RBC 4.91 3.77 - 5.28 x10E6/uL   Hemoglobin 15.4 11.1 - 15.9 g/dL   Hematocrit 46.8 (H) 34.0 - 46.6 %   MCV 95 79 - 97 fL   MCH 31.4 26.6 - 33.0 pg   MCHC 32.9 31.5 - 35.7 g/dL   RDW 11.7 11.7 - 15.4 %   Platelets 236 150 - 450 x10E3/uL   Neutrophils 54 Not Estab. %   Lymphs 35 Not Estab. %   Monocytes 9 Not Estab. %   Eos 1 Not Estab. %   Basos 1 Not Estab. %   Neutrophils Absolute 2.8 1.4 - 7.0 x10E3/uL   Lymphocytes Absolute 1.8 0.7 - 3.1 x10E3/uL   Monocytes Absolute 0.5 0.1 - 0.9 x10E3/uL   EOS (ABSOLUTE) 0.1 0.0 - 0.4 x10E3/uL   Basophils Absolute 0.0 0.0 - 0.2 x10E3/uL   Immature Granulocytes 0 Not Estab. %   Immature Grans (Abs) 0.0 0.0 - 0.1 x10E3/uL  Bayer DCA Hb A1c Waived  Result Value Ref Range   HB A1C (BAYER DCA - WAIVED) 4.9 4.8 - 5.6 %      Assessment & Plan:   Elianys was seen today for pre-op exam.  Diagnoses and all orders for this visit:  Pre-op testing -     EKG 12-Lead  Type 2 diabetes mellitus without complication, without long-term current use of insulin ([place])  Essential hypertension  Mixed hyperlipidemia  Need for shingles vaccine -     Varicella-zoster vaccine IM (Shingrix)  Need for immunization against influenza -     Flu Vaccine QUAD 53moIM (Fluarix, Fluzone & Alfiuria Quad PF)     I have discontinued KAmariyanaStrive's traZODone, pregabalin, and sulfamethoxazole-trimethoprim. I am also having her maintain her multivitamin, aspirin, Calcium Citrate-Vitamin D (CALCIUM + D PO), blood glucose meter kit and supplies, vitamin B-12, mirabegron ER, alendronate, albuterol, levothyroxine, celecoxib, atorvastatin, and Trulicity.  No orders of the  defined types were placed in this encounter.    Follow-up: Return in about 2 months (around 08/13/2021).  WClaretta Fraise M.D.

## 2021-06-16 ENCOUNTER — Other Ambulatory Visit: Payer: Self-pay

## 2021-06-16 ENCOUNTER — Ambulatory Visit (HOSPITAL_COMMUNITY)
Admission: RE | Admit: 2021-06-16 | Discharge: 2021-06-16 | Disposition: A | Discharge status: Home / Self Care | Payer: BC Managed Care – PPO | Source: Ambulatory Visit | Attending: Obstetrics & Gynecology | Admitting: Obstetrics & Gynecology

## 2021-06-16 DIAGNOSIS — Z1231 Encounter for screening mammogram for malignant neoplasm of breast: Secondary | ICD-10-CM | POA: Diagnosis not present

## 2021-06-27 DIAGNOSIS — M25562 Pain in left knee: Secondary | ICD-10-CM | POA: Diagnosis not present

## 2021-07-04 DIAGNOSIS — N3941 Urge incontinence: Secondary | ICD-10-CM | POA: Diagnosis not present

## 2021-07-10 ENCOUNTER — Other Ambulatory Visit: Payer: Self-pay | Admitting: Family Medicine

## 2021-07-10 DIAGNOSIS — E782 Mixed hyperlipidemia: Secondary | ICD-10-CM

## 2021-07-10 DIAGNOSIS — E119 Type 2 diabetes mellitus without complications: Secondary | ICD-10-CM

## 2021-07-16 DIAGNOSIS — Z01818 Encounter for other preprocedural examination: Secondary | ICD-10-CM | POA: Diagnosis not present

## 2021-07-18 ENCOUNTER — Other Ambulatory Visit: Payer: Self-pay | Admitting: Family Medicine

## 2021-07-18 DIAGNOSIS — M810 Age-related osteoporosis without current pathological fracture: Secondary | ICD-10-CM

## 2021-07-22 DIAGNOSIS — M1712 Unilateral primary osteoarthritis, left knee: Secondary | ICD-10-CM | POA: Diagnosis not present

## 2021-07-22 HISTORY — PX: OTHER SURGICAL HISTORY: SHX169

## 2021-07-23 ENCOUNTER — Ambulatory Visit: Payer: BC Managed Care – PPO | Admitting: Family Medicine

## 2021-08-01 ENCOUNTER — Telehealth: Payer: Self-pay | Admitting: *Deleted

## 2021-08-01 NOTE — Telephone Encounter (Signed)
Trulicity 0.75MG /0.5ML pen-injectors  PA  Key: [id number] Additional Information Required Waiting on question response  Not sent to plan yet

## 2021-08-01 NOTE — Telephone Encounter (Signed)
Approvedtoday Effective from 08/01/2021 through 07/31/2022.  Pharmacy and patient aware

## 2021-08-13 ENCOUNTER — Other Ambulatory Visit: Payer: Self-pay | Admitting: Family Medicine

## 2021-08-18 ENCOUNTER — Encounter: Payer: Self-pay | Admitting: Family Medicine

## 2021-08-18 ENCOUNTER — Ambulatory Visit (INDEPENDENT_AMBULATORY_CARE_PROVIDER_SITE_OTHER): Payer: BC Managed Care – PPO | Admitting: Family Medicine

## 2021-08-18 VITALS — BP 114/70 | HR 87 | Temp 97.4°F | Ht 65.2 in | Wt 202.2 lb

## 2021-08-18 DIAGNOSIS — E119 Type 2 diabetes mellitus without complications: Secondary | ICD-10-CM

## 2021-08-18 DIAGNOSIS — E782 Mixed hyperlipidemia: Secondary | ICD-10-CM

## 2021-08-18 DIAGNOSIS — I10 Essential (primary) hypertension: Secondary | ICD-10-CM | POA: Diagnosis not present

## 2021-08-18 NOTE — Progress Notes (Signed)
Subjective:  Patient ID: Lauren Shannon, female    DOB: 1956/03/07  Age: 65 y.o. MRN: [id number]  CC: Follow-up   HPI Lauren Shannon presents for Four weeks post op for left TKA. Still painful, swollen. Attending PT twice weekly. Walking getting close to normal, with some times of slow and painful ambulation.   Blood sugar has been stable through her pre and postsurgical experience.  She denies any excessive lows that led to symptoms.  Nor has it been higher than expected.  She has not been short of breath or needed to use her inhaler more than usual.  She mentions a few times where she felt like she was not getting enough air and had to take a deep breath.  She does have her albuterol rescue inhaler available. presents for  follow-up of hypertension. Patient has no history of headache chest pain or shortness of breath or recent cough. Patient also denies symptoms of TIA such as focal numbness or weakness. Patient denies side effects from medication. States taking it regularly.     Depression screen [facility] 2/9 08/18/2021 08/18/2021 06/13/2021  Decreased Interest 0 0 2  Down, Depressed, Hopeless 0 0 0  PHQ - 2 Score 0 0 2  Altered sleeping 0 - 0  Tired, decreased energy 1 - 1  Change in appetite 0 - 1  Feeling bad or failure about yourself  0 - 0  Trouble concentrating 0 - 0  Moving slowly or fidgety/restless 0 - 0  Suicidal thoughts 0 - 0  PHQ-9 Score 1 - 4  Difficult doing work/chores Not difficult at all - Somewhat difficult  Some recent data might be hidden    History Lauren Shannon has a past medical history of Allergy, Arthritis, Basal cell carcinoma, COVID-19 (08/2020), Essential hypertension, Headache, History of pneumonia (1963), Low back pain, Mixed hyperlipidemia, Routine gynecological examination (08/22/2013), and Type 2 diabetes mellitus ([place]).   She has a past surgical history that includes Thyroid surgery (2005); Uterine polyp removal; Tubal ligation (1993); Cholecystectomy;  Appendectomy; Tonsillectomy; Plantar fascia surgery (Right, 2012); stimulation system implant (2015); and Colonoscopy (N/A, 01/28/2018).   Her family history includes Diabetes in her mother; Heart disease in her brother and father; Hypertension in her mother.She reports that she has never smoked. She has never used smokeless tobacco. She reports that she does not currently use alcohol. She reports that she does not use drugs.    ROS Review of Systems  Constitutional: Negative.   HENT: Negative.    Eyes:  Negative for visual disturbance.  Respiratory:  Negative for shortness of breath.   Cardiovascular:  Negative for chest pain.  Gastrointestinal:  Negative for abdominal pain.  Musculoskeletal:  Positive for arthralgias (left knee).   Objective:  BP 114/70    Pulse 87    Temp (!) 97.4 F (36.3 C)    Ht 5' 5.2" (1.656 m)    Wt 202 lb 3.2 oz (91.7 kg)    SpO2 97%    BMI 33.44 kg/m   BP Readings from Last 3 Encounters:  08/18/21 114/70  06/13/21 118/76  02/28/21 104/70    Wt Readings from Last 3 Encounters:  08/18/21 202 lb 3.2 oz (91.7 kg)  06/13/21 199 lb 12.8 oz (90.6 kg)  02/28/21 203 lb (92.1 kg)     Physical Exam Constitutional:      General: She is not in acute distress.    Appearance: She is well-developed.  Cardiovascular:     Rate and Rhythm: Normal  and regular rhythm.  °Pulmonary:  °   Breath sounds: Normal breath sounds.  °Musculoskeletal:     °   General: Deformity (2-3+edema of the left knee. Tender with moderate ROM) present. Normal range of motion.  °Skin: °   General: Skin is warm and dry.  °Neurological:  °   Mental Status: She is alert and oriented to person, place, and time.  ° ° ° ° °Assessment & Plan:  ° °[name] was seen today for follow-up. ° °Diagnoses and all orders for this visit: ° °Essential hypertension ° °Type 2 diabetes mellitus without complication, without long-term current use of insulin (HCC) ° °Mixed hyperlipidemia ° ° ° ° ° ° °I am having  Lauren Shannon maintain her multivitamin, aspirin, Calcium Citrate-Vitamin D (CALCIUM + D PO), blood glucose meter kit and supplies, vitamin B-12, mirabegron ER, albuterol, levothyroxine, Trulicity, atorvastatin, alendronate, celecoxib, oxyCODONE, methocarbamol, and traMADol. ° °Allergies as of 08/18/2021   ° °   Reactions  ° Oxybutynin Shortness Of Breath, Palpitations  ° °  ° °  °Medication List  °  ° °  ° Accurate as of August 18, 2021  6:55 PM. If you have any questions, ask your nurse or doctor.  °  °  ° °  ° °albuterol 108 (90 Base) MCG/ACT inhaler °Commonly known as: VENTOLIN HFA °Inhale 2 puffs into the lungs every 6 (six) hours as needed for wheezing or shortness of breath. °  °alendronate 70 MG tablet °Commonly known as: FOSAMAX °TAKE 1 TABLET ONCE A WEEK ON AN EMPTY STOMACH °  °aspirin 81 MG tablet °Take 81 mg by mouth every evening. °  °atorvastatin 40 MG tablet °Commonly known as: LIPITOR °TAKE 1 TABLET DAILY °  °blood glucose meter kit and supplies Kit °Dispense based on patient and insurance preference. Use up to four times daily as directed. (FOR ICD-10 : E11.9 °  °CALCIUM + D PO °Take 1 tablet by mouth daily. °  °celecoxib 400 MG capsule °Commonly known as: CELEBREX °TAKE 1 CAPSULE DAILY WITH FOOD °  °levothyroxine 50 MCG tablet °Commonly known as: SYNTHROID °TAKE 1 TABLET DAILY °  °methocarbamol 500 MG tablet °Commonly known as: ROBAXIN °Take 500 mg by mouth 4 (four) times daily. °  °mirabegron ER 50 MG Tb24 tablet °Commonly known as: Myrbetriq °Take 1 tablet (50 mg total) by mouth daily. °  °multivitamin capsule °Take 1 capsule by mouth daily. °  °oxyCODONE 5 MG immediate release tablet °Commonly known as: Oxy IR/ROXICODONE °Take 5 mg by mouth every 4 (four) hours as needed for severe pain. °  °traMADol 50 MG tablet °Commonly known as: ULTRAM °Take by mouth every 6 (six) hours as needed. °  °Trulicity 0.75 MG/0.5ML Sopn °Generic drug: Dulaglutide °INJECT 0.75MG ONCE A WEEK °  °vitamin B-12 1000  MCG tablet °Commonly known as: CYANOCOBALAMIN °Take 2,000 mcg by mouth daily. °  ° °  ° ° ° °Follow-up: Return in about 4 months (around 12/17/2021). ° °Lauren Shannon, M.D. °

## 2021-08-26 ENCOUNTER — Encounter: Payer: Self-pay | Admitting: Family Medicine

## 2021-08-26 DIAGNOSIS — E119 Type 2 diabetes mellitus without complications: Secondary | ICD-10-CM

## 2021-08-26 MED ORDER — TRULICITY 0.75 MG/0.5ML ~~LOC~~ SOAJ: SUBCUTANEOUS | 2 refills | Status: DC

## 2021-10-13 ENCOUNTER — Other Ambulatory Visit: Payer: Self-pay | Admitting: Family Medicine

## 2021-10-13 DIAGNOSIS — E039 Hypothyroidism, unspecified: Secondary | ICD-10-CM

## 2021-11-13 ENCOUNTER — Encounter: Payer: Self-pay | Admitting: Family

## 2021-11-13 ENCOUNTER — Ambulatory Visit (INDEPENDENT_AMBULATORY_CARE_PROVIDER_SITE_OTHER): Payer: Medicare Other | Admitting: Family

## 2021-11-13 VITALS — BP 114/69 | HR 92 | Temp 97.9°F | Ht 65.2 in | Wt 199.4 lb

## 2021-11-13 DIAGNOSIS — J209 Acute bronchitis, unspecified: Secondary | ICD-10-CM | POA: Diagnosis not present

## 2021-11-13 DIAGNOSIS — R051 Acute cough: Secondary | ICD-10-CM | POA: Diagnosis not present

## 2021-11-13 MED ORDER — CETIRIZINE HCL 10 MG PO TABS: 10.0000 mg | ORAL_TABLET | Freq: Every day | ORAL | 1 refills | Status: DC

## 2021-11-13 MED ORDER — FLUTICASONE PROPIONATE 50 MCG/ACT NA SUSP: 2.0000 | Freq: Every day | NASAL | 6 refills | Status: DC

## 2021-11-13 MED ORDER — BENZONATATE 200 MG PO CAPS: 200.0000 mg | ORAL_CAPSULE | Freq: Three times a day (TID) | ORAL | 1 refills | Status: DC | PRN

## 2021-11-13 NOTE — Patient Instructions (Signed)
Acute Bronchitis, Adult °Acute bronchitis is sudden inflammation of the main airways (bronchi) that come off the windpipe (trachea) in the lungs. The swelling causes the airways to get smaller and make more mucus than normal. This can make it hard to breathe and can cause coughing or noisy breathing (wheezing). °Acute bronchitis may last several weeks. The cough may last longer. Allergies, asthma, and exposure to smoke may make the condition worse. °What are the causes? °This condition can be caused by germs and by substances that irritate the lungs, including: °Cold and flu viruses. The most common cause of this condition is the virus that causes the common cold. °Bacteria. This is less common. °Breathing in substances that irritate the lungs, including: °Smoke from cigarettes and other forms of tobacco. °Dust and pollen. °Fumes from household cleaning products, gases, or burned fuel. °Indoor or outdoor air pollution. °What increases the risk? °The following factors may make you more likely to develop this condition: °A weak body's defense system, also called the immune system. °A condition that affects your lungs and breathing, such as asthma. °What are the signs or symptoms? °Common symptoms of this condition include: °Coughing. This may bring up clear, yellow, or green mucus from your lungs (sputum). °Wheezing. °Runny or stuffy nose. °Having too much mucus in your lungs (chest congestion). °Shortness of breath. °Aches and pains, including sore throat or chest. °How is this diagnosed? °This condition is usually diagnosed based on: °Your symptoms and medical history. °A physical exam. °You may also have other tests, including tests to rule out other conditions, such as pneumonia. These tests include: °A test of lung function. °Test of a mucus sample to look for the presence of bacteria. °Tests to check the oxygen level in your blood. °Blood tests. °Chest X-ray. °How is this treated? °Most cases of acute bronchitis  clear up over time without treatment. Your health care provider may recommend: °Drinking more fluids to help thin your mucus so it is easier to cough up. °Taking inhaled medicine (inhaler) to improve air flow in and out of your lungs. °Using a vaporizer or a humidifier. These are machines that add water to the air to help you breathe better. °Taking a medicine that thins mucus and clears congestion (expectorant). °Taking a medicine that prevents or stops coughing (cough suppressant). °It is notcommon to take an antibiotic medicine for this condition. °Follow these instructions at home: ° °Take over-the-counter and prescription medicines only as told by your health care provider. °Use an inhaler, vaporizer, or humidifier as told by your health care provider. °Take two teaspoons (10 mL) of honey at bedtime to lessen coughing at night. °Drink enough fluid to keep your urine pale yellow. °Do not use any products that contain nicotine or tobacco. These products include cigarettes, chewing tobacco, and vaping devices, such as e-cigarettes. If you need help quitting, ask your health care provider. °Get plenty of rest. °Return to your normal activities as told by your health care provider. Ask your health care provider what activities are safe for you. °Keep all follow-up visits. This is important. °How is this prevented? °To lower your risk of getting this condition again: °Wash your hands often with soap and water for at least 20 seconds. If soap and water are not available, use hand sanitizer. °Avoid contact with people who have cold symptoms. °Try not to touch your mouth, nose, or eyes with your hands. °Avoid breathing in smoke or chemical fumes. Breathing smoke or chemical fumes will make your condition   worse. °Get the flu shot every year. °Contact a health care provider if: °Your symptoms do not improve after 2 weeks. °You have trouble coughing up the mucus. °Your cough keeps you awake at night. °You have a  fever. °Get help right away if you: °Cough up blood. °Feel pain in your chest. °Have severe shortness of breath. °Faint or keep feeling like you are going to faint. °Have a severe headache. °Have a fever or chills that get worse. °These symptoms may represent a serious problem that is an emergency. Do not wait to see if the symptoms will go away. Get medical help right away. Call your local emergency services (911 in the [place] Do not drive yourself to the hospital. °Summary °Acute bronchitis is inflammation of the main airways (bronchi) that come off the windpipe (trachea) in the lungs. The swelling causes the airways to get smaller and make more mucus than normal. °Drinking more fluids can help thin your mucus so it is easier to cough up. °Take over-the-counter and prescription medicines only as told by your health care provider. °Do not use any products that contain nicotine or tobacco. These products include cigarettes, chewing tobacco, and vaping devices, such as e-cigarettes. If you need help quitting, ask your health care provider. °Contact a health care provider if your symptoms do not improve after 2 weeks. °This information is not intended to replace advice given to you by your health care provider. Make sure you discuss any questions you have with your health care provider. °Document Revised: 12/18/2020 Document Reviewed: 12/18/2020 °Elsevier Patient Education © 2022 [employer] ° °

## 2021-11-13 NOTE — Progress Notes (Signed)
? ?Subjective:  ? ? Patient ID: Lauren Shannon, female    DOB: [birth year], 66 y.o.   MRN: [id number] ? ?Chief Complaint  ?Patient presents with  ? Cough  ? Nasal Congestion  ?  Started late Saturday. At home COVID -  ? ?PT presents to the office today with cough that started 4 days ago. She has had a negative COVID test.  ?Cough ?This is a new problem. The current episode started in the past 7 days. The problem has been gradually worsening. The problem occurs every few minutes. The cough is Non-productive. Associated symptoms include ear congestion, ear pain, headaches, myalgias, nasal congestion and shortness of breath. Pertinent negatives include no chills, fever, sore throat (improve) or wheezing. She has tried OTC cough suppressant for the symptoms. The treatment provided mild relief.  ? ? ? ?Review of Systems  ?Constitutional:  Negative for chills and fever.  ?HENT:  Positive for ear pain. Negative for sore throat (improve).   ?Respiratory:  Positive for cough and shortness of breath. Negative for wheezing.   ?Musculoskeletal:  Positive for myalgias.  ?Neurological:  Positive for headaches.  ?All other systems reviewed and are negative. ? ?   ?Objective:  ? Physical Exam ?Vitals reviewed.  ?Constitutional:   ?   General: She is not in acute distress. ?   Appearance: She is well-developed. She is obese.  ?HENT:  ?   Head: Normocephalic and atraumatic.  ?   Right Ear: There is no impacted cerumen.  ?Eyes:  ?   Pupils: Pupils are equal, round, and reactive to light.  ?Neck:  ?   Thyroid: No thyromegaly.  ?Cardiovascular:  ?   Rate and Rhythm: Normal rate and regular rhythm.  ?   Heart sounds: Normal heart sounds. No murmur heard. ?Pulmonary:  ?   Effort: Pulmonary effort is normal. No respiratory distress.  ?   Breath sounds: Normal breath sounds. No wheezing.  ?   Comments: Dry cough ?Abdominal:  ?   General: Bowel sounds are normal. There is no distension.  ?   Palpations: Abdomen is soft.  ?   Tenderness: There is  no abdominal tenderness.  ?Musculoskeletal:     ?   General: No tenderness. Normal range of motion.  ?   Cervical back: Normal range of motion and neck supple.  ?Skin: ?   General: Skin is warm and dry.  ?Neurological:  ?   Mental Status: She is alert and oriented to person, place, and time.  ?   Cranial Nerves: No cranial nerve deficit.  ?   Deep Tendon Reflexes: Reflexes are normal and symmetric.  ?Psychiatric:     ?   Behavior: Behavior normal.     ?   Thought Content: Thought content normal.     ?   Judgment: Judgment normal.  ? ? ? ?BP 114/69   Pulse 92   Temp 97.9 ?F (36.6 ?C) (Temporal)   Ht 5' 5.2" (1.656 m)   Wt 199 lb 6.4 oz (90.4 kg)   BMI 32.98 kg/m?  ? ? ?   ?Assessment & Plan:  ? ?Lauren Shannon comes in today with chief complaint of Cough and Nasal Congestion (Started late Saturday. At home COVID -) ? ? ?Diagnosis and orders addressed: ? ?1. Acute cough ?- benzonatate (TESSALON) 200 MG capsule; Take 1 capsule (200 mg total) by mouth 3 (three) times daily as needed.  Dispense: 30 capsule; Refill: 1 ?- fluticasone (FLONASE) 50 MCG/ACT nasal spray; Place 2  sprays into both nostrils daily.  Dispense: 16 g; Refill: 6 ?- cetirizine (ZYRTEC ALLERGY) 10 MG tablet; Take 1 tablet (10 mg total) by mouth daily.  Dispense: 90 tablet; Refill: 1 ? ?2. Acute bronchitis, unspecified organism ?- Take meds as prescribed ?- Use a cool mist humidifier  ?-Use saline nose sprays frequently ?-Force fluids ?-For any cough or congestion ? Use plain Mucinex- regular strength or max strength is fine ?-For fever or aces or pains- take tylenol or ibuprofen. ?-Throat lozenges if help ?-Follow up if symptoms worsen or do not improve  ?- benzonatate (TESSALON) 200 MG capsule; Take 1 capsule (200 mg total) by mouth 3 (three) times daily as needed.  Dispense: 30 capsule; Refill: 1 ?- fluticasone (FLONASE) 50 MCG/ACT nasal spray; Place 2 sprays into both nostrils daily.  Dispense: 16 g; Refill: 6 ?- cetirizine (ZYRTEC ALLERGY) 10 MG  tablet; Take 1 tablet (10 mg total) by mouth daily.  Dispense: 90 tablet; Refill: 1 ? ? ?Evelina Dun, FNP ? ? ?

## 2021-11-17 ENCOUNTER — Telehealth: Payer: Self-pay | Admitting: Family Medicine

## 2021-11-17 MED ORDER — DOXYCYCLINE HYCLATE 100 MG PO TABS: 100.0000 mg | ORAL_TABLET | Freq: Two times a day (BID) | ORAL | 0 refills | Status: DC

## 2021-11-17 NOTE — Telephone Encounter (Signed)
Patient aware and verbalized understanding. °

## 2021-11-17 NOTE — Telephone Encounter (Signed)
Doxycycline Prescription sent to pharmacy   

## 2021-11-20 ENCOUNTER — Other Ambulatory Visit: Payer: Self-pay | Admitting: Family Medicine

## 2021-11-20 DIAGNOSIS — E119 Type 2 diabetes mellitus without complications: Secondary | ICD-10-CM

## 2021-11-22 ENCOUNTER — Other Ambulatory Visit: Payer: Self-pay | Admitting: Family

## 2021-11-22 DIAGNOSIS — J209 Acute bronchitis, unspecified: Secondary | ICD-10-CM

## 2021-11-22 DIAGNOSIS — R051 Acute cough: Secondary | ICD-10-CM

## 2021-12-02 ENCOUNTER — Encounter: Payer: Self-pay | Admitting: Family Medicine

## 2021-12-03 ENCOUNTER — Other Ambulatory Visit: Payer: Self-pay | Admitting: *Deleted

## 2021-12-03 DIAGNOSIS — E782 Mixed hyperlipidemia: Secondary | ICD-10-CM

## 2021-12-03 DIAGNOSIS — E039 Hypothyroidism, unspecified: Secondary | ICD-10-CM

## 2021-12-03 DIAGNOSIS — I10 Essential (primary) hypertension: Secondary | ICD-10-CM

## 2021-12-03 DIAGNOSIS — E119 Type 2 diabetes mellitus without complications: Secondary | ICD-10-CM

## 2021-12-10 ENCOUNTER — Other Ambulatory Visit: Payer: Medicare Other

## 2021-12-10 DIAGNOSIS — E119 Type 2 diabetes mellitus without complications: Secondary | ICD-10-CM

## 2021-12-10 DIAGNOSIS — I10 Essential (primary) hypertension: Secondary | ICD-10-CM

## 2021-12-10 DIAGNOSIS — E039 Hypothyroidism, unspecified: Secondary | ICD-10-CM

## 2021-12-10 DIAGNOSIS — E782 Mixed hyperlipidemia: Secondary | ICD-10-CM

## 2021-12-10 LAB — BAYER DCA HB A1C WAIVED: HB A1C (BAYER DCA - WAIVED): 5.2 % (ref 4.8–5.6)

## 2021-12-11 LAB — THYROID PANEL WITH TSH
Free Thyroxine Index: 2.2 (ref 1.2–4.9)
T3 Uptake Ratio: 30 % (ref 24–39)
T4, Total: 7.3 ug/dL (ref 4.5–12.0)
TSH: 1.54 u[IU]/mL (ref 0.450–4.500)

## 2021-12-11 LAB — CBC WITH DIFFERENTIAL/PLATELET
Basophils Absolute: 0 10*3/uL (ref 0.0–0.2)
Basos: 1 %
EOS (ABSOLUTE): 0.1 10*3/uL (ref 0.0–0.4)
Eos: 2 %
Hematocrit: 45.2 % (ref 34.0–46.6)
Hemoglobin: 14.8 g/dL (ref 11.1–15.9)
Immature Grans (Abs): 0 10*3/uL (ref 0.0–0.1)
Immature Granulocytes: 0 %
Lymphocytes Absolute: 1.5 10*3/uL (ref 0.7–3.1)
Lymphs: 36 %
MCH: 30.6 pg (ref 26.6–33.0)
MCHC: 32.7 g/dL (ref 31.5–35.7)
MCV: 93 fL (ref 79–97)
Monocytes Absolute: 0.4 10*3/uL (ref 0.1–0.9)
Monocytes: 10 %
Neutrophils Absolute: 2.2 10*3/uL (ref 1.4–7.0)
Neutrophils: 51 %
Platelets: 235 10*3/uL (ref 150–450)
RBC: 4.84 x10E6/uL (ref 3.77–5.28)
RDW: 12.1 % (ref 11.7–15.4)
WBC: 4.3 10*3/uL (ref 3.4–10.8)

## 2021-12-11 LAB — LIPID PANEL
Chol/HDL Ratio: 2.4 ratio (ref 0.0–4.4)
Cholesterol, Total: 179 mg/dL (ref 100–199)
HDL: 76 mg/dL (ref >39)
LDL Chol Calc (NIH): 87 mg/dL (ref 0–99)
Triglycerides: 89 mg/dL (ref 0–149)
VLDL Cholesterol Cal: 16 mg/dL (ref 5–40)

## 2021-12-11 LAB — CMP14+EGFR
ALT: 23 IU/L (ref 0–32)
AST: 22 IU/L (ref 0–40)
Albumin/Globulin Ratio: 1.9 (ref 1.2–2.2)
Albumin: 4.5 g/dL (ref 3.8–4.8)
Alkaline Phosphatase: 53 IU/L (ref 44–121)
BUN/Creatinine Ratio: 18 (ref 12–28)
BUN: 13 mg/dL (ref 8–27)
Bilirubin Total: 1.3 mg/dL — ABNORMAL HIGH (ref 0.0–1.2)
CO2: 25 mmol/L (ref 20–29)
Calcium: 9.6 mg/dL (ref 8.7–10.3)
Chloride: 102 mmol/L (ref 96–106)
Creatinine, Ser: 0.72 mg/dL (ref 0.57–1.00)
Globulin, Total: 2.4 g/dL (ref 1.5–4.5)
Glucose: 85 mg/dL (ref 70–99)
Potassium: 4.9 mmol/L (ref 3.5–5.2)
Sodium: 140 mmol/L (ref 134–144)
Total Protein: 6.9 g/dL (ref 6.0–8.5)
eGFR: 93 mL/min/{1.73_m2} (ref >59)

## 2021-12-17 ENCOUNTER — Ambulatory Visit (INDEPENDENT_AMBULATORY_CARE_PROVIDER_SITE_OTHER): Payer: Medicare Other | Admitting: Family Medicine

## 2021-12-17 ENCOUNTER — Encounter: Payer: Self-pay | Admitting: Family Medicine

## 2021-12-17 VITALS — BP 114/75 | HR 87 | Temp 97.7°F | Ht 65.2 in | Wt 201.0 lb

## 2021-12-17 DIAGNOSIS — E782 Mixed hyperlipidemia: Secondary | ICD-10-CM | POA: Diagnosis not present

## 2021-12-17 DIAGNOSIS — M810 Age-related osteoporosis without current pathological fracture: Secondary | ICD-10-CM

## 2021-12-17 DIAGNOSIS — E119 Type 2 diabetes mellitus without complications: Secondary | ICD-10-CM

## 2021-12-17 DIAGNOSIS — Z23 Encounter for immunization: Secondary | ICD-10-CM | POA: Diagnosis not present

## 2021-12-17 MED ORDER — ALENDRONATE SODIUM 70 MG PO TABS: ORAL_TABLET | ORAL | 3 refills | Status: DC

## 2021-12-17 MED ORDER — ATORVASTATIN CALCIUM 40 MG PO TABS: 40.0000 mg | ORAL_TABLET | Freq: Every day | ORAL | 3 refills | Status: DC

## 2021-12-17 NOTE — Progress Notes (Signed)
? ?Subjective:  ?Patient ID: Lauren Shannon, female    DOB: 09-17-1955  Age: 66 y.o. MRN: [id number] ? ?CC: Medical Management of Chronic Issues ? ? ?HPI ?Lauren Shannon presents for presents forFollow-up of diabetes. Patient checks blood sugar at home.  ? 84 fasting and not checking postprandial ?Patient denies symptoms such as polyuria, polydipsia, excessive hunger, nausea ?No significant hypoglycemic spells noted. ?Medications reviewed. Pt reports taking them regularly without complication/adverse reaction being reported today.  ?Checking feet daily. ? ? ? ? ?  12/17/2021  ?  8:45 AM 12/17/2021  ?  8:34 AM 08/18/2021  ? 10:38 AM  ?Depression screen PHQ 2/9  ?Decreased Interest 1 0 0  ?Down, Depressed, Hopeless 1 0 0  ?PHQ - 2 Score 2 0 0  ?Altered sleeping 1  0  ?Tired, decreased energy 1  1  ?Change in appetite 1  0  ?Feeling bad or failure about yourself  1  0  ?Trouble concentrating 0  0  ?Moving slowly or fidgety/restless 0  0  ?Suicidal thoughts 0  0  ?PHQ-9 Score 6  1  ?Difficult doing work/chores Somewhat difficult  Not difficult at all  ? ? ?History ?Lauren Shannon has a past medical history of Allergy, Arthritis, Basal cell carcinoma, COVID-19 (08/2020), Essential hypertension, Headache, History of pneumonia (1963), Low back pain, Mixed hyperlipidemia, Routine gynecological examination (08/22/2013), and Type 2 diabetes mellitus ([place]).  ? ?She has a past surgical history that includes Thyroid surgery (2005); Uterine polyp removal; Tubal ligation (1993); Cholecystectomy; Appendectomy; Tonsillectomy; Plantar fascia surgery (Right, 2012); stimulation system implant (2015); and Colonoscopy (N/A, 01/28/2018).  ? ?Her family history includes Diabetes in her mother; Heart disease in her brother and father; Hypertension in her mother.She reports that she has never smoked. She has never used smokeless tobacco. She reports that she does not currently use alcohol. She reports that she does not use drugs. ? ? ? ?ROS ?Review of  Systems  ?Constitutional: Negative.   ?HENT: Negative.    ?Eyes:  Negative for visual disturbance.  ?Respiratory:  Negative for shortness of breath.   ?Cardiovascular:  Negative for chest pain.  ?Gastrointestinal:  Negative for abdominal pain.  ?Musculoskeletal:  Negative for arthralgias.  ? ?Objective:  ?BP 114/75   Pulse 87   Temp 97.7 ?F (36.5 ?C)   Ht 5' 5.2" (1.656 m)   Wt 201 lb (91.2 kg)   SpO2 97%   BMI 33.24 kg/m?  ? ?BP Readings from Last 3 Encounters:  ?12/17/21 114/75  ?11/13/21 114/69  ?08/18/21 114/70  ? ? ?Wt Readings from Last 3 Encounters:  ?12/17/21 201 lb (91.2 kg)  ?11/13/21 199 lb 6.4 oz (90.4 kg)  ?08/18/21 202 lb 3.2 oz (91.7 kg)  ? ? ? ?Physical Exam ?Constitutional:   ?   General: She is not in acute distress. ?   Appearance: She is well-developed.  ?Cardiovascular:  ?   Rate and Rhythm: Normal rate and regular rhythm.  ?Pulmonary:  ?   Breath sounds: Normal breath sounds.  ?Musculoskeletal:     ?   General: Normal range of motion.  ?Skin: ?   General: Skin is warm and dry.  ?Neurological:  ?   Mental Status: She is alert and oriented to person, place, and time.  ? ? ? ? ?Assessment & Plan:  ? ?Lauren Shannon was seen today for medical management of chronic issues. ? ?Diagnoses and all orders for this visit: ? ?Type 2 diabetes mellitus without complication, without long-term current use of  insulin (HCC) ?-     atorvastatin (LIPITOR) 40 MG tablet; Take 1 tablet (40 mg total) by mouth daily. ?-     Microalbumin / creatinine urine ratio ? ?Mixed hyperlipidemia ?-     atorvastatin (LIPITOR) 40 MG tablet; Take 1 tablet (40 mg total) by mouth daily. ? ?Osteoporosis, unspecified osteoporosis type, unspecified pathological fracture presence ?-     alendronate (FOSAMAX) 70 MG tablet; TAKE 1 TABLET ONCE A WEEK ON AN EMPTY STOMACH ? ? ? ? ? ? ?I have changed Lauren Shannon's atorvastatin. I am also having her maintain her multivitamin, aspirin, Calcium Citrate-Vitamin D (CALCIUM + D PO), blood glucose meter  kit and supplies, vitamin B-12, mirabegron ER, celecoxib, levothyroxine, benzonatate, fluticasone, cetirizine, doxycycline, Trulicity, and alendronate. ? ?Allergies as of 12/17/2021   ? ?   Reactions  ? Oxybutynin Shortness Of Breath, Palpitations  ? ?  ? ?  ?Medication List  ?  ? ?  ? Accurate as of December 17, 2021 11:14 AM. If you have any questions, ask your nurse or doctor.  ?  ?  ? ?  ? ?alendronate 70 MG tablet ?Commonly known as: FOSAMAX ?TAKE 1 TABLET ONCE A WEEK ON AN EMPTY STOMACH ?  ?aspirin 81 MG tablet ?Take 81 mg by mouth every evening. ?  ?atorvastatin 40 MG tablet ?Commonly known as: LIPITOR ?Take 1 tablet (40 mg total) by mouth daily. ?  ?benzonatate 200 MG capsule ?Commonly known as: TESSALON ?Take 1 capsule (200 mg total) by mouth 3 (three) times daily as needed. ?  ?blood glucose meter kit and supplies Kit ?Dispense based on patient and insurance preference. Use up to four times daily as directed. (FOR ICD-10 : E11.9 ?  ?CALCIUM + D PO ?Take 1 tablet by mouth daily. ?  ?celecoxib 400 MG capsule ?Commonly known as: CELEBREX ?TAKE 1 CAPSULE DAILY WITH FOOD ?  ?cetirizine 10 MG tablet ?Commonly known as: ZyrTEC Allergy ?Take 1 tablet (10 mg total) by mouth daily. ?  ?doxycycline 100 MG tablet ?Commonly known as: VIBRA-TABS ?Take 1 tablet (100 mg total) by mouth 2 (two) times daily. ?  ?fluticasone 50 MCG/ACT nasal spray ?Commonly known as: FLONASE ?Place 2 sprays into both nostrils daily. ?  ?levothyroxine 50 MCG tablet ?Commonly known as: SYNTHROID ?TAKE 1 TABLET DAILY ?  ?mirabegron ER 50 MG Tb24 tablet ?Commonly known as: Myrbetriq ?Take 1 tablet (50 mg total) by mouth daily. ?  ?multivitamin capsule ?Take 1 capsule by mouth daily. ?  ?Trulicity 1.69 IH/0.3UU Sopn ?Generic drug: Dulaglutide ?INJECT 0.75 MG ONCE A WEEK ?  ?vitamin B-12 1000 MCG tablet ?Commonly known as: CYANOCOBALAMIN ?Take 2,000 mcg by mouth daily. ?  ? ?  ? ? ? ?Follow-up: Return in about 6 months (around 06/18/2022). ? ?[name], M.D. ?

## 2021-12-17 NOTE — Addendum Note (Signed)
Addended by: Baldomero Lamy B on: 12/17/2021 11:24 AM ? ? Modules accepted: Orders ? ?

## 2021-12-18 LAB — MICROALBUMIN / CREATININE URINE RATIO
Creatinine, Urine: 50.1 mg/dL
Microalb/Creat Ratio: <6 mg/g creat (ref 0–29)
Microalbumin, Urine: <3 ug/mL

## 2021-12-21 NOTE — Progress Notes (Signed)
Hello Careli, ? ?Your lab result is normal and/or stable.Some minor variations that are not significant are commonly marked abnormal, but do not represent any medical problem for you. ? ?Best regards, ?[name], M.D.

## 2022-01-29 ENCOUNTER — Ambulatory Visit (INDEPENDENT_AMBULATORY_CARE_PROVIDER_SITE_OTHER): Payer: Medicare Other | Admitting: Family Medicine

## 2022-01-29 ENCOUNTER — Encounter: Payer: Self-pay | Admitting: Family Medicine

## 2022-01-29 VITALS — BP 106/66 | HR 81 | Temp 98.0°F | Ht 65.2 in | Wt 202.6 lb

## 2022-01-29 DIAGNOSIS — Z0000 Encounter for general adult medical examination without abnormal findings: Secondary | ICD-10-CM | POA: Diagnosis not present

## 2022-01-29 MED ORDER — TRULICITY 1.5 MG/0.5ML ~~LOC~~ SOAJ: SUBCUTANEOUS | 12 refills | Status: DC

## 2022-01-29 NOTE — Progress Notes (Signed)
Annual Wellness Visit     Patient: Lauren Shannon, Female    DOB: 06/23/1956, 66 y.o.   MRN: [id number]  Subjective  Chief Complaint  Patient presents with   WELCOME TO MEDICARE    Lauren Shannon is a 66 y.o. female who presents today for her Annual Wellness Visit. She reports consuming a general diet.  Walking regularly  She generally feels fairly well. She reports sleeping fairly well. She does not have additional problems to discuss today.   HPI Having migraines - two last weekend "hit like a ton of bricks." Excedrin migraine helped.      Medications: Outpatient Medications Prior to Visit  Medication Sig   alendronate (FOSAMAX) 70 MG tablet TAKE 1 TABLET ONCE A WEEK ON AN EMPTY STOMACH   aspirin 81 MG tablet Take 81 mg by mouth every evening.    atorvastatin (LIPITOR) 40 MG tablet Take 1 tablet (40 mg total) by mouth daily.   blood glucose meter kit and supplies KIT Dispense based on patient and insurance preference. Use up to four times daily as directed. (FOR ICD-10 : E11.9   Calcium Citrate-Vitamin D (CALCIUM + D PO) Take 1 tablet by mouth daily.   cetirizine (ZYRTEC ALLERGY) 10 MG tablet Take 1 tablet (10 mg total) by mouth daily.   levothyroxine (SYNTHROID) 50 MCG tablet TAKE 1 TABLET DAILY   Melatonin 10 MG SUBL Place under the tongue at bedtime.   mirabegron ER (MYRBETRIQ) 50 MG TB24 tablet Take 1 tablet (50 mg total) by mouth daily.   Multiple Vitamin (MULTIVITAMIN) capsule Take 1 capsule by mouth daily.   vitamin B-12 (CYANOCOBALAMIN) 1000 MCG tablet Take 2,000 mcg by mouth daily.   [DISCONTINUED] Dulaglutide (TRULICITY) 0.16 PV/3.7SM SOPN INJECT 0.75 MG ONCE A WEEK   [DISCONTINUED] benzonatate (TESSALON) 200 MG capsule Take 1 capsule (200 mg total) by mouth 3 (three) times daily as needed.   [DISCONTINUED] celecoxib (CELEBREX) 400 MG capsule TAKE 1 CAPSULE DAILY WITH FOOD   [DISCONTINUED] doxycycline (VIBRA-TABS) 100 MG tablet Take 1 tablet (100 mg total) by mouth 2  (two) times daily.   [DISCONTINUED] fluticasone (FLONASE) 50 MCG/ACT nasal spray Place 2 sprays into both nostrils daily.   No facility-administered medications prior to visit.    Allergies  Allergen Reactions   Oxybutynin Shortness Of Breath and Palpitations    Patient Care Team: [name], MD as PCP - General (Family Medicine) Satira Sark, MD as Consulting Physician (Cardiology) Danie Binder, MD (Inactive) as Consulting Physician (Gastroenterology)  Review of Systems  Constitutional:  Negative for chills, diaphoresis, fever, malaise/fatigue and weight loss.  HENT:  Negative for congestion, ear pain, hearing loss, nosebleeds, sore throat and tinnitus.   Eyes:  Negative for blurred vision, double vision, photophobia, pain, discharge and redness.  Respiratory:  Negative for cough, hemoptysis, sputum production, shortness of breath and wheezing.   Cardiovascular:  Negative for chest pain, palpitations, orthopnea, leg swelling and PND.  Gastrointestinal:  Negative for abdominal pain, blood in stool, constipation, diarrhea, heartburn, melena, nausea and vomiting.  Genitourinary:  Negative for dysuria, flank pain, frequency, hematuria and urgency.  Musculoskeletal:  Negative for back pain, falls, myalgias and neck pain.  Skin:  Negative for itching and rash.  Neurological:  Negative for dizziness, tingling, tremors, sensory change, speech change, focal weakness, seizures, loss of consciousness, weakness and headaches.  Endo/Heme/Allergies:  Negative for environmental allergies and polydipsia. Does not bruise/bleed easily.  Psychiatric/Behavioral:  Negative for depression, hallucinations, memory loss, substance abuse  and suicidal ideas. The patient is not nervous/anxious and does not have insomnia.        Objective  BP 106/66   Pulse 81   Temp 98 F (36.7 C)   Ht 5' 5.2" (1.656 m)   Wt 202 lb 9.6 oz (91.9 kg)   SpO2 96%   BMI 33.51 kg/m    Physical  Exam Constitutional:      Appearance: Normal appearance.  HENT:     Head: Normocephalic.  Cardiovascular:     Rate and Rhythm: Normal rate and regular rhythm.  Neurological:     Mental Status: She is alert.      Most recent functional status assessment:    01/29/2022   11:10 AM  In your present state of health, do you have any difficulty performing the following activities:  Hearing? 0  Vision? 1  Comment DUE FOR EYE EXAM  Difficulty concentrating or making decisions? 1  Comment CONCENTRATING  Walking or climbing stairs? 1  Comment LITTLE TROUBLE WITH STAIRS, TOTAL KNEE REPLACEMENT  Dressing or bathing? 0  Doing errands, shopping? 0  Preparing Food and eating ? N  Using the Toilet? N  In the past six months, have you accidently leaked urine? Y  Comment NEUROSTIMULATOR AND BLADDER ISSUES, WEARS PADS  Do you have problems with loss of bowel control? N  Managing your Medications? N  Managing your Finances? N  Housekeeping or managing your Housekeeping? Y  Comment LITTLE BIT DUE TO BACK AND KNEE ISSUES   Most recent fall risk assessment:    01/29/2022   11:09 AM  Fall Risk   Falls in the past year? 0    Most recent depression screenings:    01/29/2022   11:09 AM 12/17/2021    8:45 AM  PHQ 2/9 Scores  PHQ - 2 Score 0 2  PHQ- 9 Score  6   Most recent cognitive screening:    01/29/2022   11:13 AM  6CIT Screen  What Year? 0 points  What month? 0 points  What time? 0 points  Count back from 20 0 points  Months in reverse 0 points  Repeat phrase 0 points  Total Score 0 points   Most recent Audit-C alcohol use screening    02/28/2021   12:07 PM  Alcohol Use Disorder Test (AUDIT)  1. How often do you have a drink containing alcohol? 0  2. How many drinks containing alcohol do you have on a typical day when you are drinking? 0  3. How often do you have six or more drinks on one occasion? 0  AUDIT-C Score 0   A score of 3 or more in women, and 4 or more in men  indicates increased risk for alcohol abuse, EXCEPT if all of the points are from question 1      Assessment & Plan   Annual wellness visit done today including the all of the following: Reviewed patient's Family Medical History Reviewed and updated list of patient's medical providers Assessment of cognitive impairment was done Assessed patient's functional ability Established a written schedule for health screening services Health Risk Assessment Completed and Reviewed  Exercise Activities and Dietary recommendations  Goals   None     Immunization History  Administered Date(s) Administered   Influenza,inj,Quad PF,6+ Mos 05/19/2016, 07/29/2017, 05/25/2018, 05/01/2019, 07/24/2020, 06/13/2021   Influenza-Unspecified 06/01/2015, 05/25/2018   Pneumococcal Conjugate-13 05/07/2016   Pneumococcal Polysaccharide-23 05/06/2018   Zoster Recombinat (Shingrix) 06/13/2021, 12/17/2021   Zoster, Live 11/25/2016  Health Maintenance  Topic Date Due   COVID-19 Vaccine (1) 02/14/2022 (Originally 05/27/1956)   HIV Screening  12/18/2022 (Originally 11/25/1971)   OPHTHALMOLOGY EXAM  02/07/2022   INFLUENZA VACCINE  03/31/2022   HEMOGLOBIN A1C  06/11/2022   MAMMOGRAM  06/16/2022   FOOT EXAM  12/18/2022   URINE MICROALBUMIN  12/18/2022   Pneumonia Vaccine 1+ Years old (3) 05/07/2023   TETANUS/TDAP  01/09/2024   PAP SMEAR-Modifier  02/29/2024   COLONOSCOPY (Pts 45-66yr Insurance coverage will need to be confirmed)  12/25/2031   DEXA SCAN  Completed   Hepatitis C Screening  Completed   Zoster Vaccines- Shingrix  Completed   HPV VACCINES  Aged Out     Discussed health benefits of physical activity, and encouraged her to engage in regular exercise appropriate for her age and condition.    Problem List Items Addressed This Visit   None Visit Diagnoses     Welcome to Medicare preventive visit    -  Primary       Return in about 6 months (around 07/31/2022).     WClaretta Fraise  MD

## 2022-02-09 ENCOUNTER — Other Ambulatory Visit (HOSPITAL_COMMUNITY): Payer: Self-pay | Admitting: Obstetrics & Gynecology

## 2022-02-09 DIAGNOSIS — Z1231 Encounter for screening mammogram for malignant neoplasm of breast: Secondary | ICD-10-CM

## 2022-02-10 ENCOUNTER — Other Ambulatory Visit: Payer: Self-pay | Admitting: Family Medicine

## 2022-02-10 DIAGNOSIS — E119 Type 2 diabetes mellitus without complications: Secondary | ICD-10-CM

## 2022-03-05 ENCOUNTER — Encounter: Payer: Self-pay | Admitting: Family Medicine

## 2022-03-05 ENCOUNTER — Ambulatory Visit (INDEPENDENT_AMBULATORY_CARE_PROVIDER_SITE_OTHER): Payer: Medicare Other | Admitting: Family Medicine

## 2022-03-05 VITALS — BP 100/66 | HR 86 | Temp 98.1°F | Ht 65.2 in | Wt 200.0 lb

## 2022-03-05 DIAGNOSIS — J40 Bronchitis, not specified as acute or chronic: Secondary | ICD-10-CM

## 2022-03-05 MED ORDER — PREDNISONE 20 MG PO TABS: 40.0000 mg | ORAL_TABLET | Freq: Every day | ORAL | 0 refills | Status: AC

## 2022-03-05 MED ORDER — BENZONATATE 100 MG PO CAPS: 100.0000 mg | ORAL_CAPSULE | Freq: Three times a day (TID) | ORAL | 0 refills | Status: DC | PRN

## 2022-03-05 NOTE — Progress Notes (Signed)
Acute Office Visit  Subjective:     Patient ID: Lauren Shannon, female    DOB: 1956/04/21, 66 y.o.   MRN: [id number]  Chief Complaint  Patient presents with   URI    Started last Saturday. Negative flu, covid and strep    Cough This is a new problem. The current episode started 1 to 4 weeks ago (9 days). The problem has been unchanged. The problem occurs every few minutes. The cough is Productive of sputum. Associated symptoms include headaches, nasal congestion, postnasal drip and a sore throat. Pertinent negatives include no chest pain, chills, ear congestion, ear pain, fever, heartburn, hemoptysis, myalgias, rash, rhinorrhea, shortness of breath, sweats, weight loss or wheezing. The symptoms are aggravated by lying down. She has tried OTC cough suppressant and a beta-agonist inhaler (augmentin x 5 days, zyrtec) for the symptoms. The treatment provided no relief. Her past medical history is significant for bronchitis, environmental allergies and pneumonia. There is no history of asthma, bronchiectasis, COPD or emphysema.     Review of Systems  Constitutional:  Negative for chills, fever and weight loss.  HENT:  Positive for postnasal drip and sore throat. Negative for ear pain and rhinorrhea.   Respiratory:  Positive for cough. Negative for hemoptysis, shortness of breath and wheezing.   Cardiovascular:  Negative for chest pain.  Gastrointestinal:  Negative for heartburn.  Musculoskeletal:  Negative for myalgias.  Skin:  Negative for rash.  Neurological:  Positive for headaches.  Endo/Heme/Allergies:  Positive for environmental allergies.        Objective:    BP 100/66   Pulse 86   Temp 98.1 F (36.7 C)   Ht 5' 5.2" (1.656 m)   Wt 200 lb (90.7 kg)   BMI 33.08 kg/m    Physical Exam Vitals and nursing note reviewed.  Constitutional:      General: She is not in acute distress.    Appearance: She is not ill-appearing, toxic-appearing or diaphoretic.  HENT:     Head:  Normocephalic and atraumatic.     Right Ear: Tympanic membrane, ear canal and external ear normal.     Left Ear: Tympanic membrane and external ear normal.     Nose: Congestion present.     Right Sinus: Frontal sinus tenderness present. No maxillary sinus tenderness.     Left Sinus: Frontal sinus tenderness present. No maxillary sinus tenderness.     Mouth/Throat:     Mouth: Mucous membranes are moist.     Pharynx: Oropharynx is clear. No oropharyngeal exudate or posterior oropharyngeal erythema.  Cardiovascular:     Rate and Rhythm: Normal rate and regular rhythm.     Heart sounds: Normal heart sounds.  Pulmonary:     Effort: Pulmonary effort is normal. No respiratory distress.     Breath sounds: Normal breath sounds. No stridor. No wheezing, rhonchi or rales.  Chest:     Chest wall: No tenderness.  Musculoskeletal:     Cervical back: Neck supple.     Right lower leg: No edema.     Left lower leg: No edema.  Lymphadenopathy:     Cervical: No cervical adenopathy.  Skin:    General: Skin is warm and dry.  Neurological:     General: No focal deficit present.     Mental Status: She is alert and oriented to person, place, and time.  Psychiatric:        Mood and Affect: Mood normal.        Behavior:  Behavior normal.     No results found for any visits on 03/05/22.      Assessment & Plan:   Natisha was seen today for uri.  Diagnoses and all orders for this visit:  Bronchitis Lungs clear on exam today. Prednisone burst as below. Tessalon perles prn. Continue zyrtec and augmentin. Albuterol prn. Return to office for new or worsening symptoms, or if symptoms persist.  -     benzonatate (TESSALON PERLES) 100 MG capsule; Take 1 capsule (100 mg total) by mouth 3 (three) times daily as needed. -     predniSONE (DELTASONE) 20 MG tablet; Take 2 tablets (40 mg total) by mouth daily with breakfast for 5 days.  Return to office for new or worsening symptoms, or if symptoms persist.    The patient indicates understanding of these issues and agrees with the plan.  Gwenlyn Perking, FNP

## 2022-05-11 ENCOUNTER — Other Ambulatory Visit: Payer: Self-pay | Admitting: Family

## 2022-05-11 DIAGNOSIS — J209 Acute bronchitis, unspecified: Secondary | ICD-10-CM

## 2022-05-11 DIAGNOSIS — R051 Acute cough: Secondary | ICD-10-CM

## 2022-05-18 NOTE — Progress Notes (Unsigned)
Referring-Warren Stacks MD Reason for referral-hyperlipidemia and family history of coronary artery disease  HPI: 66 year old female for evaluation of hyperlipidemia and family history of coronary artery disease at request of [name] MD.  Echocardiogram January 2016 showed normal LV function, mild left atrial enlargement.  Exercise treadmill January 2016 6:04 with no ST changes and no chest pain.  Current Outpatient Medications  Medication Sig Dispense Refill   alendronate (FOSAMAX) 70 MG tablet TAKE 1 TABLET ONCE A WEEK ON AN EMPTY STOMACH 13 tablet 3   amoxicillin-clavulanate (AUGMENTIN) 875-125 MG tablet Take 1 tablet by mouth 2 (two) times daily.     aspirin 81 MG tablet Take 81 mg by mouth every evening.      atorvastatin (LIPITOR) 40 MG tablet Take 1 tablet (40 mg total) by mouth daily. 90 tablet 3   benzonatate (TESSALON PERLES) 100 MG capsule Take 1 capsule (100 mg total) by mouth 3 (three) times daily as needed. 20 capsule 0   blood glucose meter kit and supplies KIT Dispense based on patient and insurance preference. Use up to four times daily as directed. (FOR ICD-10 : E11.9 1 each 0   Calcium Citrate-Vitamin D (CALCIUM + D PO) Take 1 tablet by mouth daily.     cetirizine (ZYRTEC) 10 MG tablet TAKE ONE TABLET ONCE DAILY 90 tablet 1   Dulaglutide (TRULICITY) 1.5 ZD/6.3OV SOPN Inject content of one pen under the skin weekly 2 mL 12   levothyroxine (SYNTHROID) 50 MCG tablet TAKE 1 TABLET DAILY 90 tablet 2   Melatonin 10 MG SUBL Place under the tongue at bedtime.     mirabegron ER (MYRBETRIQ) 50 MG TB24 tablet Take 1 tablet (50 mg total) by mouth daily. 90 tablet 1   Multiple Vitamin (MULTIVITAMIN) capsule Take 1 capsule by mouth daily.     vitamin B-12 (CYANOCOBALAMIN) 1000 MCG tablet Take 2,000 mcg by mouth daily.     No current facility-administered medications for this visit.    Allergies  Allergen Reactions   Oxybutynin Shortness Of Breath and Palpitations      Past Medical History:  Diagnosis Date   Allergy    Arthritis    ostoarthritis   Basal cell carcinoma    COVID-19 08/2020   Essential hypertension    pt states not currently being treated for this   Headache    History of pneumonia 1963   Low back pain    Mixed hyperlipidemia    Routine gynecological examination 08/22/2013   Type 2 diabetes mellitus ([place])    pt stated not currently being treated for this    Past Surgical History:  Procedure Laterality Date   APPENDECTOMY     CHOLECYSTECTOMY     COLONOSCOPY N/A 01/28/2018   Procedure: COLONOSCOPY;  Surgeon: Danie Binder, MD;  Location: [facility];  Service: Endoscopy;  Laterality: N/A;  1:00   LEFT TOTAL ARTHROPLASTY Left 07/22/2021   PLANTAR FASCIA SURGERY Right 08/31/2010   stimulation system implant  08/31/2013   THYROID SURGERY  09/01/2003   TONSILLECTOMY     TUBAL LIGATION  09/01/1991   Uterine polyp removal      Social History   Socioeconomic History   Marital status: Married    Spouse name: JIM   Number of children: 2   Years of education: Not on file   Highest education level: Master's degree (e.g., MA, MS, MEng, MEd, MSW, MBA)  Occupational History   Not on file  Tobacco Use  Smoking status: Never   Smokeless tobacco: Never  Vaping Use   Vaping Use: Never used  Substance and Sexual Activity   Alcohol use: Not Currently   Drug use: No   Sexual activity: Not Currently    Partners: Male  Other Topics Concern   Not on file  Social History Narrative   Not on file   Social Determinants of Health   Financial Resource Strain: Low Risk  (02/28/2021)   Overall Financial Resource Strain (CARDIA)    Difficulty of Paying Living Expenses: Not very hard  Food Insecurity: No Food Insecurity (02/28/2021)   Hunger Vital Sign    Worried About Running Out of Food in the Last Year: Never true    Ran Out of Food in the Last Year: Never true  Transportation Needs: No Transportation Needs (02/28/2021)    PRAPARE - Hydrologist (Medical): No    Lack of Transportation (Non-Medical): No  Physical Activity: Inactive (02/28/2021)   Exercise Vital Sign    Days of Exercise per Week: 0 days    Minutes of Exercise per Session: 0 min  Stress: Stress Concern Present (02/28/2021)   [place]    Feeling of Stress : To some extent  Social Connections: Moderately Integrated (02/28/2021)   Social Connection and Isolation Panel [NHANES]    Frequency of Communication with Friends and Family: Once a week    Frequency of Social Gatherings with Friends and Family: Once a week    Attends Religious Services: More than 4 times per year    Active Member of [employer] or Organizations: Yes    Attends Music therapist: More than 4 times per year    Marital Status: Married  Human resources officer Violence: Not At Risk (02/28/2021)   Humiliation, Afraid, Rape, and Kick questionnaire    Fear of Current or Ex-Partner: No    Emotionally Abused: No    Physically Abused: No    Sexually Abused: No    Family History  Problem Relation Age of Onset   Diabetes Mother    Hypertension Mother    Heart disease Father    Heart disease Brother    Colon cancer Neg Hx    Colon polyps Neg Hx     ROS: no fevers or chills, productive cough, hemoptysis, dysphasia, odynophagia, melena, hematochezia, dysuria, hematuria, rash, seizure activity, orthopnea, PND, pedal edema, claudication. Remaining systems are negative.  Physical Exam:   There were no vitals taken for this visit.  General:  Well developed/well nourished in NAD Skin warm/dry Patient not depressed No peripheral clubbing Back-normal HEENT-normal/normal eyelids Neck supple/normal carotid upstroke bilaterally; no bruits; no JVD; no thyromegaly chest - CTA/ normal expansion CV - RRR/normal S1 and S2; no murmurs, rubs or gallops;  PMI nondisplaced Abdomen -NT/ND, no HSM, no  mass, + bowel sounds, no bruit 2+ femoral pulses, no bruits Ext-no edema, chords, 2+ DP Neuro-grossly nonfocal  ECG - personally reviewed  A/P  1 hyperlipidemia-  2 hypertension-  3 family history of coronary artery disease-  Kirk Ruths, MD

## 2022-05-19 ENCOUNTER — Ambulatory Visit: Payer: Medicare Other | Attending: Cardiology | Admitting: Cardiology

## 2022-05-19 ENCOUNTER — Encounter: Payer: Self-pay | Admitting: Cardiology

## 2022-05-19 VITALS — BP 110/78 | HR 70 | Ht 65.0 in | Wt 205.2 lb

## 2022-05-19 DIAGNOSIS — E782 Mixed hyperlipidemia: Secondary | ICD-10-CM | POA: Diagnosis not present

## 2022-05-19 DIAGNOSIS — R072 Precordial pain: Secondary | ICD-10-CM | POA: Diagnosis not present

## 2022-05-19 DIAGNOSIS — R002 Palpitations: Secondary | ICD-10-CM | POA: Diagnosis present

## 2022-05-19 DIAGNOSIS — I10 Essential (primary) hypertension: Secondary | ICD-10-CM | POA: Insufficient documentation

## 2022-05-19 MED ORDER — METOPROLOL TARTRATE 100 MG PO TABS: ORAL_TABLET | ORAL | 0 refills | Status: DC

## 2022-05-19 MED ORDER — METOPROLOL SUCCINATE ER 25 MG PO TB24: 25.0000 mg | ORAL_TABLET | Freq: Every day | ORAL | 3 refills | Status: DC

## 2022-05-19 NOTE — Patient Instructions (Signed)
Medication Instructions:   START METOPROLOL SUCC ER 25 MG ONCE DAILY AT BEDTIME  *If you need a refill on your cardiac medications before your next appointment, please call your pharmacy*   Testing/Procedures:  Your physician has requested that you have an echocardiogram. Echocardiography is a painless test that uses sound waves to create images of your heart. It provides your doctor with information about the size and shape of your heart and how well your heart's chambers and valves are working. This procedure takes approximately one hour. There are no restrictions for this procedure. [place]      Your cardiac CT will be scheduled at   [facility] [place], [place] [address] [phone]  If scheduled at [facility], please arrive at the [facility] and Children's Entrance (Entrance C2) of [facility] 30 minutes prior to test start time. You can use the FREE valet parking offered at entrance C (encouraged to control the heart rate for the test)  Proceed to the [facility] Radiology Department (first floor) to check-in and test prep.  All radiology patients and guests should use entrance C2 at [facility], accessed from [facility], even though the hospital's physical address listed is [address]      Please follow these instructions carefully (unless otherwise directed):   On the Night Before the Test: Be sure to Drink plenty of water. Do not consume any caffeinated/decaffeinated beverages or chocolate 12 hours prior to your test. Do not take any antihistamines 12 hours prior to your test.   On the Day of the Test: Drink plenty of water until 1 hour prior to the test. You may take your regular medications prior to the test.  Take metoprolol (Lopressor) 100 MG two hours prior to test. FEMALES- please wear underwire-free bra if available, avoid dresses & tight clothing      After the  Test: Drink plenty of water. After receiving IV contrast, you may experience a mild flushed feeling. This is normal. On occasion, you may experience a mild rash up to 24 hours after the test. This is not dangerous. If this occurs, you can take Benadryl 25 mg and increase your fluid intake. If you experience trouble breathing, this can be serious. If it is severe call 911 IMMEDIATELY. If it is mild, please call our office.   We will call to schedule your test 2-4 weeks out understanding that some insurance companies will need an authorization prior to the service being performed.   For non-scheduling related questions, please contact the cardiac imaging nurse navigator should you have any questions/concerns: Marchia Bond, Cardiac Imaging Nurse Navigator Gordy Clement, Cardiac Imaging Nurse Navigator [facility] Direct Office Dial: [phone]   For scheduling needs, including cancellations and rescheduling, please call [place], [phone].    Follow-Up: At [facility], you and your health needs are our priority.  As part of our continuing mission to provide you with exceptional heart care, we have created designated Provider Care Teams.  These Care Teams include your primary Cardiologist (physician) and Advanced Practice Providers (APPs -  Physician Assistants and Nurse Practitioners) who all work together to provide you with the care you need, when you need it.  We recommend signing up for the patient portal called "MyChart".  Sign up information is provided on this After Visit Summary.  MyChart is used to connect with patients for Virtual Visits (Telemedicine).  Patients  are able to view lab/test results, encounter notes, upcoming appointments, etc.  Non-urgent messages can be sent to your provider as well.   To learn more about what you can do with MyChart, go to [email or web address].    Your next appointment:   6 month(s)  The format for your  next appointment:   In Person  Provider:   Kirk Ruths MD

## 2022-05-21 ENCOUNTER — Ambulatory Visit (HOSPITAL_COMMUNITY)
Admission: RE | Admit: 2022-05-21 | Discharge: 2022-05-21 | Disposition: A | Discharge status: Home / Self Care | Payer: Medicare Other | Source: Ambulatory Visit | Attending: Cardiology | Admitting: Cardiology

## 2022-05-21 DIAGNOSIS — R072 Precordial pain: Secondary | ICD-10-CM | POA: Insufficient documentation

## 2022-05-21 DIAGNOSIS — R002 Palpitations: Secondary | ICD-10-CM | POA: Insufficient documentation

## 2022-05-21 LAB — ECHOCARDIOGRAM COMPLETE
Area-P 1/2: 5.54 cm2
S' Lateral: 3 cm

## 2022-05-21 NOTE — Progress Notes (Signed)
*  PRELIMINARY RESULTS* Echocardiogram 2D Echocardiogram has been performed.  Lauren Shannon 05/21/2022, 11:17 AM

## 2022-05-25 ENCOUNTER — Encounter: Payer: Self-pay | Admitting: Cardiology

## 2022-06-05 ENCOUNTER — Ambulatory Visit (INDEPENDENT_AMBULATORY_CARE_PROVIDER_SITE_OTHER): Payer: Medicare Other

## 2022-06-05 DIAGNOSIS — Z23 Encounter for immunization: Secondary | ICD-10-CM

## 2022-06-08 ENCOUNTER — Other Ambulatory Visit: Payer: Medicare Other

## 2022-06-08 ENCOUNTER — Telehealth (HOSPITAL_COMMUNITY): Payer: Self-pay | Admitting: Emergency Medicine

## 2022-06-08 DIAGNOSIS — R072 Precordial pain: Secondary | ICD-10-CM

## 2022-06-08 NOTE — Telephone Encounter (Signed)
Reaching out to patient to offer assistance regarding upcoming cardiac imaging study; pt verbalizes understanding of appt date/time, parking situation and where to check in, pre-test NPO status and medications ordered, and verified current allergies; name and call back number provided for further questions should they arise Marchia Bond RN [place] [facility] [phone] office [phone] cell  Pt reminded to get labs. Going to her PCP to get BMP Arrival 730 , w/c entrance Denies iv issues '100mg'$  metoprolol tartrate

## 2022-06-09 ENCOUNTER — Telehealth (HOSPITAL_COMMUNITY): Payer: Self-pay | Admitting: Emergency Medicine

## 2022-06-09 LAB — BASIC METABOLIC PANEL
BUN/Creatinine Ratio: 25 (ref 12–28)
BUN: 19 mg/dL (ref 8–27)
CO2: 24 mmol/L (ref 20–29)
Calcium: 9.8 mg/dL (ref 8.7–10.3)
Chloride: 99 mmol/L (ref 96–106)
Creatinine, Ser: 0.77 mg/dL (ref 0.57–1.00)
Glucose: 93 mg/dL (ref 70–99)
Potassium: 4.5 mmol/L (ref 3.5–5.2)
Sodium: 139 mmol/L (ref 134–144)
eGFR: 86 mL/min/{1.73_m2} (ref >59)

## 2022-06-09 NOTE — Telephone Encounter (Signed)
Attempted to call patient regarding upcoming cardiac CT appointment. °Left message on voicemail with name and callback number °Deshunda Thackston RN Navigator Cardiac Imaging °[facility] °[phone] Office °[phone] Cell ° °

## 2022-06-10 ENCOUNTER — Ambulatory Visit (HOSPITAL_COMMUNITY)
Admission: RE | Admit: 2022-06-10 | Discharge: 2022-06-10 | Disposition: A | Discharge status: Home / Self Care | Payer: Medicare Other | Source: Ambulatory Visit | Attending: Cardiology | Admitting: Cardiology

## 2022-06-10 DIAGNOSIS — R072 Precordial pain: Secondary | ICD-10-CM

## 2022-06-10 MED ORDER — IOHEXOL 350 MG/ML SOLN
100.0000 mL | Freq: Once | INTRAVENOUS | Status: AC | PRN
  Administered 2022-06-10: 100 mL via INTRAVENOUS

## 2022-06-10 MED ORDER — METOPROLOL TARTRATE 5 MG/5ML IV SOLN
INTRAVENOUS | Status: AC
  Administered 2022-06-10: 10 mg via INTRAVENOUS
  Filled 2022-06-10: qty 20

## 2022-06-10 MED ORDER — DILTIAZEM HCL 25 MG/5ML IV SOLN
10.0000 mg | INTRAVENOUS | Status: DC | PRN
  Administered 2022-06-10: 10 mg via INTRAVENOUS

## 2022-06-10 MED ORDER — SODIUM CHLORIDE 0.9 % IV BOLUS
500.0000 mL | Freq: Once | INTRAVENOUS | Status: AC
  Administered 2022-06-10: 500 mL via INTRAVENOUS

## 2022-06-10 MED ORDER — DILTIAZEM HCL 25 MG/5ML IV SOLN
INTRAVENOUS | Status: AC
  Filled 2022-06-10: qty 5

## 2022-06-10 MED ORDER — NITROGLYCERIN 0.4 MG SL SUBL: 0.8000 mg | SUBLINGUAL_TABLET | Freq: Once | SUBLINGUAL | Status: AC

## 2022-06-10 MED ORDER — METOPROLOL TARTRATE 5 MG/5ML IV SOLN
10.0000 mg | INTRAVENOUS | Status: DC | PRN
  Administered 2022-06-10: 10 mg via INTRAVENOUS

## 2022-06-10 MED ORDER — NITROGLYCERIN 0.4 MG SL SUBL
SUBLINGUAL_TABLET | SUBLINGUAL | Status: AC
  Administered 2022-06-10: 0.8 mg via SUBLINGUAL
  Filled 2022-06-10: qty 2

## 2022-06-10 MED ORDER — DILTIAZEM HCL 25 MG/5ML IV SOLN
INTRAVENOUS | Status: AC
  Administered 2022-06-10: 10 mg via INTRAVENOUS
  Filled 2022-06-10: qty 5

## 2022-06-24 ENCOUNTER — Ambulatory Visit (HOSPITAL_COMMUNITY)
Admission: RE | Admit: 2022-06-24 | Discharge: 2022-06-24 | Disposition: A | Discharge status: Home / Self Care | Payer: Medicare Other | Source: Ambulatory Visit | Attending: Obstetrics & Gynecology | Admitting: Obstetrics & Gynecology

## 2022-06-24 DIAGNOSIS — Z1231 Encounter for screening mammogram for malignant neoplasm of breast: Secondary | ICD-10-CM | POA: Insufficient documentation

## 2022-06-30 ENCOUNTER — Other Ambulatory Visit: Payer: Self-pay | Admitting: Family Medicine

## 2022-06-30 DIAGNOSIS — E039 Hypothyroidism, unspecified: Secondary | ICD-10-CM

## 2022-07-24 ENCOUNTER — Encounter: Payer: Self-pay | Admitting: Family Medicine

## 2022-07-24 DIAGNOSIS — E782 Mixed hyperlipidemia: Secondary | ICD-10-CM

## 2022-07-24 DIAGNOSIS — E039 Hypothyroidism, unspecified: Secondary | ICD-10-CM

## 2022-07-24 DIAGNOSIS — I10 Essential (primary) hypertension: Secondary | ICD-10-CM

## 2022-07-24 DIAGNOSIS — E119 Type 2 diabetes mellitus without complications: Secondary | ICD-10-CM

## 2022-07-25 ENCOUNTER — Other Ambulatory Visit: Payer: Self-pay

## 2022-07-31 ENCOUNTER — Other Ambulatory Visit: Payer: Medicare Other

## 2022-07-31 DIAGNOSIS — E119 Type 2 diabetes mellitus without complications: Secondary | ICD-10-CM

## 2022-07-31 DIAGNOSIS — I10 Essential (primary) hypertension: Secondary | ICD-10-CM

## 2022-07-31 DIAGNOSIS — E039 Hypothyroidism, unspecified: Secondary | ICD-10-CM

## 2022-07-31 DIAGNOSIS — E782 Mixed hyperlipidemia: Secondary | ICD-10-CM

## 2022-07-31 LAB — BAYER DCA HB A1C WAIVED: HB A1C (BAYER DCA - WAIVED): 5.4 % (ref 4.8–5.6)

## 2022-08-01 LAB — CMP14+EGFR
ALT: 22 IU/L (ref 0–32)
AST: 21 IU/L (ref 0–40)
Albumin/Globulin Ratio: 1.8 (ref 1.2–2.2)
Albumin: 4.2 g/dL (ref 3.9–4.9)
Alkaline Phosphatase: 57 IU/L (ref 44–121)
BUN/Creatinine Ratio: 24 (ref 12–28)
BUN: 16 mg/dL (ref 8–27)
Bilirubin Total: 0.8 mg/dL (ref 0.0–1.2)
CO2: 25 mmol/L (ref 20–29)
Calcium: 9.1 mg/dL (ref 8.7–10.3)
Chloride: 99 mmol/L (ref 96–106)
Creatinine, Ser: 0.67 mg/dL (ref 0.57–1.00)
Globulin, Total: 2.3 g/dL (ref 1.5–4.5)
Glucose: 90 mg/dL (ref 70–99)
Potassium: 4.5 mmol/L (ref 3.5–5.2)
Sodium: 137 mmol/L (ref 134–144)
Total Protein: 6.5 g/dL (ref 6.0–8.5)
eGFR: 97 mL/min/{1.73_m2} (ref >59)

## 2022-08-01 LAB — CBC WITH DIFFERENTIAL/PLATELET
Basophils Absolute: 0 10*3/uL (ref 0.0–0.2)
Basos: 1 %
EOS (ABSOLUTE): 0.1 10*3/uL (ref 0.0–0.4)
Eos: 3 %
Hematocrit: 42.9 % (ref 34.0–46.6)
Hemoglobin: 14.2 g/dL (ref 11.1–15.9)
Immature Grans (Abs): 0 10*3/uL (ref 0.0–0.1)
Immature Granulocytes: 0 %
Lymphocytes Absolute: 1.3 10*3/uL (ref 0.7–3.1)
Lymphs: 25 %
MCH: 30.9 pg (ref 26.6–33.0)
MCHC: 33.1 g/dL (ref 31.5–35.7)
MCV: 94 fL (ref 79–97)
Monocytes Absolute: 0.5 10*3/uL (ref 0.1–0.9)
Monocytes: 9 %
Neutrophils Absolute: 3.1 10*3/uL (ref 1.4–7.0)
Neutrophils: 62 %
Platelets: 249 10*3/uL (ref 150–450)
RBC: 4.59 x10E6/uL (ref 3.77–5.28)
RDW: 11.4 % — ABNORMAL LOW (ref 11.7–15.4)
WBC: 4.9 10*3/uL (ref 3.4–10.8)

## 2022-08-01 LAB — THYROID PANEL WITH TSH
Free Thyroxine Index: 1.9 (ref 1.2–4.9)
T3 Uptake Ratio: 27 % (ref 24–39)
T4, Total: 7 ug/dL (ref 4.5–12.0)
TSH: 1.69 u[IU]/mL (ref 0.450–4.500)

## 2022-08-01 LAB — LIPID PANEL
Chol/HDL Ratio: 2.7 ratio (ref 0.0–4.4)
Cholesterol, Total: 157 mg/dL (ref 100–199)
HDL: 59 mg/dL (ref >39)
LDL Chol Calc (NIH): 79 mg/dL (ref 0–99)
Triglycerides: 108 mg/dL (ref 0–149)
VLDL Cholesterol Cal: 19 mg/dL (ref 5–40)

## 2022-08-04 ENCOUNTER — Ambulatory Visit (INDEPENDENT_AMBULATORY_CARE_PROVIDER_SITE_OTHER): Payer: Medicare Other | Admitting: Family Medicine

## 2022-08-04 ENCOUNTER — Encounter: Payer: Self-pay | Admitting: Family Medicine

## 2022-08-04 VITALS — BP 118/76 | HR 76 | Temp 97.7°F | Ht 65.0 in | Wt 205.2 lb

## 2022-08-04 DIAGNOSIS — E782 Mixed hyperlipidemia: Secondary | ICD-10-CM | POA: Diagnosis not present

## 2022-08-04 DIAGNOSIS — M810 Age-related osteoporosis without current pathological fracture: Secondary | ICD-10-CM

## 2022-08-04 DIAGNOSIS — Z23 Encounter for immunization: Secondary | ICD-10-CM

## 2022-08-04 DIAGNOSIS — E039 Hypothyroidism, unspecified: Secondary | ICD-10-CM

## 2022-08-04 DIAGNOSIS — I10 Essential (primary) hypertension: Secondary | ICD-10-CM

## 2022-08-04 DIAGNOSIS — E119 Type 2 diabetes mellitus without complications: Secondary | ICD-10-CM

## 2022-08-04 MED ORDER — ALENDRONATE SODIUM 70 MG PO TABS: ORAL_TABLET | ORAL | 3 refills | Status: AC

## 2022-08-04 MED ORDER — ATORVASTATIN CALCIUM 40 MG PO TABS: 40.0000 mg | ORAL_TABLET | Freq: Every day | ORAL | 3 refills | Status: AC

## 2022-08-04 MED ORDER — LEVOTHYROXINE SODIUM 50 MCG PO TABS: 50.0000 ug | ORAL_TABLET | Freq: Every day | ORAL | 1 refills | Status: AC

## 2022-08-04 NOTE — Telephone Encounter (Signed)
Please call pt to schedule apt.

## 2022-08-04 NOTE — Addendum Note (Signed)
Addended by: Baldomero Lamy B on: 08/04/2022 09:06 AM   Modules accepted: Orders

## 2022-08-04 NOTE — Progress Notes (Signed)
Subjective:  Patient ID: Lauren Shannon, female    DOB: 07/06/1956  Age: 66 y.o. MRN: [id number]  CC: Medical Management of Chronic Issues   HPI Lauren Shannon presents for  follow-up on  thyroid. The patient has a history of hypothyroidism for many years. It has been stable recently. Pt. denies any change in  voice, loss of hair, heat or cold intolerance. Energy level has been adequate to good. Patient denies constipation and diarrhea. No myxedema. Medication is as noted below. Verified that pt is taking it daily on an empty stomach. Well tolerated.  in for follow-up of elevated cholesterol. Doing well without complaints on current medication. Denies side effects of statin including myalgia and arthralgia and nausea. Currently no chest pain, shortness of breath or other cardiovascular related symptoms noted.  presents for  follow-up of hypertension. Patient has no history of headache chest pain or shortness of breath or recent cough. Patient also denies symptoms of TIA such as focal numbness or weakness. Patient denies side effects from medication. States taking it regularly.      08/04/2022    8:16 AM 08/04/2022    8:08 AM 03/05/2022    8:02 AM  Depression screen PHQ 2/9  Decreased Interest 0 0 0  Down, Depressed, Hopeless 0 0 0  PHQ - 2 Score 0 0 0  Altered sleeping 1  1  Tired, decreased energy 1  1  Change in appetite 2  0  Feeling bad or failure about yourself  0  0  Trouble concentrating 1  0  Moving slowly or fidgety/restless 0  0  Suicidal thoughts 0  0  PHQ-9 Score 5  2  Difficult doing work/chores Not difficult at all  Not difficult at all    History Lauren Shannon has a past medical history of Allergy, Arthritis, Basal cell carcinoma, COVID-19 (08/2020), Headache, History of pneumonia (1963), Low back pain, Mixed hyperlipidemia, Routine gynecological examination (08/22/2013), and Type 2 diabetes mellitus ([place]).   She has a past surgical history that includes Thyroid surgery  (09/01/2003); Uterine polyp removal; Tubal ligation (09/01/1991); Cholecystectomy; Appendectomy; Tonsillectomy; Plantar fascia surgery (Right, 08/31/2010); stimulation system implant (08/31/2013); Colonoscopy (N/A, 01/28/2018); and LEFT TOTAL ARTHROPLASTY (Left, 07/22/2021).   Her family history includes Diabetes in her mother; Heart disease in her brother and father; Hypertension in her mother.She reports that she has never smoked. She has never used smokeless tobacco. She reports that she does not currently use alcohol. She reports that she does not use drugs.    ROS Review of Systems  Constitutional: Negative.   HENT:  Positive for sore throat (going away afater 2 weeks).   Eyes:  Negative for visual disturbance.  Respiratory:  Negative for shortness of breath.   Cardiovascular:  Negative for chest pain.  Gastrointestinal:  Negative for abdominal pain.  Musculoskeletal:  Negative for arthralgias.    Objective:  BP 118/76   Pulse 76   Temp 97.7 F (36.5 C)   Ht _0  (1.651 m)   Wt 205 lb 3.2 oz (93.1 kg)   SpO2 98%   BMI 34.15 kg/m   BP Readings from Last 3 Encounters:  08/04/22 118/76  06/10/22 107/62  05/19/22 110/78    Wt Readings from Last 3 Encounters:  08/04/22 205 lb 3.2 oz (93.1 kg)  05/19/22 205 lb 3.2 oz (93.1 kg)  03/05/22 200 lb (90.7 kg)     Physical Exam Constitutional:      General: She is not in acute distress.  Appearance: She is well-developed.  Cardiovascular:     Rate and Rhythm: Normal rate and regular rhythm.  Pulmonary:     Breath sounds: Normal breath sounds.  Musculoskeletal:        General: Normal range of motion.  Skin:    General: Skin is warm and dry.  Neurological:     Mental Status: She is alert and oriented to person, place, and time.       Assessment & Plan:   Lauren Shannon was seen today for medical management of chronic issues.  Diagnoses and all orders for this visit:  Hypothyroidism, unspecified type -      levothyroxine (SYNTHROID) 50 MCG tablet; Take 1 tablet (50 mcg total) by mouth daily.  Mixed hyperlipidemia -     atorvastatin (LIPITOR) 40 MG tablet; Take 1 tablet (40 mg total) by mouth daily.  Essential hypertension  Osteoporosis, unspecified osteoporosis type, unspecified pathological fracture presence -     alendronate (FOSAMAX) 70 MG tablet; TAKE 1 TABLET ONCE A WEEK ON AN EMPTY STOMACH  Type 2 diabetes mellitus without complication, without long-term current use of insulin (HCC) -     atorvastatin (LIPITOR) 40 MG tablet; Take 1 tablet (40 mg total) by mouth daily.       I have changed Lauren Shannon's levothyroxine. I am also having her maintain her multivitamin, aspirin, Calcium Citrate-Vitamin D (CALCIUM + D PO), blood glucose meter kit and supplies, cyanocobalamin, mirabegron ER, Melatonin, Trulicity, cetirizine, gabapentin, metoprolol succinate, metoprolol tartrate, alendronate, and atorvastatin.  Allergies as of 08/04/2022       Reactions   Oxybutynin Shortness Of Breath, Palpitations        Medication List        Accurate as of August 04, 2022  8:54 AM. If you have any questions, ask your nurse or doctor.          alendronate 70 MG tablet Commonly known as: FOSAMAX TAKE 1 TABLET ONCE A WEEK ON AN EMPTY STOMACH   aspirin 81 MG tablet Take 81 mg by mouth every evening.   atorvastatin 40 MG tablet Commonly known as: LIPITOR Take 1 tablet (40 mg total) by mouth daily.   blood glucose meter kit and supplies Kit Dispense based on patient and insurance preference. Use up to four times daily as directed. (FOR ICD-10 : E11.9   CALCIUM + D PO Take 1 tablet by mouth daily.   cetirizine 10 MG tablet Commonly known as: ZYRTEC TAKE ONE TABLET ONCE DAILY   cyanocobalamin 1000 MCG tablet Commonly known as: VITAMIN B12 Take 2,000 mcg by mouth daily.   gabapentin 100 MG capsule Commonly known as: NEURONTIN Take 100 mg by mouth daily.   levothyroxine 50 MCG  tablet Commonly known as: SYNTHROID Take 1 tablet (50 mcg total) by mouth daily.   Melatonin 10 MG Subl Place under the tongue at bedtime.   metoprolol succinate 25 MG 24 hr tablet Commonly known as: Toprol XL Take 1 tablet (25 mg total) by mouth at bedtime.   metoprolol tartrate 100 MG tablet Commonly known as: LOPRESSOR TAKE 2 HOURS PRIOR TO CT SCAN   mirabegron ER 50 MG Tb24 tablet Commonly known as: Myrbetriq Take 1 tablet (50 mg total) by mouth daily.   multivitamin capsule Take 1 capsule by mouth daily.   Trulicity 1.5 ZO/1.0RU Sopn Generic drug: Dulaglutide Inject content of one pen under the skin weekly         Follow-up: Return in about 6 months (around 02/03/2023)  for hypertension, Hypothyroidism, cholesterol.  [name], M.D.

## 2022-08-05 ENCOUNTER — Other Ambulatory Visit: Payer: Self-pay

## 2022-08-05 DIAGNOSIS — Z780 Asymptomatic menopausal state: Secondary | ICD-10-CM

## 2022-08-05 NOTE — Telephone Encounter (Signed)
Called patient and made appt for 08/11/22

## 2022-08-11 ENCOUNTER — Ambulatory Visit (INDEPENDENT_AMBULATORY_CARE_PROVIDER_SITE_OTHER): Payer: Medicare Other

## 2022-08-11 DIAGNOSIS — Z780 Asymptomatic menopausal state: Secondary | ICD-10-CM | POA: Diagnosis not present

## 2022-09-15 ENCOUNTER — Ambulatory Visit (INDEPENDENT_AMBULATORY_CARE_PROVIDER_SITE_OTHER): Payer: Medicare Other | Admitting: Family Medicine

## 2022-09-15 ENCOUNTER — Encounter: Payer: Self-pay | Admitting: Family Medicine

## 2022-09-15 VITALS — BP 106/66 | HR 80 | Temp 97.7°F | Ht 65.0 in | Wt 202.6 lb

## 2022-09-15 DIAGNOSIS — G500 Trigeminal neuralgia: Secondary | ICD-10-CM | POA: Diagnosis not present

## 2022-09-15 MED ORDER — GABAPENTIN 300 MG PO CAPS: ORAL_CAPSULE | ORAL | 0 refills | Status: DC

## 2022-09-15 NOTE — Progress Notes (Signed)
Subjective:  Patient ID: Lauren Shannon, female    DOB: 1956/02/18  Age: 67 y.o. MRN: [id number]  CC: NECK AND FACIAL DISCOMFORT (LEFT SIDE)   HPI Lauren Shannon presents for pain left lower half of face. Hurts to eat or drink too. Worst is with that, but there all the time. 3/10 baseline. With food 7/10. Hit 10/10 at breakfast 2 days ago. Shooting pain, sharp. Saw dentist & that ruled out. Onset was 2 months ago. Getting worse. No relief with compresses. Tylenol, ibuprofen maybe a little bit of relief.     09/15/2022    9:31 AM 08/04/2022    8:16 AM 08/04/2022    8:08 AM  Depression screen PHQ 2/9  Decreased Interest 0 0 0  Down, Depressed, Hopeless 0 0 0  PHQ - 2 Score 0 0 0  Altered sleeping 2 1   Tired, decreased energy 1 1   Change in appetite 1 2   Feeling bad or failure about yourself  0 0   Trouble concentrating 0 1   Moving slowly or fidgety/restless 0 0   Suicidal thoughts 0 0   PHQ-9 Score 4 5   Difficult doing work/chores Not difficult at all Not difficult at all     History Lauren Shannon has a past medical history of Allergy, Arthritis, Basal cell carcinoma, COVID-19 (08/2020), Headache, History of pneumonia (1963), Low back pain, Mixed hyperlipidemia, Routine gynecological examination (08/22/2013), and Type 2 diabetes mellitus ([place]).   She has a past surgical history that includes Thyroid surgery (09/01/2003); Uterine polyp removal; Tubal ligation (09/01/1991); Cholecystectomy; Appendectomy; Tonsillectomy; Plantar fascia surgery (Right, 08/31/2010); stimulation system implant (08/31/2013); Colonoscopy (N/A, 01/28/2018); and LEFT TOTAL ARTHROPLASTY (Left, 07/22/2021).   Her family history includes Diabetes in her mother; Heart disease in her brother and father; Hypertension in her mother.She reports that she has never smoked. She has never used smokeless tobacco. She reports that she does not currently use alcohol. She reports that she does not use drugs.    ROS Review of  Systems  Constitutional: Negative.   HENT: Negative.    Eyes:  Negative for visual disturbance.  Respiratory:  Negative for shortness of breath.   Cardiovascular:  Negative for chest pain.  Gastrointestinal:  Negative for abdominal pain.  Musculoskeletal:  Negative for arthralgias.    Objective:  BP 106/66   Pulse 80   Temp 97.7 F (36.5 C)   Ht '5\' 5"'$  (1.651 m)   Wt 202 lb 9.6 oz (91.9 kg)   SpO2 98%   BMI 33.71 kg/m   BP Readings from Last 3 Encounters:  09/15/22 106/66  08/04/22 118/76  06/10/22 107/62    Wt Readings from Last 3 Encounters:  09/15/22 202 lb 9.6 oz (91.9 kg)  08/04/22 205 lb 3.2 oz (93.1 kg)  05/19/22 205 lb 3.2 oz (93.1 kg)     Physical Exam Constitutional:      General: She is not in acute distress.    Appearance: She is well-developed.  HENT:     Head: Normocephalic and atraumatic.  Eyes:     Conjunctiva/sclera: Conjunctivae normal.     Pupils: Pupils are equal, round, and reactive to light.  Neck:     Thyroid: No thyromegaly.  Cardiovascular:     Rate and Rhythm: Normal rate and regular rhythm.     Heart sounds: Normal heart sounds. No murmur heard. Pulmonary:     Effort: Pulmonary effort is normal. No respiratory distress.     Breath sounds: Normal  breath sounds. No wheezing or rales.  Abdominal:     General: Bowel sounds are normal. There is no distension.     Palpations: Abdomen is soft.     Tenderness: There is no abdominal tenderness.  Musculoskeletal:        General: Normal range of motion.     Cervical back: Normal range of motion and neck supple.  Lymphadenopathy:     Cervical: No cervical adenopathy.  Skin:    General: Skin is warm and dry.     Findings: No lesion.  Neurological:     Mental Status: She is alert and oriented to person, place, and time.     Comments: Tenderness at left trigeminal third branch   Psychiatric:        Behavior: Behavior normal.        Thought Content: Thought content normal.         Judgment: Judgment normal.       Assessment & Plan:   Lauren Shannon was seen today for neck and facial discomfort.  Diagnoses and all orders for this visit:  Trigeminal neuralgia of left side of face  Other orders -     gabapentin (NEURONTIN) 300 MG capsule; 1 at bedtime for1 week then 2  The next  week then 3 the next week then 4 daily.       I have discontinued [place]'s gabapentin. I am also having her start on gabapentin. Additionally, I am having her maintain her multivitamin, aspirin, Calcium Citrate-Vitamin D (CALCIUM + D PO), blood glucose meter kit and supplies, cyanocobalamin, mirabegron ER, Melatonin, Trulicity, cetirizine, metoprolol succinate, metoprolol tartrate, alendronate, atorvastatin, and levothyroxine.  Allergies as of 09/15/2022       Reactions   Oxybutynin Shortness Of Breath, Palpitations        Medication List        Accurate as of September 15, 2022  9:23 PM. If you have any questions, ask your nurse or doctor.          alendronate 70 MG tablet Commonly known as: FOSAMAX TAKE 1 TABLET ONCE A WEEK ON AN EMPTY STOMACH   aspirin 81 MG tablet Take 81 mg by mouth every evening.   atorvastatin 40 MG tablet Commonly known as: LIPITOR Take 1 tablet (40 mg total) by mouth daily.   blood glucose meter kit and supplies Kit Dispense based on patient and insurance preference. Use up to four times daily as directed. (FOR ICD-10 : E11.9   CALCIUM + D PO Take 1 tablet by mouth daily.   cetirizine 10 MG tablet Commonly known as: ZYRTEC TAKE ONE TABLET ONCE DAILY   cyanocobalamin 1000 MCG tablet Commonly known as: VITAMIN B12 Take 2,000 mcg by mouth daily.   gabapentin 300 MG capsule Commonly known as: NEURONTIN 1 at bedtime for1 week then 2  The next  week then 3 the next week then 4 daily. What changed:  medication strength how much to take how to take this when to take this additional instructions Changed by: [name], MD    levothyroxine 50 MCG tablet Commonly known as: SYNTHROID Take 1 tablet (50 mcg total) by mouth daily.   Melatonin 10 MG Subl Place under the tongue at bedtime.   metoprolol succinate 25 MG 24 hr tablet Commonly known as: Toprol XL Take 1 tablet (25 mg total) by mouth at bedtime.   metoprolol tartrate 100 MG tablet Commonly known as: LOPRESSOR TAKE 2 HOURS PRIOR TO CT SCAN   mirabegron  ER 50 MG Tb24 tablet Commonly known as: Myrbetriq Take 1 tablet (50 mg total) by mouth daily.   multivitamin capsule Take 1 capsule by mouth daily.   Trulicity 1.5 OM/3.5DH Sopn Generic drug: Dulaglutide Inject content of one pen under the skin weekly         Follow-up: Return in about 2 months (around 11/14/2022) for trigeminal neuralgia.  [name], M.D.

## 2022-09-24 ENCOUNTER — Telehealth: Payer: Self-pay

## 2022-09-24 NOTE — Telephone Encounter (Signed)
Samyuktha Schnick ([facility] [place]) PA Case ID #: [id number] Rx #: [id number] Need Help? Call us at [phone] Status sent iconSent to Plan today Drug Trulicity 1.'5MG'$ /0.5ML pen-injectors ePA cloud logo Form [facility] Medicare Electronic Prior Authorization Request Form [phone] NCPDP)

## 2022-10-01 ENCOUNTER — Other Ambulatory Visit (HOSPITAL_COMMUNITY): Payer: Self-pay

## 2022-10-01 NOTE — Telephone Encounter (Signed)
Patient Advocate Encounter  Prior Authorization for Trulicity 1.'5MG'$ /0.5ML pen-injectors has been approved.    PA# [id number] Effective dates: 09/10/22 through until further notice  Pharmacy aware

## 2022-10-05 ENCOUNTER — Ambulatory Visit (INDEPENDENT_AMBULATORY_CARE_PROVIDER_SITE_OTHER): Payer: Medicare Other | Admitting: Family

## 2022-10-05 ENCOUNTER — Encounter: Payer: Self-pay | Admitting: Family

## 2022-10-05 VITALS — BP 123/74 | HR 70 | Temp 97.4°F | Ht 65.0 in | Wt 205.2 lb

## 2022-10-05 DIAGNOSIS — J209 Acute bronchitis, unspecified: Secondary | ICD-10-CM

## 2022-10-05 MED ORDER — PREDNISONE 20 MG PO TABS: 40.0000 mg | ORAL_TABLET | Freq: Every day | ORAL | 0 refills | Status: AC

## 2022-10-05 MED ORDER — PROMETHAZINE-DM 6.25-15 MG/5ML PO SYRP: 5.0000 mL | ORAL_SOLUTION | Freq: Three times a day (TID) | ORAL | 0 refills | Status: DC | PRN

## 2022-10-05 NOTE — Progress Notes (Signed)
Subjective:    Patient ID: Lauren Shannon, female    DOB: December 26, 1955, 67 y.o.   MRN: [id number]  Chief Complaint  Patient presents with   Cough    Dry scratchy throat from drainage. Denies fevers. Been going on 5 days. Tsp of honey and tea     Cough This is a new problem. The current episode started in the past 7 days. The problem has been gradually worsening. The problem occurs every few minutes. The cough is Productive of sputum. Associated symptoms include headaches, nasal congestion and a sore throat. Pertinent negatives include no chills, ear congestion, ear pain, fever, myalgias, shortness of breath or wheezing. She has tried rest and OTC cough suppressant for the symptoms. The treatment provided mild relief.      Review of Systems  Constitutional:  Negative for chills and fever.  HENT:  Positive for sore throat. Negative for ear pain.   Respiratory:  Positive for cough. Negative for shortness of breath and wheezing.   Musculoskeletal:  Negative for myalgias.  Neurological:  Positive for headaches.  All other systems reviewed and are negative.      Objective:   Physical Exam Vitals reviewed.  Constitutional:      General: She is not in acute distress.    Appearance: She is well-developed. She is obese.  HENT:     Head: Normocephalic and atraumatic.     Right Ear: Tympanic membrane normal.     Left Ear: Tympanic membrane normal.  Eyes:     Pupils: Pupils are equal, round, and reactive to light.  Neck:     Thyroid: No thyromegaly.  Cardiovascular:     Rate and Rhythm: Normal rate and regular rhythm.     Heart sounds: Normal heart sounds. No murmur heard. Pulmonary:     Effort: Pulmonary effort is normal. No respiratory distress.     Breath sounds: Normal breath sounds. No wheezing.     Comments: Dry nonproductive  Abdominal:     General: Bowel sounds are normal. There is no distension.     Palpations: Abdomen is soft.     Tenderness: There is no abdominal  tenderness.  Musculoskeletal:        General: No tenderness. Normal range of motion.     Cervical back: Normal range of motion and neck supple.  Skin:    General: Skin is warm and dry.  Neurological:     Mental Status: She is alert and oriented to person, place, and time.     Cranial Nerves: No cranial nerve deficit.     Deep Tendon Reflexes: Reflexes are normal and symmetric.  Psychiatric:        Behavior: Behavior normal.        Thought Content: Thought content normal.        Judgment: Judgment normal.    BP 123/74   Pulse 70   Temp (!) 97.4 F (36.3 C) (Temporal)   Ht '5\' 5"'$  (1.651 m)   Wt 205 lb 3.2 oz (93.1 kg)   SpO2 99%   BMI 34.15 kg/m       Assessment & Plan:  Lauren Shannon comes in today with chief complaint of Cough (Dry scratchy throat from drainage. Denies fevers. Been going on 5 days. Tsp of honey and tea )   Diagnosis and orders addressed:  1. Acute bronchitis, unspecified organism - Take meds as prescribed - Use a cool mist humidifier  -Use saline nose sprays frequently -Force fluids -For any cough  or congestion  Use plain Mucinex- regular strength or max strength is fine -For fever or aces or pains- take tylenol or ibuprofen. -Throat lozenges if help -Follow up if symptoms worsen or do not improve  - predniSONE (DELTASONE) 20 MG tablet; Take 2 tablets (40 mg total) by mouth daily with breakfast for 5 days.  Dispense: 10 tablet; Refill: 0 - promethazine-dextromethorphan (PROMETHAZINE-DM) 6.25-15 MG/5ML syrup; Take 5 mLs by mouth 3 (three) times daily as needed for cough.  Dispense: 118 mL; Refill: 0   Evelina Dun, FNP

## 2022-10-05 NOTE — Patient Instructions (Signed)

## 2022-10-17 ENCOUNTER — Other Ambulatory Visit: Payer: Self-pay | Admitting: Family Medicine

## 2022-10-23 ENCOUNTER — Encounter: Payer: Self-pay | Admitting: Family Medicine

## 2022-11-10 NOTE — Progress Notes (Signed)
HPI: Follow-up chest pain and palpitations. Echocardiogram September 2023 showed ejection fraction 60 to 65%.  Coronary CTA October 2023 showed calcium score 0 with no coronary disease.  Since last seen her palpitations are much improved.  She denies dyspnea, chest pain or syncope.  Current Outpatient Medications  Medication Sig Dispense Refill   alendronate (FOSAMAX) 70 MG tablet TAKE 1 TABLET ONCE A WEEK ON AN EMPTY STOMACH 13 tablet 3   aspirin 81 MG tablet Take 81 mg by mouth every evening.      atorvastatin (LIPITOR) 40 MG tablet Take 1 tablet (40 mg total) by mouth daily. 90 tablet 3   blood glucose meter kit and supplies KIT Dispense based on patient and insurance preference. Use up to four times daily as directed. (FOR ICD-10 : E11.9 1 each 0   Calcium Citrate-Vitamin D (CALCIUM + D PO) Take 1 tablet by mouth daily.     Dulaglutide (TRULICITY) 1.5 0000000 SOPN Inject content of one pen under the skin weekly 2 mL 12   gabapentin (NEURONTIN) 300 MG capsule Take 2 capsules with lunch and four with supper daily 180 capsule 0   levothyroxine (SYNTHROID) 50 MCG tablet Take 1 tablet (50 mcg total) by mouth daily. 90 tablet 1   Melatonin 10 MG SUBL Place under the tongue at bedtime.     metoprolol succinate (TOPROL XL) 25 MG 24 hr tablet Take 1 tablet (25 mg total) by mouth at bedtime. 90 tablet 3   mirabegron ER (MYRBETRIQ) 50 MG TB24 tablet Take 1 tablet (50 mg total) by mouth daily. 90 tablet 1   Multiple Vitamin (MULTIVITAMIN) capsule Take 1 capsule by mouth daily.     promethazine-dextromethorphan (PROMETHAZINE-DM) 6.25-15 MG/5ML syrup Take 5 mLs by mouth 3 (three) times daily as needed for cough. 118 mL 0   traMADol (ULTRAM) 50 MG tablet Take 50 mg by mouth every 6 (six) hours as needed for severe pain.     No current facility-administered medications for this visit.     Past Medical History:  Diagnosis Date   Allergy    Arthritis    ostoarthritis   Basal cell carcinoma     COVID-19 08/2020   Headache    History of pneumonia 1963   Low back pain    Mixed hyperlipidemia    Routine gynecological examination 08/22/2013   Type 2 diabetes mellitus ([place])    pt stated not currently being treated for this    Past Surgical History:  Procedure Laterality Date   APPENDECTOMY     CHOLECYSTECTOMY     COLONOSCOPY N/A 01/28/2018   Procedure: COLONOSCOPY;  Surgeon: Danie Binder, MD;  Location: [facility];  Service: Endoscopy;  Laterality: N/A;  1:00   LEFT TOTAL ARTHROPLASTY Left 07/22/2021   PLANTAR FASCIA SURGERY Right 08/31/2010   stimulation system implant  08/31/2013   THYROID SURGERY  09/01/2003   TONSILLECTOMY     TUBAL LIGATION  09/01/1991   Uterine polyp removal      Social History   Socioeconomic History   Marital status: Married    Spouse name: JIM   Number of children: 2   Years of education: Not on file   Highest education level: Master's degree (e.g., MA, MS, MEng, MEd, MSW, MBA)  Occupational History   Not on file  Tobacco Use   Smoking status: Never   Smokeless tobacco: Never  Vaping Use   Vaping Use: Never used  Substance and Sexual Activity  Alcohol use: Not Currently   Drug use: No   Sexual activity: Not Currently    Partners: Male  Other Topics Concern   Not on file  Social History Narrative   Not on file   Social Determinants of Health   Financial Resource Strain: Low Risk  (02/28/2021)   Overall Financial Resource Strain (CARDIA)    Difficulty of Paying Living Expenses: Not very hard  Food Insecurity: No Food Insecurity (02/28/2021)   Hunger Vital Sign    Worried About Running Out of Food in the Last Year: Never true    Ran Out of Food in the Last Year: Never true  Transportation Needs: No Transportation Needs (02/28/2021)   PRAPARE - Hydrologist (Medical): No    Lack of Transportation (Non-Medical): No  Physical Activity: Inactive (02/28/2021)   Exercise Vital Sign    Days of Exercise  per Week: 0 days    Minutes of Exercise per Session: 0 min  Stress: Stress Concern Present (02/28/2021)   [place]    Feeling of Stress : To some extent  Social Connections: Moderately Integrated (02/28/2021)   Social Connection and Isolation Panel [NHANES]    Frequency of Communication with Friends and Family: Once a week    Frequency of Social Gatherings with Friends and Family: Once a week    Attends Religious Services: More than 4 times per year    Active Member of [employer] or Organizations: Yes    Attends Music therapist: More than 4 times per year    Marital Status: Married  Human resources officer Violence: Not At Risk (02/28/2021)   Humiliation, Afraid, Rape, and Kick questionnaire    Fear of Current or Ex-Partner: No    Emotionally Abused: No    Physically Abused: No    Sexually Abused: No    Family History  Problem Relation Age of Onset   Diabetes Mother    Hypertension Mother    Heart disease Father    Heart disease Brother    Colon cancer Neg Hx    Colon polyps Neg Hx     ROS: no fevers or chills, productive cough, hemoptysis, dysphasia, odynophagia, melena, hematochezia, dysuria, hematuria, rash, seizure activity, orthopnea, PND, pedal edema, claudication. Remaining systems are negative.  Physical Exam: Well-developed well-nourished in no acute distress.  Skin is warm and dry.  HEENT is normal.  Neck is supple.  Chest is clear to auscultation with normal expansion.  Cardiovascular exam is regular rate and rhythm.  Abdominal exam nontender or distended. No masses palpated. Extremities show no edema. neuro grossly intact   A/P  1 chest pain-patient has had no further symptoms.  Previous cardiac CTA showed no coronary disease.  Will discontinue aspirin.  2 hypertension-blood pressure controlled.  Continue present medical regimen.  3 hyperlipidemia-continue statin.  4 history of  palpitations-continue Toprol at present dose.  Symptoms are improved compared to last office visit.  Kirk Ruths, MD

## 2022-11-12 ENCOUNTER — Encounter: Payer: Self-pay | Admitting: Family Medicine

## 2022-11-12 ENCOUNTER — Ambulatory Visit (INDEPENDENT_AMBULATORY_CARE_PROVIDER_SITE_OTHER): Payer: Medicare Other | Admitting: Family Medicine

## 2022-11-12 VITALS — BP 110/67 | HR 80 | Temp 97.6°F | Ht 65.0 in | Wt 206.2 lb

## 2022-11-12 DIAGNOSIS — G500 Trigeminal neuralgia: Secondary | ICD-10-CM

## 2022-11-12 DIAGNOSIS — I10 Essential (primary) hypertension: Secondary | ICD-10-CM

## 2022-11-12 MED ORDER — GABAPENTIN 300 MG PO CAPS: ORAL_CAPSULE | ORAL | 0 refills | Status: DC

## 2022-11-12 NOTE — Progress Notes (Signed)
Subjective:  Patient ID: Lauren Shannon, female    DOB: November 11, 1955  Age: 67 y.o. MRN: [id number]  CC: Medical Management of Chronic Issues   HPI Lauren Shannon presents for trigeminal neuralgia. Pain is electrical, sharp. Starts in the afternoon as the previous days dose wears off.  It is a 0/10 for 65% of the day, but quickly goes to 9-10 in the afternoon. Will get 3-4 jolts in an afternoon     11/12/2022    8:28 AM 11/12/2022    8:19 AM 09/15/2022    9:31 AM  Depression screen PHQ 2/9  Decreased Interest 3 0 0  Down, Depressed, Hopeless 0 0 0  PHQ - 2 Score 3 0 0  Altered sleeping 1  2  Tired, decreased energy 1  1  Change in appetite 0  1  Feeling bad or failure about yourself  0  0  Trouble concentrating 0  0  Moving slowly or fidgety/restless 0  0  Suicidal thoughts 0  0  PHQ-9 Score 5  4  Difficult doing work/chores Extremely dIfficult  Not difficult at all    History Lauren Shannon has a past medical history of Allergy, Arthritis, Basal cell carcinoma, COVID-19 (08/2020), Headache, History of pneumonia (1963), Low back pain, Mixed hyperlipidemia, Routine gynecological examination (08/22/2013), and Type 2 diabetes mellitus ([place]).   She has a past surgical history that includes Thyroid surgery (09/01/2003); Uterine polyp removal; Tubal ligation (09/01/1991); Cholecystectomy; Appendectomy; Tonsillectomy; Plantar fascia surgery (Right, 08/31/2010); stimulation system implant (08/31/2013); Colonoscopy (N/A, 01/28/2018); and LEFT TOTAL ARTHROPLASTY (Left, 07/22/2021).   Her family history includes Diabetes in her mother; Heart disease in her brother and father; Hypertension in her mother.She reports that she has never smoked. She has never used smokeless tobacco. She reports that she does not currently use alcohol. She reports that she does not use drugs.    ROS Review of Systems  Constitutional: Negative.   HENT: Negative.    Eyes:  Negative for visual disturbance.  Respiratory:   Negative for shortness of breath.   Cardiovascular:  Negative for chest pain.  Gastrointestinal:  Negative for abdominal pain.  Musculoskeletal:  Negative for arthralgias.    Objective:  BP 110/67   Pulse 80   Temp 97.6 F (36.4 C)   Ht '5\' 5"'$  (1.651 m)   Wt 206 lb 3.2 oz (93.5 kg)   SpO2 98%   BMI 34.31 kg/m   BP Readings from Last 3 Encounters:  11/12/22 110/67  10/05/22 123/74  09/15/22 106/66    Wt Readings from Last 3 Encounters:  11/12/22 206 lb 3.2 oz (93.5 kg)  10/05/22 205 lb 3.2 oz (93.1 kg)  09/15/22 202 lb 9.6 oz (91.9 kg)     Physical Exam Constitutional:      General: She is not in acute distress.    Appearance: She is well-developed.  Cardiovascular:     Rate and Rhythm: Normal rate and regular rhythm.  Pulmonary:     Breath sounds: Normal breath sounds.  Musculoskeletal:        General: Normal range of motion.  Skin:    General: Skin is warm and dry.  Neurological:     Mental Status: She is alert and oriented to person, place, and time.       Assessment & Plan:   Lauren Shannon was seen today for medical management of chronic issues.  Diagnoses and all orders for this visit:  Essential hypertension  Trigeminal neuralgia of left side of face  Other orders -     gabapentin (NEURONTIN) 300 MG capsule; Take 2 capsules with lunch and four with supper daily       I have discontinued Lauren Shannon's cyanocobalamin and cetirizine. I have also changed her gabapentin. Additionally, I am having her maintain her multivitamin, aspirin, Calcium Citrate-Vitamin D (CALCIUM + D PO), blood glucose meter kit and supplies, mirabegron ER, Melatonin, Trulicity, metoprolol succinate, alendronate, atorvastatin, levothyroxine, and promethazine-dextromethorphan.  Allergies as of 11/12/2022       Reactions   Oxybutynin Shortness Of Breath, Palpitations        Medication List        Accurate as of November 12, 2022  9:08 AM. If you have any questions, ask your  nurse or doctor.          STOP taking these medications    cetirizine 10 MG tablet Commonly known as: ZYRTEC Stopped by: [name], MD   cyanocobalamin 1000 MCG tablet Commonly known as: VITAMIN B12 Stopped by: [name], MD       TAKE these medications    alendronate 70 MG tablet Commonly known as: FOSAMAX TAKE 1 TABLET ONCE A WEEK ON AN EMPTY STOMACH   aspirin 81 MG tablet Take 81 mg by mouth every evening.   atorvastatin 40 MG tablet Commonly known as: LIPITOR Take 1 tablet (40 mg total) by mouth daily.   blood glucose meter kit and supplies Kit Dispense based on patient and insurance preference. Use up to four times daily as directed. (FOR ICD-10 : E11.9   CALCIUM + D PO Take 1 tablet by mouth daily.   gabapentin 300 MG capsule Commonly known as: NEURONTIN Take 2 capsules with lunch and four with supper daily What changed: additional instructions Changed by: [name], MD   levothyroxine 50 MCG tablet Commonly known as: SYNTHROID Take 1 tablet (50 mcg total) by mouth daily.   Melatonin 10 MG Subl Place under the tongue at bedtime.   metoprolol succinate 25 MG 24 hr tablet Commonly known as: Toprol XL Take 1 tablet (25 mg total) by mouth at bedtime.   mirabegron ER 50 MG Tb24 tablet Commonly known as: Myrbetriq Take 1 tablet (50 mg total) by mouth daily.   multivitamin capsule Take 1 capsule by mouth daily.   promethazine-dextromethorphan 6.25-15 MG/5ML syrup Commonly known as: PROMETHAZINE-DM Take 5 mLs by mouth 3 (three) times daily as needed for cough.   Trulicity 1.5 0000000 Sopn Generic drug: Dulaglutide Inject content of one pen under the skin weekly         Follow-up: Return in about 3 months (around 02/12/2023).  [name], M.D.

## 2022-11-17 ENCOUNTER — Encounter: Payer: Self-pay | Admitting: Cardiology

## 2022-11-17 ENCOUNTER — Ambulatory Visit: Payer: Medicare Other | Attending: Cardiology | Admitting: Cardiology

## 2022-11-17 VITALS — BP 94/60 | HR 83 | Ht 65.0 in | Wt 208.0 lb

## 2022-11-17 DIAGNOSIS — I10 Essential (primary) hypertension: Secondary | ICD-10-CM | POA: Diagnosis present

## 2022-11-17 DIAGNOSIS — R072 Precordial pain: Secondary | ICD-10-CM

## 2022-11-17 DIAGNOSIS — R002 Palpitations: Secondary | ICD-10-CM | POA: Diagnosis present

## 2022-11-17 DIAGNOSIS — E782 Mixed hyperlipidemia: Secondary | ICD-10-CM | POA: Diagnosis present

## 2022-11-17 NOTE — Patient Instructions (Signed)
Medication Instructions:   STOP ASPIRIN  *If you need a refill on your cardiac medications before your next appointment, please call your pharmacy*    Follow-Up: At [facility], you and your health needs are our priority.  As part of our continuing mission to provide you with exceptional heart care, we have created designated Provider Care Teams.  These Care Teams include your primary Cardiologist (physician) and Advanced Practice Providers (APPs -  Physician Assistants and Nurse Practitioners) who all work together to provide you with the care you need, when you need it.  We recommend signing up for the patient portal called "MyChart".  Sign up information is provided on this After Visit Summary.  MyChart is used to connect with patients for Virtual Visits (Telemedicine).  Patients are able to view lab/test results, encounter notes, upcoming appointments, etc.  Non-urgent messages can be sent to your provider as well.   To learn more about what you can do with MyChart, go to [email or web address].    Your next appointment:   12 month(s)  Provider:   Kirk Ruths, MD

## 2022-12-03 ENCOUNTER — Encounter: Payer: Self-pay | Admitting: Family Medicine

## 2022-12-21 ENCOUNTER — Encounter: Payer: Self-pay | Admitting: Family Medicine

## 2022-12-21 ENCOUNTER — Ambulatory Visit (INDEPENDENT_AMBULATORY_CARE_PROVIDER_SITE_OTHER): Payer: Medicare Other | Admitting: Family Medicine

## 2022-12-21 VITALS — BP 110/67 | HR 69 | Temp 97.7°F | Ht 65.0 in | Wt 205.6 lb

## 2022-12-21 DIAGNOSIS — I10 Essential (primary) hypertension: Secondary | ICD-10-CM | POA: Diagnosis not present

## 2022-12-21 DIAGNOSIS — E782 Mixed hyperlipidemia: Secondary | ICD-10-CM | POA: Diagnosis not present

## 2022-12-21 DIAGNOSIS — E039 Hypothyroidism, unspecified: Secondary | ICD-10-CM

## 2022-12-21 DIAGNOSIS — E119 Type 2 diabetes mellitus without complications: Secondary | ICD-10-CM | POA: Diagnosis not present

## 2022-12-21 DIAGNOSIS — Z7985 Long-term (current) use of injectable non-insulin antidiabetic drugs: Secondary | ICD-10-CM

## 2022-12-21 LAB — BAYER DCA HB A1C WAIVED: HB A1C (BAYER DCA - WAIVED): 5.8 % — ABNORMAL HIGH (ref 4.8–5.6)

## 2022-12-21 MED ORDER — MELOXICAM 7.5 MG PO TBDP: 7.5000 mg | ORAL_TABLET | Freq: Every day | ORAL | Status: AC

## 2022-12-21 NOTE — Progress Notes (Signed)
Subjective:  Patient ID: Lauren Shannon, female    DOB: November 13, 1955  Age: 67 y.o. MRN: [id number]  CC: Medical Management of Chronic Issues   HPI Lauren Shannon presents for stopped gabapentin due to swelling. Since then no trigeminal neuralgia pain, no swelling.   Trulicity is frequently on back order. Uses it when she can get it   follow-up on  thyroid. The patient has a history of hypothyroidism for many years. It has been stable recently. Pt. denies any change in  voice, loss of hair, heat or cold intolerance. Energy level has been adequate to good. Patient denies constipation and diarrhea. No myxedema. Medication is as noted below. Verified that pt is taking it daily on an empty stomach. Well tolerated.   in for follow-up of elevated cholesterol. Doing well without complaints on current medication. Denies side effects of statin including myalgia and arthralgia and nausea. Currently no chest pain, shortness of breath or other cardiovascular related symptoms noted.   presents for  follow-up of hypertension. Patient has no history of headache chest pain or shortness of breath or recent cough. Patient also denies symptoms of TIA such as focal numbness or weakness. Patient denies side effects from medication. States taking it regularly.       12/21/2022    2:23 PM 12/21/2022    2:15 PM 11/12/2022    8:28 AM  Depression screen PHQ 2/9  Decreased Interest 2 0 3  Down, Depressed, Hopeless 0 0 0  PHQ - 2 Score 2 0 3  Altered sleeping 2  1  Tired, decreased energy 2  1  Change in appetite 1  0  Feeling bad or failure about yourself  0  0  Trouble concentrating 0  0  Moving slowly or fidgety/restless 0  0  Suicidal thoughts 0  0  PHQ-9 Score 7  5  Difficult doing work/chores Somewhat difficult  Extremely dIfficult    History Lauren Shannon has a past medical history of Allergy, Arthritis, Basal cell carcinoma, COVID-19 (08/2020), Headache, History of pneumonia (1963), Low back pain, Mixed  hyperlipidemia, Routine gynecological examination (08/22/2013), and Type 2 diabetes mellitus.   She has a past surgical history that includes Thyroid surgery (09/01/2003); Uterine polyp removal; Tubal ligation (09/01/1991); Cholecystectomy; Appendectomy; Tonsillectomy; Plantar fascia surgery (Right, 08/31/2010); stimulation system implant (08/31/2013); Colonoscopy (N/A, 01/28/2018); and LEFT TOTAL ARTHROPLASTY (Left, 07/22/2021).   Her family history includes Diabetes in her mother; Heart disease in her brother and father; Hypertension in her mother.She reports that she has never smoked. She has never used smokeless tobacco. She reports that she does not currently use alcohol. She reports that she does not use drugs.    ROS Review of Systems  Constitutional: Negative.   HENT: Negative.    Eyes:  Negative for visual disturbance.  Respiratory:  Negative for shortness of breath.   Cardiovascular:  Negative for chest pain.  Gastrointestinal:  Negative for abdominal pain.  Musculoskeletal:  Negative for arthralgias.    Objective:  BP 110/67   Pulse 69   Temp 97.7 F (36.5 C)   Ht  (1.651 m)   Wt 205 lb 9.6 oz (93.3 kg)   SpO2 98%   BMI 34.21 kg/m   BP Readings from Last 3 Encounters:  12/21/22 110/67  11/17/22 94/60  11/12/22 110/67    Wt Readings from Last 3 Encounters:  12/21/22 205 lb 9.6 oz (93.3 kg)  11/17/22 208 lb (94.3 kg)  11/12/22 206 lb 3.2 oz (93.5 kg)  Physical Exam Constitutional:      General: She is not in acute distress.    Appearance: She is well-developed.  Cardiovascular:     Rate and Rhythm: Normal rate and regular rhythm.  Pulmonary:     Breath sounds: Normal breath sounds.  Musculoskeletal:        General: Normal range of motion.  Skin:    General: Skin is warm and dry.  Neurological:     Mental Status: She is alert and oriented to person, place, and time.       Assessment & Plan:   Lauren Shannon was seen today for medical management  of chronic issues.  Diagnoses and all orders for this visit:  Essential hypertension -     CBC with Differential/Platelet -     CMP14+EGFR  Hypothyroidism, unspecified type -     TSH + free T4  Mixed hyperlipidemia -     Lipid panel  Type 2 diabetes mellitus without complication, without long-term current use of insulin -     Bayer DCA Hb A1c Waived  Other orders -     Meloxicam 7.5 MG TBDP; Take 7.5 mg by mouth daily.       I have discontinued Lauren Shannon promethazine-dextromethorphan and gabapentin. I am also having her start on Meloxicam. Additionally, I am having her maintain her multivitamin, Calcium Citrate-Vitamin D (CALCIUM + D PO), blood glucose meter kit and supplies, mirabegron ER, Melatonin, Trulicity, metoprolol succinate, alendronate, atorvastatin, levothyroxine, and traMADol.  Allergies as of 12/21/2022       Reactions   Oxybutynin Shortness Of Breath, Palpitations        Medication List        Accurate as of December 21, 2022  2:50 PM. If you have any questions, ask your nurse or doctor.          STOP taking these medications    gabapentin 300 MG capsule Commonly known as: NEURONTIN Stopped by: Mechele Claude, MD   promethazine-dextromethorphan 6.25-15 MG/5ML syrup Commonly known as: PROMETHAZINE-DM Stopped by: Mechele Claude, MD       TAKE these medications    alendronate 70 MG tablet Commonly known as: FOSAMAX TAKE 1 TABLET ONCE A WEEK ON AN EMPTY STOMACH   atorvastatin 40 MG tablet Commonly known as: LIPITOR Take 1 tablet (40 mg total) by mouth daily.   blood glucose meter kit and supplies Kit Dispense based on patient and insurance preference. Use up to four times daily as directed. (FOR ICD-10 : E11.9   CALCIUM + D PO Take 1 tablet by mouth daily.   levothyroxine 50 MCG tablet Commonly known as: SYNTHROID Take 1 tablet (50 mcg total) by mouth daily.   Melatonin 10 MG Subl Place under the tongue at bedtime.   Meloxicam  7.5 MG Tbdp Take 7.5 mg by mouth daily. Started by: Mechele Claude, MD   metoprolol succinate 25 MG 24 hr tablet Commonly known as: Toprol XL Take 1 tablet (25 mg total) by mouth at bedtime.   mirabegron ER 50 MG Tb24 tablet Commonly known as: Myrbetriq Take 1 tablet (50 mg total) by mouth daily.   multivitamin capsule Take 1 capsule by mouth daily.   traMADol 50 MG tablet Commonly known as: ULTRAM Take 50 mg by mouth every 6 (six) hours as needed for severe pain.   Trulicity 1.5 MG/0.5ML Sopn Generic drug: Dulaglutide Inject content of one pen under the skin weekly         Follow-up: Return in about  6 months (around 06/22/2023).  Mechele Claude, M.D.

## 2022-12-22 LAB — CBC WITH DIFFERENTIAL/PLATELET
Basophils Absolute: 0 10*3/uL (ref 0.0–0.2)
Basos: 1 %
EOS (ABSOLUTE): 0.1 10*3/uL (ref 0.0–0.4)
Eos: 2 %
Hematocrit: 43.3 % (ref 34.0–46.6)
Hemoglobin: 14.1 g/dL (ref 11.1–15.9)
Immature Grans (Abs): 0 10*3/uL (ref 0.0–0.1)
Immature Granulocytes: 0 %
Lymphocytes Absolute: 1.2 10*3/uL (ref 0.7–3.1)
Lymphs: 29 %
MCH: 31 pg (ref 26.6–33.0)
MCHC: 32.6 g/dL (ref 31.5–35.7)
MCV: 95 fL (ref 79–97)
Monocytes Absolute: 0.6 10*3/uL (ref 0.1–0.9)
Monocytes: 14 %
Neutrophils Absolute: 2.2 10*3/uL (ref 1.4–7.0)
Neutrophils: 54 %
Platelets: 222 10*3/uL (ref 150–450)
RBC: 4.55 x10E6/uL (ref 3.77–5.28)
RDW: 11.9 % (ref 11.7–15.4)
WBC: 4.1 10*3/uL (ref 3.4–10.8)

## 2022-12-22 LAB — CMP14+EGFR
ALT: 24 IU/L (ref 0–32)
AST: 21 IU/L (ref 0–40)
Albumin/Globulin Ratio: 2.1 (ref 1.2–2.2)
Albumin: 4.2 g/dL (ref 3.9–4.9)
Alkaline Phosphatase: 53 IU/L (ref 44–121)
BUN/Creatinine Ratio: 30 — ABNORMAL HIGH (ref 12–28)
BUN: 19 mg/dL (ref 8–27)
Bilirubin Total: 0.6 mg/dL (ref 0.0–1.2)
CO2: 24 mmol/L (ref 20–29)
Calcium: 9.2 mg/dL (ref 8.7–10.3)
Chloride: 104 mmol/L (ref 96–106)
Creatinine, Ser: 0.64 mg/dL (ref 0.57–1.00)
Globulin, Total: 2 g/dL (ref 1.5–4.5)
Glucose: 105 mg/dL — ABNORMAL HIGH (ref 70–99)
Potassium: 4.5 mmol/L (ref 3.5–5.2)
Sodium: 140 mmol/L (ref 134–144)
Total Protein: 6.2 g/dL (ref 6.0–8.5)
eGFR: 97 mL/min/{1.73_m2} (ref >59)

## 2022-12-22 LAB — LIPID PANEL
Chol/HDL Ratio: 2.1 ratio (ref 0.0–4.4)
Cholesterol, Total: 140 mg/dL (ref 100–199)
HDL: 68 mg/dL (ref >39)
LDL Chol Calc (NIH): 49 mg/dL (ref 0–99)
Triglycerides: 135 mg/dL (ref 0–149)
VLDL Cholesterol Cal: 23 mg/dL (ref 5–40)

## 2022-12-22 LAB — TSH+FREE T4
Free T4: 1.23 ng/dL (ref 0.82–1.77)
TSH: 0.571 u[IU]/mL (ref 0.450–4.500)

## 2022-12-22 NOTE — Progress Notes (Signed)
Hello  Darcella,    Your lab result is normal and/or stable.Some minor variations that are not significant are commonly marked abnormal, but do not represent any medical problem for you.   Best regards,  Kenley Rettinger, M.D.

## 2023-02-02 ENCOUNTER — Ambulatory Visit: Payer: Medicare Other | Admitting: Family Medicine

## 2023-02-03 ENCOUNTER — Ambulatory Visit: Payer: Medicare Other

## 2023-05-04 ENCOUNTER — Other Ambulatory Visit: Payer: Self-pay | Admitting: Cardiology

## 2023-05-04 DIAGNOSIS — R002 Palpitations: Secondary | ICD-10-CM

## 2023-05-05 ENCOUNTER — Ambulatory Visit: Payer: Medicare Other | Attending: Internal Medicine | Admitting: Audiologist

## 2023-05-05 DIAGNOSIS — H903 Sensorineural hearing loss, bilateral: Secondary | ICD-10-CM | POA: Insufficient documentation

## 2023-05-05 NOTE — Procedures (Signed)
  Outpatient Audiology and [facility] [address] [place], [place]  [address] [phone]  AUDIOLOGICAL  EVALUATION  NAME: Lauren Shannon     DOB:   01-21-1956      MRN: [id number]                                                                                     DATE: 05/05/2023     REFERENT: No primary care provider on file. STATUS: Outpatient DIAGNOSIS: Sensorineural Hearing Loss    History: Baya was seen for an audiological evaluation.  [name] is receiving a hearing evaluation due to hearing concerns brought to her attention by her husband. [name] has difficulty hearing the TV. This difficulty began gradually. No pain or pressure reported in either ear. Tinnitus brief in each ears, lasting a few second. Medical history shows no warning signs for hearing loss. No other relevant case history reported.   Evaluation:  Otoscopy showed a clear view of the tympanic membranes, bilaterally. Nonoccluded cerumen present in left ear. Tympanometry results were consistent with normal middle ear function, bilaterally   Audiometric testing was completed using conventional audiometry with supraural transducer. Speech Recognition Thresholds were 30dB in the right ear and 25dB in the left ear. Word Recognition was performed 40dB SL, scored 100% in the right ear and 100% in the left ear. Pure tone thresholds shows normal sloping to mild sensorineural hearing loss bilaterally.   Results:  The test results were reviewed with [name]. [name] has a mild high frequency sensorineural hearing loss. She has good word recognition. She does not yet notice the loss on a daily basis. She was given a list of local hearing aid providers for the future, and a copy of her audiogram. [name] a handout on Debrox for the left ear.  Recommendations: 1.   No further audiologic testing is needed unless future hearing concerns arise. Monitor hearing and have repeat test in two years, given list of local hearing  aid providers as at this time she will likely be a candidate.   27 minutes spent testing and counseling on results.   [name]  Audiologist, Au.D., CCC-A 05/05/2023  8:26 AM  Cc: Gilman Schmidt NP

## 2023-05-20 ENCOUNTER — Other Ambulatory Visit: Payer: Self-pay | Admitting: Obstetrics & Gynecology

## 2023-05-20 DIAGNOSIS — Z1231 Encounter for screening mammogram for malignant neoplasm of breast: Secondary | ICD-10-CM

## 2023-06-05 ENCOUNTER — Other Ambulatory Visit: Payer: Self-pay | Admitting: Family Medicine

## 2023-06-05 DIAGNOSIS — E039 Hypothyroidism, unspecified: Secondary | ICD-10-CM

## 2023-06-22 ENCOUNTER — Ambulatory Visit: Payer: Medicare Other | Admitting: Family Medicine

## 2023-06-30 ENCOUNTER — Ambulatory Visit: Payer: Medicare Other

## 2023-06-30 DIAGNOSIS — Z1231 Encounter for screening mammogram for malignant neoplasm of breast: Secondary | ICD-10-CM

## 2023-08-20 ENCOUNTER — Other Ambulatory Visit: Payer: Self-pay | Admitting: Family Medicine

## 2023-08-20 DIAGNOSIS — M810 Age-related osteoporosis without current pathological fracture: Secondary | ICD-10-CM

## 2023-08-23 ENCOUNTER — Other Ambulatory Visit: Payer: Self-pay | Admitting: Family Medicine

## 2023-08-23 DIAGNOSIS — M810 Age-related osteoporosis without current pathological fracture: Secondary | ICD-10-CM

## 2023-09-07 ENCOUNTER — Other Ambulatory Visit: Payer: Self-pay | Admitting: Family Medicine

## 2023-09-07 DIAGNOSIS — E119 Type 2 diabetes mellitus without complications: Secondary | ICD-10-CM

## 2023-09-07 DIAGNOSIS — E782 Mixed hyperlipidemia: Secondary | ICD-10-CM

## 2023-10-08 IMAGING — MG MM DIGITAL SCREENING BILAT W/ TOMO AND CAD
8 series · 8 of 24 positions shown · non-contrast
Comparison: Previous exam(s).

CLINICAL DATA: Screening.

EXAM:
DIGITAL SCREENING BILATERAL MAMMOGRAM WITH TOMOSYNTHESIS AND CAD
TECHNIQUE: Bilateral screening digital craniocaudal and mediolateral oblique
mammograms were obtained. Bilateral screening digital breast
tomosynthesis was performed. The images were evaluated with
computer-aided detection.

[L MLO synth-2D]
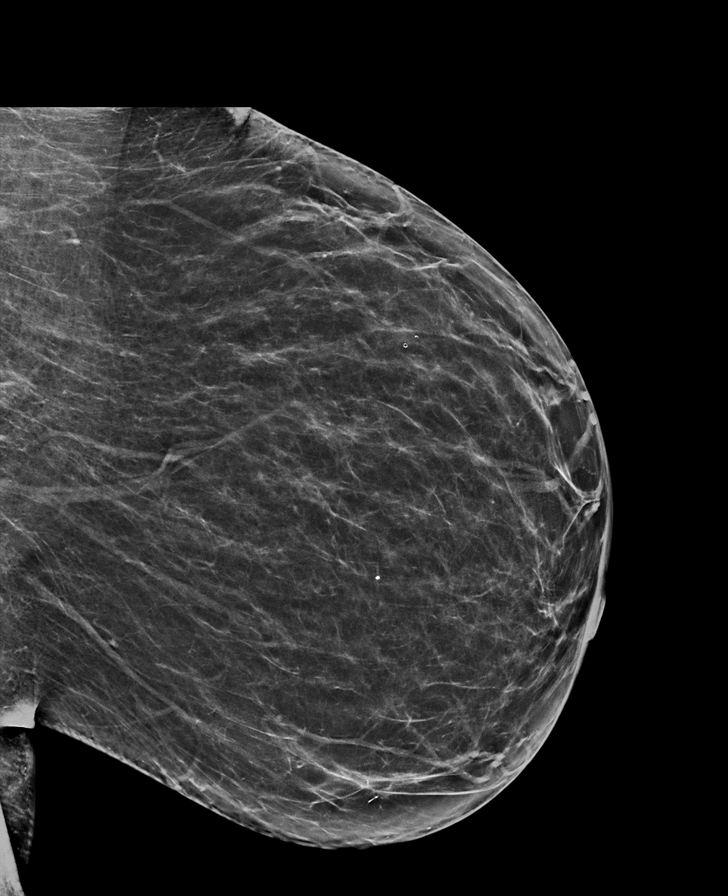

[R CC synth-2D]
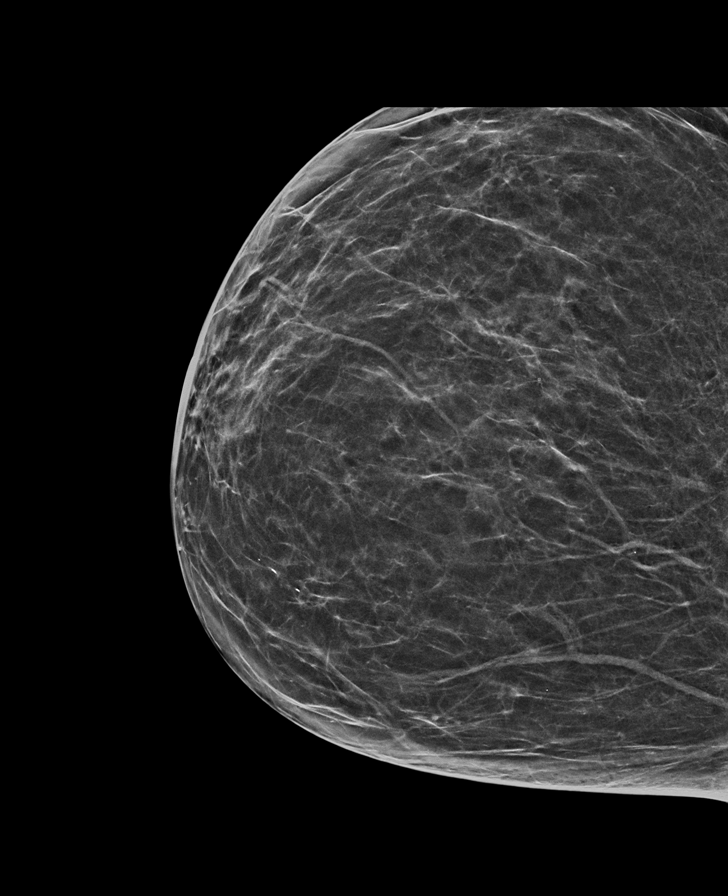

[R MLO synth-2D]
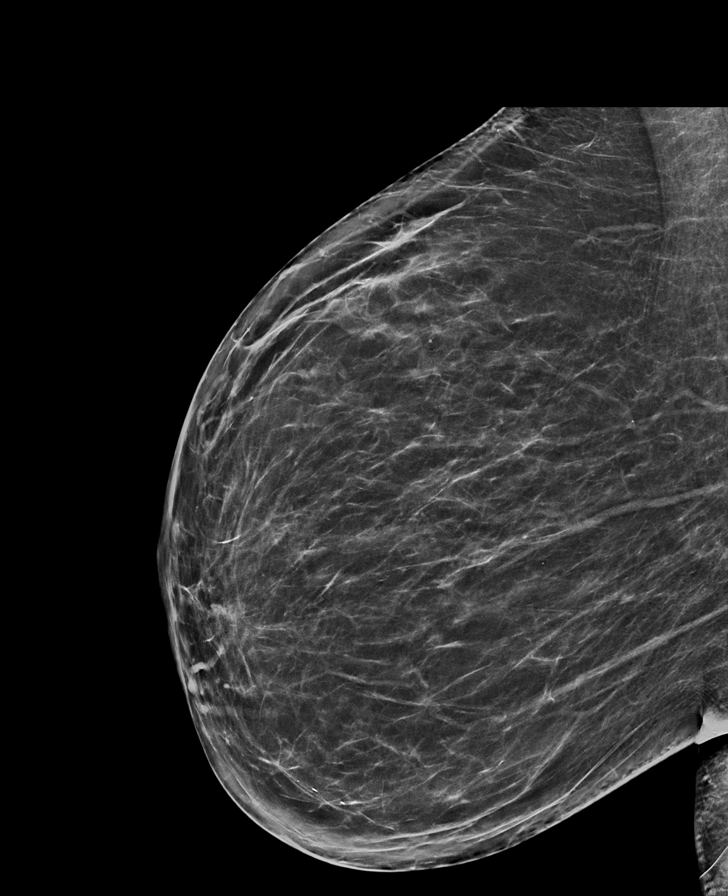

[L CC synth-2D]
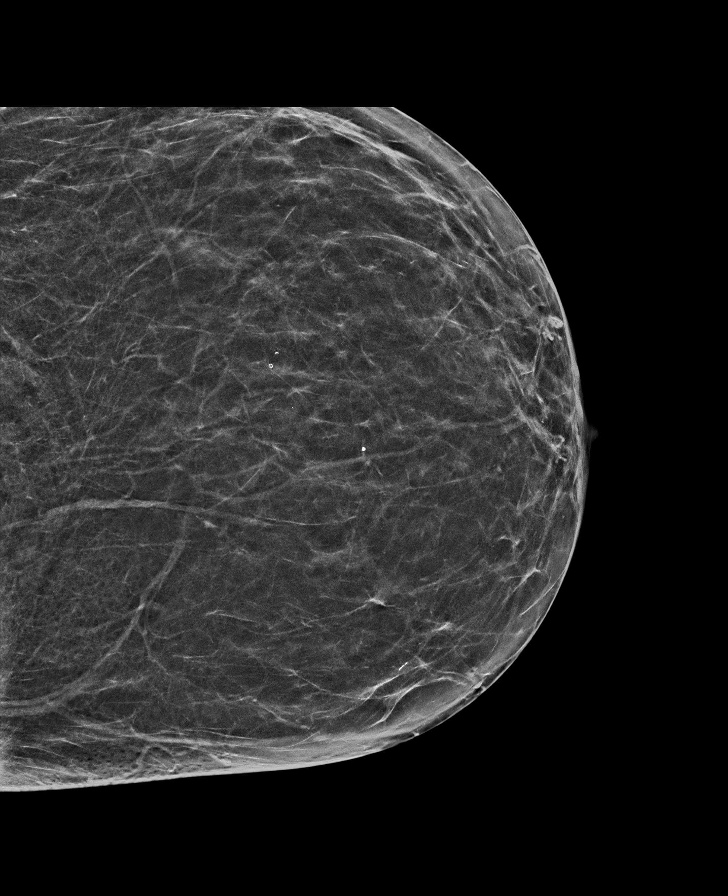

[L CC tomo · tomo slice 33/65.0]
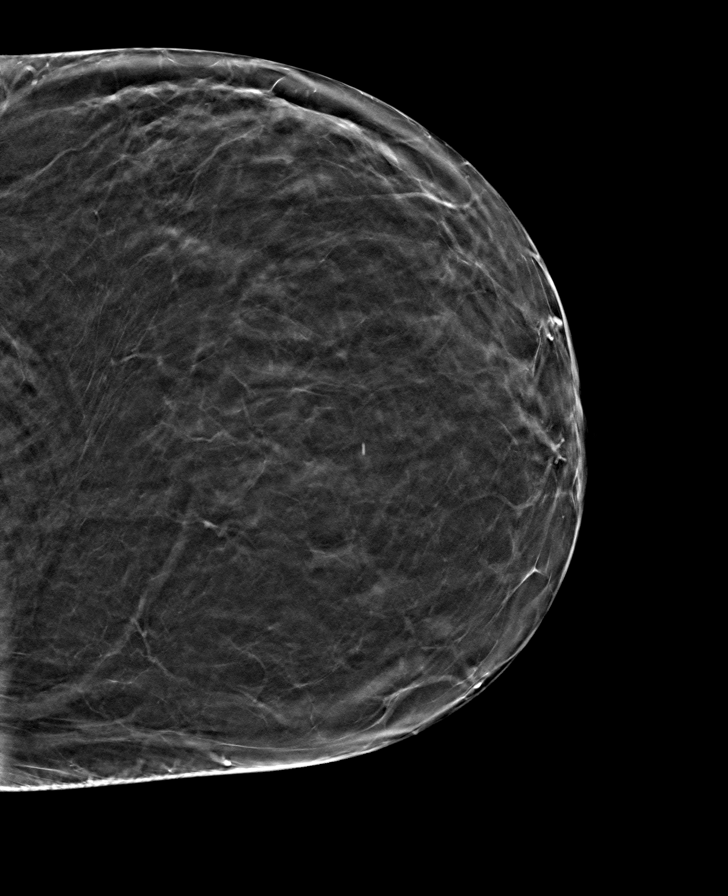

[L MLO tomo · tomo slice 37/72.0]
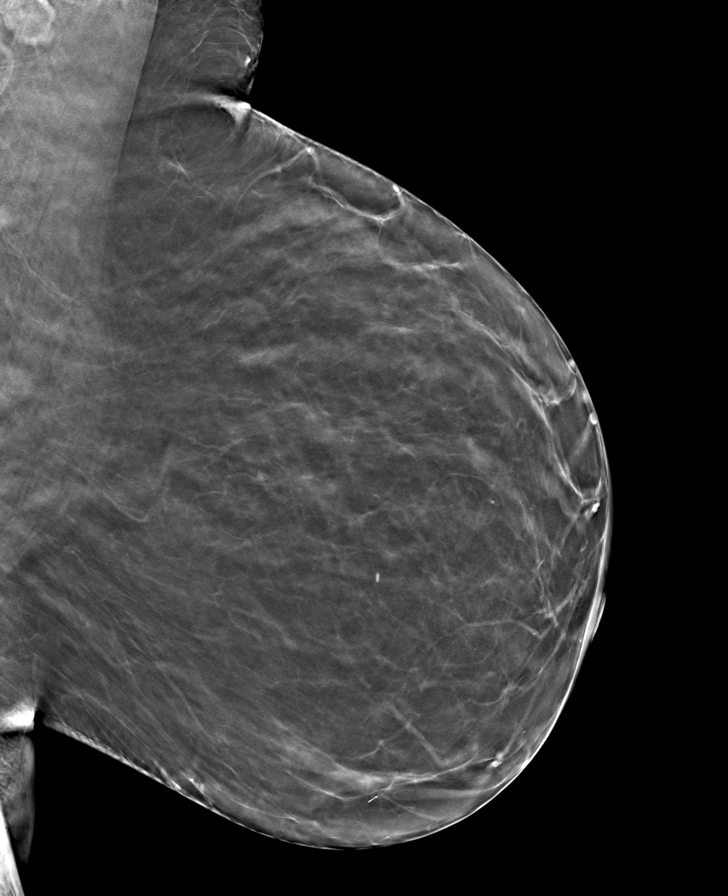

[R CC tomo · tomo slice 33/65.0]
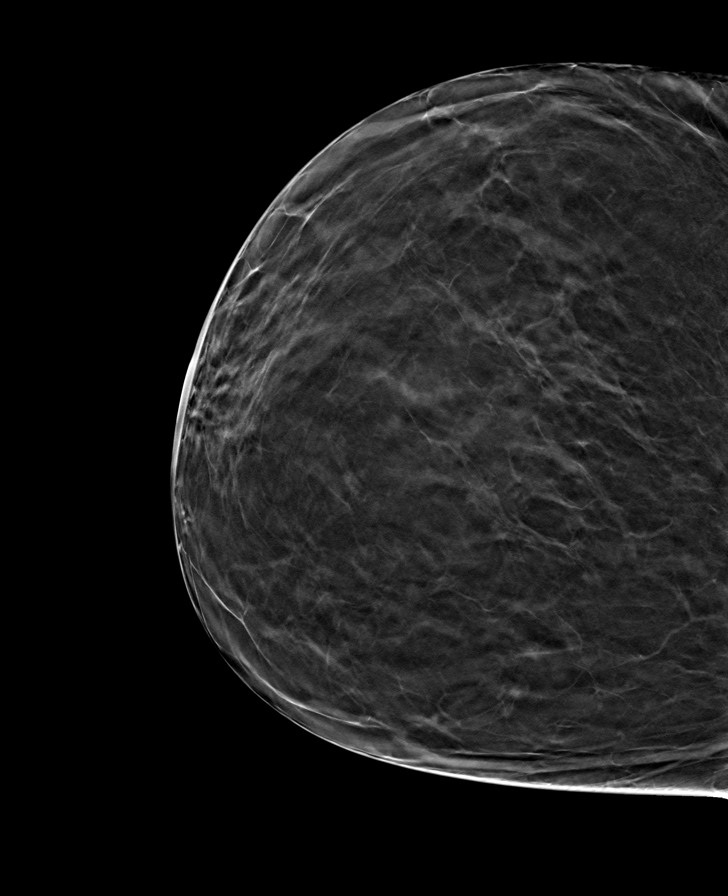

[R MLO tomo · tomo slice 37/72.0]
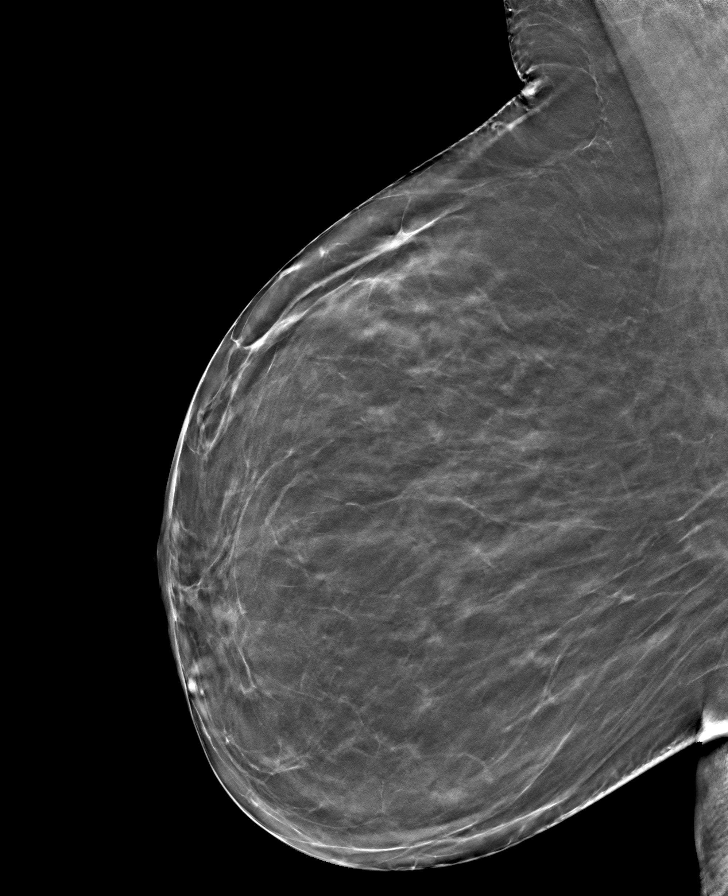

[8 of 24 positions shown; findings below may reference images not displayed]

ACR Breast Density Category b: There are scattered areas of
fibroglandular density.
FINDINGS: There are no findings suspicious for malignancy.
IMPRESSION: No mammographic evidence of malignancy. A result letter of this
screening mammogram will be mailed directly to the patient.

RECOMMENDATION:
Screening mammogram in one year. (Code:51-O-LD2)

BI-RADS CATEGORY  1: Negative.

## 2023-12-06 ENCOUNTER — Telehealth: Payer: Self-pay | Admitting: Cardiology

## 2023-12-06 ENCOUNTER — Encounter: Payer: Self-pay | Admitting: Otolaryngology

## 2023-12-06 NOTE — Telephone Encounter (Signed)
 Error

## 2023-12-06 NOTE — Telephone Encounter (Signed)
 Pt would like to switch to Dr. Antoine Poche to be closer to [facility] area, is this switch okay?

## 2024-02-04 ENCOUNTER — Ambulatory Visit: Payer: PRIVATE HEALTH INSURANCE | Admitting: Cardiology

## 2024-02-14 ENCOUNTER — Ambulatory Visit: Payer: PRIVATE HEALTH INSURANCE | Admitting: Cardiology

## 2024-02-14 NOTE — Progress Notes (Signed)
  Cardiology Office Note:   Date:  02/16/2024  ID:  Lauren Shannon, DOB Mar 21, 1956, MRN [id number] PCP: Renato Dorothey HERO, NP  [facility] Providers Cardiologist:  Redell Shallow, MD {  History of Present Illness:   Lauren Shannon is a 68 y.o. female who was previously seen by Dr. Shallow.   He saw her for chest pain and palpitations.  Echocardiogram September 2023 showed ejection fraction 60 to 65%.  Coronary CTA October 2023 showed calcium  score 0 with no coronary disease.    She has done well since she was last seen.  She rarely gets palpitations.  She does not have any chest pressure, neck or arm discomfort.  She does not have any shortness of breath, PND or orthopnea.  She is not really feeling any palpitations, presyncope or syncope.  She walks the dogs.  She works in Recruitment consultant at [employer].  She has not exercising routinely however.  With this level of activity she has no cardiovascular complaints.   ROS: As stated in the HPI and negative for all other systems.  Studies Reviewed:    EKG:   EKG Interpretation Date/Time:  Wednesday February 16 2024 13:43:40 EDT Ventricular Rate:  73 PR Interval:  152 QRS Duration:  94 QT Interval:  392 QTC Calculation: 431 R Axis:   17  Text Interpretation: Normal sinus rhythm RSR' or QR pattern in V1 suggests right ventricular conduction delay No significant change since last tracing 2023 Confirmed by Lavona Agent ([id number]) on 02/16/2024 2:15:09 PM     Risk Assessment/Calculations:              Physical Exam:   VS:  BP 114/72   Pulse 73   Ht 5' 5 (1.651 m)   Wt 215 lb (97.5 kg)   BMI 35.78 kg/m    Wt Readings from Last 3 Encounters:  02/16/24 215 lb (97.5 kg)  12/21/22 205 lb 9.6 oz (93.3 kg)  11/17/22 208 lb (94.3 kg)     GEN: Well nourished, well developed in no acute distress NECK: No JVD; No carotid bruits CARDIAC: RRR, no murmurs, rubs, gallops RESPIRATORY:  Clear to auscultation without rales, wheezing or rhonchi   ABDOMEN: Soft, non-tender, non-distended EXTREMITIES:  No edema; No deformity   ASSESSMENT AND PLAN:   Chest pain-patient: Patient has nonanginal chest pain.  No change in therapy.   Hypertension: Blood pressure is controlled.  Continue meds as listed.   Hyperlipidemia: This is managed by Renato Dorothey HERO, NP.  The A1c was 5.8.  No change in therapy.  Palpitations:   These are managed with a low-dose beta-blocker.  No change in therapy.    Follow up with me as needed  Signed, Agent Lavona, MD

## 2024-02-16 ENCOUNTER — Ambulatory Visit (INDEPENDENT_AMBULATORY_CARE_PROVIDER_SITE_OTHER): Payer: PRIVATE HEALTH INSURANCE | Admitting: Cardiology

## 2024-02-16 ENCOUNTER — Encounter: Payer: Self-pay | Admitting: Cardiology

## 2024-02-16 VITALS — BP 114/72 | HR 73 | Ht 65.0 in | Wt 215.0 lb

## 2024-02-16 DIAGNOSIS — R072 Precordial pain: Secondary | ICD-10-CM

## 2024-02-16 DIAGNOSIS — E785 Hyperlipidemia, unspecified: Secondary | ICD-10-CM

## 2024-02-16 DIAGNOSIS — I10 Essential (primary) hypertension: Secondary | ICD-10-CM

## 2024-02-16 DIAGNOSIS — R002 Palpitations: Secondary | ICD-10-CM

## 2024-02-16 NOTE — Patient Instructions (Signed)
 Medication Instructions:  Your physician recommends that you continue on your current medications as directed. Please refer to the Current Medication list given to you today.  *If you need a refill on your cardiac medications before your next appointment, please call your pharmacy*  Lab Work: NONE   If you have labs (blood work) drawn today and your tests are completely normal, you will receive your results only by: MyChart Message (if you have MyChart) OR A paper copy in the mail If you have any lab test that is abnormal or we need to change your treatment, we will call you to review the results.  Testing/Procedures: NONE   Follow-Up: At [facility], you and your health needs are our priority.  As part of our continuing mission to provide you with exceptional heart care, our providers are all part of one team.  This team includes your primary Cardiologist (physician) and Advanced Practice Providers or APPs (Physician Assistants and Nurse Practitioners) who all work together to provide you with the care you need, when you need it.  Your next appointment:    As Needed   Provider:   Eilleen Grates, MD    We recommend signing up for the patient portal called "MyChart".  Sign up information is provided on this After Visit Summary.  MyChart is used to connect with patients for Virtual Visits (Telemedicine).  Patients are able to view lab/test results, encounter notes, upcoming appointments, etc.  Non-urgent messages can be sent to your provider as well.   To learn more about what you can do with MyChart, go to [email or web address].   Other Instructions Thank you for choosing [facility]!

## 2024-02-24 ENCOUNTER — Encounter: Payer: Self-pay | Admitting: Obstetrics & Gynecology

## 2024-02-24 ENCOUNTER — Ambulatory Visit: Payer: PRIVATE HEALTH INSURANCE | Admitting: Obstetrics & Gynecology

## 2024-02-24 ENCOUNTER — Other Ambulatory Visit (HOSPITAL_COMMUNITY)
Admission: RE | Admit: 2024-02-24 | Discharge: 2024-02-24 | Disposition: A | Discharge status: Home / Self Care | Source: Ambulatory Visit | Attending: Obstetrics & Gynecology | Admitting: Obstetrics & Gynecology

## 2024-02-24 VITALS — BP 107/71 | HR 73 | Ht 65.0 in | Wt 216.0 lb

## 2024-02-24 DIAGNOSIS — Z01419 Encounter for gynecological examination (general) (routine) without abnormal findings: Secondary | ICD-10-CM | POA: Diagnosis not present

## 2024-02-24 DIAGNOSIS — Z1151 Encounter for screening for human papillomavirus (HPV): Secondary | ICD-10-CM | POA: Diagnosis not present

## 2024-02-24 NOTE — Addendum Note (Signed)
 Addended by: ILEAN RUTHERFORD HERO on: 02/24/2024 10:37 AM   Modules accepted: Orders

## 2024-02-24 NOTE — Progress Notes (Signed)
 Subjective:     Lauren Aldape is a 68 y.o. female here for a routine exam.  No LMP recorded. Patient is postmenopausal. [id number] Birth Control Method:  PM Menstrual Calendar(currently): amenorrhea  Current complaints: none.   Current acute medical issues:  none   Recent Gynecologic History No LMP recorded. Patient is postmenopausal. Last Pap: 2022,  normal Last mammogram: 06/20/2023,  normal  Past Medical History:  Diagnosis Date   Allergy    Arthritis    ostoarthritis   Basal cell carcinoma    COVID-19 08/2020   Headache    History of pneumonia 1963   Low back pain    Mixed hyperlipidemia    Routine gynecological examination 08/22/2013   Type 2 diabetes mellitus (HCC)    pt stated not currently being treated for this    Past Surgical History:  Procedure Laterality Date   APPENDECTOMY     CHOLECYSTECTOMY     COLONOSCOPY N/A 01/28/2018   Procedure: COLONOSCOPY;  Surgeon: Harvey Margo CROME, MD;  Location: [facility];  Service: Endoscopy;  Laterality: N/A;  1:00   LEFT TOTAL ARTHROPLASTY Left 07/22/2021   PLANTAR FASCIA SURGERY Right 08/31/2010   stimulation system implant  08/31/2013   THYROID  SURGERY  09/01/2003   TONSILLECTOMY     TUBAL LIGATION  09/01/1991   Uterine polyp removal      OB History     Gravida  4   Para      Term      Preterm      AB  2   Living  2      SAB      IAB  2   Ectopic      Multiple      Live Births              Social History   Socioeconomic History   Marital status: Married    Spouse name: JIM   Number of children: 2   Years of education: Not on file   Highest education level: Master's degree (e.g., MA, MS, MEng, MEd, MSW, MBA)  Occupational History   Not on file  Tobacco Use   Smoking status: Never   Smokeless tobacco: Never  Vaping Use   Vaping status: Never Used  Substance and Sexual Activity   Alcohol use: Not Currently   Drug use: No   Sexual activity: Not Currently    Partners: Male  Other  Topics Concern   Not on file  Social History Narrative   Not on file   Social Drivers of Health   Financial Resource Strain: Low Risk  (02/24/2024)   Overall Financial Resource Strain (CARDIA)    Difficulty of Paying Living Expenses: Not hard at all  Food Insecurity: No Food Insecurity (02/24/2024)   Hunger Vital Sign    Worried About Running Out of Food in the Last Year: Never true    Ran Out of Food in the Last Year: Never true  Transportation Needs: No Transportation Needs (02/24/2024)   PRAPARE - Administrator, Civil Service (Medical): No    Lack of Transportation (Non-Medical): No  Physical Activity: Inactive (02/24/2024)   Exercise Vital Sign    Days of Exercise per Week: 0 days    Minutes of Exercise per Session: 0 min  Stress: No Stress Concern Present (02/24/2024)   [employer] of Occupational Health - Occupational Stress Questionnaire    Feeling of Stress: Not at all  Social Connections: Socially Integrated (  02/24/2024)   Social Connection and Isolation Panel    Frequency of Communication with Friends and Family: Three times a week    Frequency of Social Gatherings with Friends and Family: Three times a week    Attends Religious Services: More than 4 times per year    Active Member of Clubs or Organizations: Yes    Attends Engineer, structural: More than 4 times per year    Marital Status: Married    Family History  Problem Relation Age of Onset   Diabetes Mother    Hypertension Mother    Heart disease Father    Heart disease Brother    Colon cancer Neg Hx    Colon polyps Neg Hx      Current Outpatient Medications:    alendronate  (FOSAMAX ) 70 MG tablet, TAKE 1 TABLET ONCE A WEEK ON AN EMPTY STOMACH, Disp: 13 tablet, Rfl: 3   atorvastatin  (LIPITOR) 40 MG tablet, Take 1 tablet (40 mg total) by mouth daily., Disp: 90 tablet, Rfl: 3   Calcium  Citrate-Vitamin D  (CALCIUM  + D PO), Take 1 tablet by mouth daily., Disp: , Rfl:    levothyroxine   (SYNTHROID ) 50 MCG tablet, Take 1 tablet (50 mcg total) by mouth daily., Disp: 90 tablet, Rfl: 1   Melatonin 10 MG SUBL, Place under the tongue at bedtime., Disp: , Rfl:    Meloxicam  7.5 MG TBDP, Take 7.5 mg by mouth daily., Disp: 30 tablet, Rfl:    metoprolol  succinate (TOPROL -XL) 25 MG 24 hr tablet, TAKE ONE TABLET AT BEDTIME, Disp: 90 tablet, Rfl: 3   Multiple Vitamin (MULTIVITAMIN) capsule, Take 1 capsule by mouth daily., Disp: , Rfl:    traMADol (ULTRAM) 50 MG tablet, Take 50 mg by mouth every 6 (six) hours as needed for severe pain., Disp: , Rfl:   Review of Systems  Review of Systems  Constitutional: Negative for fever, chills, weight loss, malaise/fatigue and diaphoresis.  HENT: Negative for hearing loss, ear pain, nosebleeds, congestion, sore throat, neck pain, tinnitus and ear discharge.   Eyes: Negative for blurred vision, double vision, photophobia, pain, discharge and redness.  Respiratory: Negative for cough, hemoptysis, sputum production, shortness of breath, wheezing and stridor.   Cardiovascular: Negative for chest pain, palpitations, orthopnea, claudication, leg swelling and PND.  Gastrointestinal: negative for abdominal pain. Negative for heartburn, nausea, vomiting, diarrhea, constipation, blood in stool and melena.  Genitourinary: Negative for dysuria, urgency, frequency, hematuria and flank pain.  Musculoskeletal: Negative for myalgias, back pain, joint pain and falls.  Skin: Negative for itching and rash.  Neurological: Negative for dizziness, tingling, tremors, sensory change, speech change, focal weakness, seizures, loss of consciousness, weakness and headaches.  Endo/Heme/Allergies: Negative for environmental allergies and polydipsia. Does not bruise/bleed easily.  Psychiatric/Behavioral: Negative for depression, suicidal ideas, hallucinations, memory loss and substance abuse. The patient is not nervous/anxious and does not have insomnia.        Objective:  Blood  pressure 107/71, pulse 73, height 5' 5 (1.651 m), weight 216 lb (98 kg).   Physical Exam  Vitals reviewed. Constitutional: She is oriented to person, place, and time. She appears well-developed and well-nourished.  HENT:  Head: Normocephalic and atraumatic.        Right Ear: External ear normal.  Left Ear: External ear normal.  Nose: Nose normal.  Mouth/Throat: Oropharynx is clear and moist.  Eyes: Conjunctivae and EOM are normal. Pupils are equal, round, and reactive to light. Right eye exhibits no discharge. Left eye exhibits no discharge.  No scleral icterus.  Neck: Normal range of motion. Neck supple. No tracheal deviation present. No thyromegaly present.  Cardiovascular: Normal rate, regular rhythm, normal heart sounds and intact distal pulses.  Exam reveals no gallop and no friction rub.   No murmur heard. Respiratory: Effort normal and breath sounds normal. No respiratory distress. She has no wheezes. She has no rales. She exhibits no tenderness.  GI: Soft. Bowel sounds are normal. She exhibits no distension and no mass. There is no tenderness. There is no rebound and no guarding.  Genitourinary:  Breasts no masses skin changes or nipple changes bilaterally      Vulva is normal without lesions Vagina is pink moist without discharge Cervix normal in appearance and pap is done Uterus is normal size shape and contour Adnexa is negative with normal sized ovaries   Musculoskeletal: Normal range of motion. She exhibits no edema and no tenderness.  Neurological: She is alert and oriented to person, place, and time. She has normal reflexes. She displays normal reflexes. No cranial nerve deficit. She exhibits normal muscle tone. Coordination normal.  Skin: Skin is warm and dry. No rash noted. No erythema. No pallor.  Psychiatric: She has a normal mood and affect. Her behavior is normal. Judgment and thought content normal.       Medications Ordered at today's visit: No orders of the  defined types were placed in this encounter.   Other orders placed at today's visit: No orders of the defined types were placed in this encounter.    ASSESSMENT + PLAN:    ICD-10-CM   1. Well woman exam with routine gynecological exam  Z01.419           Return if symptoms worsen or fail to improve.

## 2024-02-25 LAB — CYTOLOGY - PAP
Comment: NEGATIVE
Diagnosis: NEGATIVE
High risk HPV: NEGATIVE

## 2024-08-10 ENCOUNTER — Other Ambulatory Visit: Payer: Self-pay | Admitting: Internal Medicine

## 2024-08-10 DIAGNOSIS — Z1231 Encounter for screening mammogram for malignant neoplasm of breast: Secondary | ICD-10-CM

## 2024-08-17 ENCOUNTER — Encounter

## 2024-08-17 DIAGNOSIS — Z1231 Encounter for screening mammogram for malignant neoplasm of breast: Secondary | ICD-10-CM | POA: Diagnosis not present
